# Patient Record
Sex: Male | Born: 1940
Health system: Southern US, Community
[De-identification: ages and names within clinical notes are randomized; demographics above are authoritative.]

## PROBLEM LIST (undated history)

## (undated) ENCOUNTER — Emergency Department (HOSPITAL_COMMUNITY): Payer: 59

## (undated) DIAGNOSIS — E1140 Type 2 diabetes mellitus with diabetic neuropathy, unspecified: Secondary | ICD-10-CM

## (undated) DIAGNOSIS — F5104 Psychophysiologic insomnia: Secondary | ICD-10-CM

## (undated) DIAGNOSIS — J309 Allergic rhinitis, unspecified: Secondary | ICD-10-CM

## (undated) DIAGNOSIS — E119 Type 2 diabetes mellitus without complications: Secondary | ICD-10-CM

## (undated) DIAGNOSIS — N1832 Chronic kidney disease, stage 3b: Secondary | ICD-10-CM

## (undated) DIAGNOSIS — I10 Essential (primary) hypertension: Secondary | ICD-10-CM

## (undated) DIAGNOSIS — I639 Cerebral infarction, unspecified: Secondary | ICD-10-CM

## (undated) DIAGNOSIS — E291 Testicular hypofunction: Secondary | ICD-10-CM

## (undated) DIAGNOSIS — E785 Hyperlipidemia, unspecified: Secondary | ICD-10-CM

## (undated) DIAGNOSIS — M545 Low back pain, unspecified: Secondary | ICD-10-CM

## (undated) DIAGNOSIS — Z87442 Personal history of urinary calculi: Secondary | ICD-10-CM

## (undated) DIAGNOSIS — Z01810 Encounter for preprocedural cardiovascular examination: Secondary | ICD-10-CM

## (undated) DIAGNOSIS — F419 Anxiety disorder, unspecified: Secondary | ICD-10-CM

## (undated) DIAGNOSIS — R600 Localized edema: Secondary | ICD-10-CM

## (undated) DIAGNOSIS — G20A1 Parkinson's disease without dyskinesia, without mention of fluctuations: Secondary | ICD-10-CM

## (undated) DIAGNOSIS — G8929 Other chronic pain: Secondary | ICD-10-CM

## (undated) DIAGNOSIS — N400 Enlarged prostate without lower urinary tract symptoms: Secondary | ICD-10-CM

## (undated) DIAGNOSIS — E7800 Pure hypercholesterolemia, unspecified: Secondary | ICD-10-CM

## (undated) DIAGNOSIS — I679 Cerebrovascular disease, unspecified: Secondary | ICD-10-CM

## (undated) DIAGNOSIS — J189 Pneumonia, unspecified organism: Secondary | ICD-10-CM

## (undated) DIAGNOSIS — K219 Gastro-esophageal reflux disease without esophagitis: Secondary | ICD-10-CM

## (undated) DIAGNOSIS — N390 Urinary tract infection, site not specified: Secondary | ICD-10-CM

## (undated) DIAGNOSIS — E1121 Type 2 diabetes mellitus with diabetic nephropathy: Secondary | ICD-10-CM

## (undated) DIAGNOSIS — N529 Male erectile dysfunction, unspecified: Secondary | ICD-10-CM

## (undated) DIAGNOSIS — M1990 Unspecified osteoarthritis, unspecified site: Secondary | ICD-10-CM

## (undated) DIAGNOSIS — G20 Parkinson's disease: Secondary | ICD-10-CM

## (undated) DIAGNOSIS — C4491 Basal cell carcinoma of skin, unspecified: Secondary | ICD-10-CM

## (undated) DIAGNOSIS — I2541 Coronary artery aneurysm: Secondary | ICD-10-CM

## (undated) DIAGNOSIS — M549 Dorsalgia, unspecified: Secondary | ICD-10-CM

## (undated) DIAGNOSIS — I2510 Atherosclerotic heart disease of native coronary artery without angina pectoris: Secondary | ICD-10-CM

## (undated) HISTORY — DX: Encounter for preprocedural cardiovascular examination: Z01.810

## (undated) HISTORY — DX: Dorsalgia, unspecified: M54.9

## (undated) HISTORY — DX: Unspecified osteoarthritis, unspecified site: M19.90

## (undated) HISTORY — DX: Benign prostatic hyperplasia without lower urinary tract symptoms: N40.0

## (undated) HISTORY — DX: Type 2 diabetes mellitus with diabetic nephropathy: E11.21

## (undated) HISTORY — DX: Essential (primary) hypertension: I10

## (undated) HISTORY — DX: Chronic kidney disease, stage 3b: N18.32

## (undated) HISTORY — PX: KNEE SURGERY: SHX244

## (undated) HISTORY — DX: Allergic rhinitis, unspecified: J30.9

## (undated) HISTORY — DX: Parkinson's disease: G20

## (undated) HISTORY — PX: TONSILLECTOMY: SUR1361

## (undated) HISTORY — PX: OTHER SURGICAL HISTORY: SHX169

## (undated) HISTORY — PX: HERNIA REPAIR: SHX51

## (undated) HISTORY — PX: APPENDECTOMY: SHX54

## (undated) HISTORY — DX: Parkinson's disease without dyskinesia, without mention of fluctuations: G20.A1

## (undated) HISTORY — DX: Type 2 diabetes mellitus with diabetic neuropathy, unspecified: E11.40

## (undated) HISTORY — DX: Low back pain, unspecified: M54.50

## (undated) HISTORY — DX: Testicular hypofunction: E29.1

## (undated) HISTORY — DX: Hyperlipidemia, unspecified: E78.5

## (undated) HISTORY — DX: Cerebrovascular disease, unspecified: I67.9

## (undated) HISTORY — DX: Other chronic pain: G89.29

## (undated) HISTORY — DX: Pure hypercholesterolemia, unspecified: E78.00

## (undated) HISTORY — DX: Atherosclerotic heart disease of native coronary artery without angina pectoris: I25.10

## (undated) HISTORY — DX: Male erectile dysfunction, unspecified: N52.9

## (undated) HISTORY — DX: Anxiety disorder, unspecified: F41.9

## (undated) HISTORY — PX: KIDNEY STONE SURGERY: SHX686

## (undated) HISTORY — DX: Basal cell carcinoma of skin, unspecified: C44.91

## (undated) HISTORY — DX: Type 2 diabetes mellitus without complications: E11.9

## (undated) HISTORY — DX: Gastro-esophageal reflux disease without esophagitis: K21.9

## (undated) HISTORY — DX: Psychophysiologic insomnia: F51.04

## (undated) HISTORY — DX: Localized edema: R60.0

## (undated) HISTORY — DX: Cerebral infarction, unspecified: I63.9

---

## 2000-08-04 DIAGNOSIS — I2541 Coronary artery aneurysm: Secondary | ICD-10-CM

## 2000-08-04 DIAGNOSIS — I639 Cerebral infarction, unspecified: Secondary | ICD-10-CM | POA: Insufficient documentation

## 2000-08-04 DIAGNOSIS — Z8673 Personal history of transient ischemic attack (TIA), and cerebral infarction without residual deficits: Secondary | ICD-10-CM

## 2000-08-04 HISTORY — DX: Cerebral infarction, unspecified: I63.9

## 2000-08-04 HISTORY — DX: Coronary artery aneurysm: I25.41

## 2000-08-04 HISTORY — PX: BRAIN SURGERY: SHX531

## 2000-08-04 HISTORY — DX: Personal history of transient ischemic attack (TIA), and cerebral infarction without residual deficits: Z86.73

## 2001-01-08 ENCOUNTER — Ambulatory Visit (HOSPITAL_COMMUNITY): Admission: RE | Admit: 2001-01-08 | Discharge: 2001-01-08 | Payer: Self-pay

## 2001-01-13 ENCOUNTER — Inpatient Hospital Stay (HOSPITAL_COMMUNITY)
Admission: RE | Admit: 2001-01-13 | Discharge: 2001-01-25 | Payer: Self-pay | Admitting: Physical Medicine & Rehabilitation

## 2001-01-15 ENCOUNTER — Encounter: Payer: Self-pay | Admitting: Physical Medicine & Rehabilitation

## 2001-03-28 ENCOUNTER — Ambulatory Visit (HOSPITAL_COMMUNITY): Admission: RE | Admit: 2001-03-28 | Discharge: 2001-03-28 | Payer: Self-pay

## 2006-08-04 HISTORY — PX: ROTATOR CUFF REPAIR: SHX139

## 2011-12-26 DIAGNOSIS — R509 Fever, unspecified: Secondary | ICD-10-CM | POA: Diagnosis not present

## 2011-12-26 DIAGNOSIS — N12 Tubulo-interstitial nephritis, not specified as acute or chronic: Secondary | ICD-10-CM | POA: Diagnosis not present

## 2012-01-05 DIAGNOSIS — Z8051 Family history of malignant neoplasm of kidney: Secondary | ICD-10-CM | POA: Diagnosis not present

## 2012-01-05 DIAGNOSIS — N401 Enlarged prostate with lower urinary tract symptoms: Secondary | ICD-10-CM | POA: Diagnosis not present

## 2012-01-05 DIAGNOSIS — N402 Nodular prostate without lower urinary tract symptoms: Secondary | ICD-10-CM | POA: Diagnosis not present

## 2012-01-05 DIAGNOSIS — N12 Tubulo-interstitial nephritis, not specified as acute or chronic: Secondary | ICD-10-CM | POA: Diagnosis not present

## 2012-01-08 DIAGNOSIS — N201 Calculus of ureter: Secondary | ICD-10-CM | POA: Diagnosis not present

## 2012-01-08 DIAGNOSIS — R109 Unspecified abdominal pain: Secondary | ICD-10-CM | POA: Diagnosis not present

## 2012-01-09 DIAGNOSIS — N201 Calculus of ureter: Secondary | ICD-10-CM | POA: Diagnosis not present

## 2012-01-09 DIAGNOSIS — N1330 Unspecified hydronephrosis: Secondary | ICD-10-CM | POA: Diagnosis not present

## 2012-01-09 DIAGNOSIS — R109 Unspecified abdominal pain: Secondary | ICD-10-CM | POA: Diagnosis not present

## 2012-01-14 DIAGNOSIS — N201 Calculus of ureter: Secondary | ICD-10-CM | POA: Diagnosis not present

## 2012-01-14 DIAGNOSIS — R109 Unspecified abdominal pain: Secondary | ICD-10-CM | POA: Diagnosis not present

## 2012-01-30 DIAGNOSIS — R109 Unspecified abdominal pain: Secondary | ICD-10-CM | POA: Diagnosis not present

## 2012-01-30 DIAGNOSIS — N201 Calculus of ureter: Secondary | ICD-10-CM | POA: Diagnosis not present

## 2012-05-26 DIAGNOSIS — I2510 Atherosclerotic heart disease of native coronary artery without angina pectoris: Secondary | ICD-10-CM | POA: Diagnosis not present

## 2012-05-26 DIAGNOSIS — E7800 Pure hypercholesterolemia, unspecified: Secondary | ICD-10-CM | POA: Diagnosis not present

## 2012-05-26 DIAGNOSIS — Z23 Encounter for immunization: Secondary | ICD-10-CM | POA: Diagnosis not present

## 2012-05-26 DIAGNOSIS — M25559 Pain in unspecified hip: Secondary | ICD-10-CM | POA: Diagnosis not present

## 2012-05-26 DIAGNOSIS — K219 Gastro-esophageal reflux disease without esophagitis: Secondary | ICD-10-CM | POA: Diagnosis not present

## 2012-05-26 DIAGNOSIS — Z6830 Body mass index (BMI) 30.0-30.9, adult: Secondary | ICD-10-CM | POA: Diagnosis not present

## 2012-06-28 DIAGNOSIS — E119 Type 2 diabetes mellitus without complications: Secondary | ICD-10-CM | POA: Diagnosis not present

## 2012-06-28 DIAGNOSIS — I2510 Atherosclerotic heart disease of native coronary artery without angina pectoris: Secondary | ICD-10-CM | POA: Diagnosis not present

## 2012-06-28 DIAGNOSIS — E7800 Pure hypercholesterolemia, unspecified: Secondary | ICD-10-CM | POA: Diagnosis not present

## 2012-06-28 DIAGNOSIS — Z6830 Body mass index (BMI) 30.0-30.9, adult: Secondary | ICD-10-CM | POA: Diagnosis not present

## 2012-07-06 DIAGNOSIS — J209 Acute bronchitis, unspecified: Secondary | ICD-10-CM | POA: Diagnosis not present

## 2012-07-06 DIAGNOSIS — I2510 Atherosclerotic heart disease of native coronary artery without angina pectoris: Secondary | ICD-10-CM | POA: Diagnosis not present

## 2012-07-06 DIAGNOSIS — Z6830 Body mass index (BMI) 30.0-30.9, adult: Secondary | ICD-10-CM | POA: Diagnosis not present

## 2012-07-06 DIAGNOSIS — E119 Type 2 diabetes mellitus without complications: Secondary | ICD-10-CM | POA: Diagnosis not present

## 2012-07-30 DIAGNOSIS — Z6830 Body mass index (BMI) 30.0-30.9, adult: Secondary | ICD-10-CM | POA: Diagnosis not present

## 2012-07-30 DIAGNOSIS — E119 Type 2 diabetes mellitus without complications: Secondary | ICD-10-CM | POA: Diagnosis not present

## 2012-07-30 DIAGNOSIS — M25559 Pain in unspecified hip: Secondary | ICD-10-CM | POA: Diagnosis not present

## 2012-07-30 DIAGNOSIS — E7800 Pure hypercholesterolemia, unspecified: Secondary | ICD-10-CM | POA: Diagnosis not present

## 2012-07-30 DIAGNOSIS — I2510 Atherosclerotic heart disease of native coronary artery without angina pectoris: Secondary | ICD-10-CM | POA: Diagnosis not present

## 2012-10-21 DIAGNOSIS — N401 Enlarged prostate with lower urinary tract symptoms: Secondary | ICD-10-CM | POA: Diagnosis not present

## 2012-10-21 DIAGNOSIS — I10 Essential (primary) hypertension: Secondary | ICD-10-CM | POA: Diagnosis not present

## 2012-11-03 DIAGNOSIS — L578 Other skin changes due to chronic exposure to nonionizing radiation: Secondary | ICD-10-CM | POA: Diagnosis not present

## 2012-11-03 DIAGNOSIS — C4441 Basal cell carcinoma of skin of scalp and neck: Secondary | ICD-10-CM | POA: Diagnosis not present

## 2012-11-10 DIAGNOSIS — C4441 Basal cell carcinoma of skin of scalp and neck: Secondary | ICD-10-CM | POA: Diagnosis not present

## 2012-11-11 DIAGNOSIS — M48061 Spinal stenosis, lumbar region without neurogenic claudication: Secondary | ICD-10-CM | POA: Diagnosis not present

## 2012-11-29 DIAGNOSIS — L570 Actinic keratosis: Secondary | ICD-10-CM | POA: Diagnosis not present

## 2013-01-18 DIAGNOSIS — E7800 Pure hypercholesterolemia, unspecified: Secondary | ICD-10-CM | POA: Diagnosis not present

## 2013-01-18 DIAGNOSIS — E119 Type 2 diabetes mellitus without complications: Secondary | ICD-10-CM | POA: Diagnosis not present

## 2013-01-18 DIAGNOSIS — K219 Gastro-esophageal reflux disease without esophagitis: Secondary | ICD-10-CM | POA: Diagnosis not present

## 2013-01-18 DIAGNOSIS — M25559 Pain in unspecified hip: Secondary | ICD-10-CM | POA: Diagnosis not present

## 2013-01-18 DIAGNOSIS — I2510 Atherosclerotic heart disease of native coronary artery without angina pectoris: Secondary | ICD-10-CM | POA: Diagnosis not present

## 2013-01-18 DIAGNOSIS — I10 Essential (primary) hypertension: Secondary | ICD-10-CM | POA: Diagnosis not present

## 2013-02-10 DIAGNOSIS — M779 Enthesopathy, unspecified: Secondary | ICD-10-CM | POA: Diagnosis not present

## 2013-02-10 DIAGNOSIS — M2040 Other hammer toe(s) (acquired), unspecified foot: Secondary | ICD-10-CM | POA: Diagnosis not present

## 2013-02-28 DIAGNOSIS — D235 Other benign neoplasm of skin of trunk: Secondary | ICD-10-CM | POA: Diagnosis not present

## 2013-02-28 DIAGNOSIS — L570 Actinic keratosis: Secondary | ICD-10-CM | POA: Diagnosis not present

## 2013-03-04 DIAGNOSIS — Z6831 Body mass index (BMI) 31.0-31.9, adult: Secondary | ICD-10-CM | POA: Diagnosis not present

## 2013-03-04 DIAGNOSIS — Z1331 Encounter for screening for depression: Secondary | ICD-10-CM | POA: Diagnosis not present

## 2013-03-04 DIAGNOSIS — E119 Type 2 diabetes mellitus without complications: Secondary | ICD-10-CM | POA: Diagnosis not present

## 2013-03-04 DIAGNOSIS — Z9181 History of falling: Secondary | ICD-10-CM | POA: Diagnosis not present

## 2013-03-04 DIAGNOSIS — E7800 Pure hypercholesterolemia, unspecified: Secondary | ICD-10-CM | POA: Diagnosis not present

## 2013-03-04 DIAGNOSIS — I2510 Atherosclerotic heart disease of native coronary artery without angina pectoris: Secondary | ICD-10-CM | POA: Diagnosis not present

## 2013-03-11 DIAGNOSIS — E7800 Pure hypercholesterolemia, unspecified: Secondary | ICD-10-CM | POA: Diagnosis not present

## 2013-03-11 DIAGNOSIS — I2510 Atherosclerotic heart disease of native coronary artery without angina pectoris: Secondary | ICD-10-CM | POA: Diagnosis not present

## 2013-03-11 DIAGNOSIS — Z6831 Body mass index (BMI) 31.0-31.9, adult: Secondary | ICD-10-CM | POA: Diagnosis not present

## 2013-03-11 DIAGNOSIS — I6789 Other cerebrovascular disease: Secondary | ICD-10-CM | POA: Diagnosis not present

## 2013-03-11 DIAGNOSIS — M25559 Pain in unspecified hip: Secondary | ICD-10-CM | POA: Diagnosis not present

## 2013-03-16 DIAGNOSIS — E1149 Type 2 diabetes mellitus with other diabetic neurological complication: Secondary | ICD-10-CM | POA: Diagnosis not present

## 2013-03-16 DIAGNOSIS — R269 Unspecified abnormalities of gait and mobility: Secondary | ICD-10-CM | POA: Diagnosis not present

## 2013-03-16 DIAGNOSIS — M2040 Other hammer toe(s) (acquired), unspecified foot: Secondary | ICD-10-CM | POA: Diagnosis not present

## 2013-03-16 DIAGNOSIS — M779 Enthesopathy, unspecified: Secondary | ICD-10-CM | POA: Diagnosis not present

## 2013-03-18 DIAGNOSIS — E7800 Pure hypercholesterolemia, unspecified: Secondary | ICD-10-CM | POA: Diagnosis not present

## 2013-03-18 DIAGNOSIS — I2510 Atherosclerotic heart disease of native coronary artery without angina pectoris: Secondary | ICD-10-CM | POA: Diagnosis not present

## 2013-03-18 DIAGNOSIS — K219 Gastro-esophageal reflux disease without esophagitis: Secondary | ICD-10-CM | POA: Diagnosis not present

## 2013-03-18 DIAGNOSIS — Z6830 Body mass index (BMI) 30.0-30.9, adult: Secondary | ICD-10-CM | POA: Diagnosis not present

## 2013-03-18 DIAGNOSIS — M25569 Pain in unspecified knee: Secondary | ICD-10-CM | POA: Diagnosis not present

## 2013-04-20 DIAGNOSIS — Z6830 Body mass index (BMI) 30.0-30.9, adult: Secondary | ICD-10-CM | POA: Diagnosis not present

## 2013-04-20 DIAGNOSIS — I2510 Atherosclerotic heart disease of native coronary artery without angina pectoris: Secondary | ICD-10-CM | POA: Diagnosis not present

## 2013-04-20 DIAGNOSIS — E7800 Pure hypercholesterolemia, unspecified: Secondary | ICD-10-CM | POA: Diagnosis not present

## 2013-04-20 DIAGNOSIS — K219 Gastro-esophageal reflux disease without esophagitis: Secondary | ICD-10-CM | POA: Diagnosis not present

## 2013-05-20 DIAGNOSIS — M25559 Pain in unspecified hip: Secondary | ICD-10-CM | POA: Diagnosis not present

## 2013-05-20 DIAGNOSIS — K219 Gastro-esophageal reflux disease without esophagitis: Secondary | ICD-10-CM | POA: Diagnosis not present

## 2013-05-20 DIAGNOSIS — E7800 Pure hypercholesterolemia, unspecified: Secondary | ICD-10-CM | POA: Diagnosis not present

## 2013-05-20 DIAGNOSIS — I2510 Atherosclerotic heart disease of native coronary artery without angina pectoris: Secondary | ICD-10-CM | POA: Diagnosis not present

## 2013-05-20 DIAGNOSIS — Z6830 Body mass index (BMI) 30.0-30.9, adult: Secondary | ICD-10-CM | POA: Diagnosis not present

## 2013-05-20 DIAGNOSIS — Z23 Encounter for immunization: Secondary | ICD-10-CM | POA: Diagnosis not present

## 2013-05-25 ENCOUNTER — Encounter (INDEPENDENT_AMBULATORY_CARE_PROVIDER_SITE_OTHER): Payer: Self-pay

## 2013-05-25 ENCOUNTER — Ambulatory Visit (INDEPENDENT_AMBULATORY_CARE_PROVIDER_SITE_OTHER): Payer: Managed Care, Other (non HMO)

## 2013-05-25 VITALS — BP 130/80 | HR 109 | Resp 16

## 2013-05-25 DIAGNOSIS — E1140 Type 2 diabetes mellitus with diabetic neuropathy, unspecified: Secondary | ICD-10-CM

## 2013-05-25 DIAGNOSIS — M2040 Other hammer toe(s) (acquired), unspecified foot: Secondary | ICD-10-CM

## 2013-05-25 DIAGNOSIS — E1149 Type 2 diabetes mellitus with other diabetic neurological complication: Secondary | ICD-10-CM

## 2013-05-25 DIAGNOSIS — Q828 Other specified congenital malformations of skin: Secondary | ICD-10-CM

## 2013-05-25 DIAGNOSIS — E1142 Type 2 diabetes mellitus with diabetic polyneuropathy: Secondary | ICD-10-CM

## 2013-05-25 DIAGNOSIS — M2170 Unequal limb length (acquired), unspecified site: Secondary | ICD-10-CM

## 2013-05-25 DIAGNOSIS — R269 Unspecified abnormalities of gait and mobility: Secondary | ICD-10-CM

## 2013-05-25 NOTE — Progress Notes (Signed)
  Subjective:    Patient ID: Micheal [name], male    DOB: 1940-09-18, 72 y.o.   MRN: [id number]  Diabetes He presents for his follow-up diabetic visit. He has type 2 diabetes mellitus. No MedicAlert identification noted.   patient presents for followup and pick up and dispensing of diabetic accident shoes and custom molded insoles with lifts for left foot. Patient does have limited discrepancy left side, with abnormality gait dropfoot deformity.    Review of Systems  Constitutional: Negative.   HENT: Negative.   Eyes: Negative.   Respiratory: Negative.   Cardiovascular: Negative.   Gastrointestinal: Negative.   Endocrine: Negative.   Genitourinary: Negative.   Musculoskeletal: Positive for back pain.  Skin: Negative.   Allergic/Immunologic: Negative.   Neurological: Negative.   Hematological: Bruises/bleeds easily.  Psychiatric/Behavioral: Negative.        Objective:   Physical Exam  Vitals reviewed. Constitutional: He is oriented to person, place, and time. He appears well-developed and well-nourished.  Cardiovascular:  Pulses:      Dorsalis pedis pulses are 2+ on the right side, and 2+ on the left side.       Posterior tibial pulses are 1+ on the right side, and 1+ on the left side.  Capillary refill 3 seconds all digits. Skin temperature warm. Turgor normal. No edema varicosities noted. No open wounds or ulcerations the current time to  Musculoskeletal:  Biomechanical exam bilateral reveals the cavovarus type foot type with digital contractures and clawing contractures of toes as abnormality and gait bilateral with limb length discrepancy left leg being shorter than the right by approximately half inch to an inch. Patient is history of back problems back pain.  Neurological: He is alert and oriented to person, place, and time. He displays atrophy. He exhibits abnormal muscle tone.  Neurologically epicritic sensations are diminished on Semmes Weinstein testing to the  toes and forefoot bilateral. There is normal plantar response bilateral. DTRs not listed. There is abnormal gait with decreased muscle tone left side and dropfoot deformity left side.  Skin: Skin is warm and dry. No cyanosis. Nails show no clubbing.  Skin color and pigment normal hair growth absent bilateral. Nails somewhat thickened and criptotic with discoloration. No open wounds or current ulcerations noted. History of keratoses secondary deformity and gait abnormalities.  Psychiatric: He has a normal mood and affect. His behavior is normal.          Assessment & Plan:  Diabetes with peripheral neuropathy and orthopedic abnormalities/biomechanical deformities. Only discrepancy left side being shorter than the right. At this time dispensed 1 pair extra-depth shoes 3 pairs of custom molded dual density Plastizote inlays. Also half-inch a heel lift is applied to the left shoes. The stenosis with patient's gait. Patient is advised to use and use of orthoses and shoes with a slow steady break in period. Followup in one to 2 months for reevaluation and possible adjustments of the insoles and shoes. Advised to monitor for any keratoses or shoe irritation.  Harriet Masson DPM

## 2013-05-25 NOTE — Patient Instructions (Signed)

## 2013-06-22 DIAGNOSIS — Z6831 Body mass index (BMI) 31.0-31.9, adult: Secondary | ICD-10-CM | POA: Diagnosis not present

## 2013-06-22 DIAGNOSIS — I2510 Atherosclerotic heart disease of native coronary artery without angina pectoris: Secondary | ICD-10-CM | POA: Diagnosis not present

## 2013-06-22 DIAGNOSIS — Z23 Encounter for immunization: Secondary | ICD-10-CM | POA: Diagnosis not present

## 2013-06-22 DIAGNOSIS — E7800 Pure hypercholesterolemia, unspecified: Secondary | ICD-10-CM | POA: Diagnosis not present

## 2013-06-22 DIAGNOSIS — I6789 Other cerebrovascular disease: Secondary | ICD-10-CM | POA: Diagnosis not present

## 2013-07-12 DIAGNOSIS — E119 Type 2 diabetes mellitus without complications: Secondary | ICD-10-CM | POA: Diagnosis not present

## 2013-07-20 ENCOUNTER — Ambulatory Visit (INDEPENDENT_AMBULATORY_CARE_PROVIDER_SITE_OTHER): Payer: Managed Care, Other (non HMO)

## 2013-07-20 VITALS — BP 137/81 | HR 90 | Resp 18

## 2013-07-20 DIAGNOSIS — R269 Unspecified abnormalities of gait and mobility: Secondary | ICD-10-CM | POA: Diagnosis not present

## 2013-07-20 DIAGNOSIS — E1142 Type 2 diabetes mellitus with diabetic polyneuropathy: Secondary | ICD-10-CM

## 2013-07-20 DIAGNOSIS — M2170 Unequal limb length (acquired), unspecified site: Secondary | ICD-10-CM

## 2013-07-20 DIAGNOSIS — E1149 Type 2 diabetes mellitus with other diabetic neurological complication: Secondary | ICD-10-CM

## 2013-07-20 DIAGNOSIS — E1140 Type 2 diabetes mellitus with diabetic neuropathy, unspecified: Secondary | ICD-10-CM

## 2013-07-20 DIAGNOSIS — M1990 Unspecified osteoarthritis, unspecified site: Secondary | ICD-10-CM

## 2013-07-20 MED ORDER — MELOXICAM 15 MG PO TABS: 15.0000 mg | ORAL_TABLET | Freq: Every day | ORAL | Status: DC

## 2013-07-20 NOTE — Progress Notes (Signed)
   Subjective:    Patient ID: Micheal [name], male    DOB: November 22, 1940, 72 y.o.   MRN: [id number]  HPI These shoes are hurting me now for about a month and when I wear them it hurts my knees and I cant sleep at night and and use a ice and hot pack and If I wear them for more than one day I am in the bed the next day    Review of Systems no changes at this time     Objective:   Physical Exam Neurovascular status is intact and unchanged DP postal for PT one over 4 bilateral Refill time 3 seconds all digits patient is having some pain last 2-3 weeks we started wearing his new diabetic shoes affecting his knee is a limb length discrepancy proxy [place] half-inch left being shorter than right. At this time is not having pain in his feet in fact the pain is affecting his knees and legs with his walking ambulation. However explained the patient with the coconut winter weather 7 may also be a factor in causing symptomology. Patient is also certain whether has been taking his Mobic for meloxicam for arthritis.      Assessment & Plan:  No irritation to the foot from the shoes however having some difficulty with adjustment of the stiffness of the shoe changes and gait with a new shoe and the left did provide extra length to prevent 2 bearings other shoes recycling from a lifting shoe to a flat shoe causes strain on his knees. Patient advised to give it one more time for break-in break in instruction sheet is given for orthotics and shoes at this time for him to follow. A new prescription for Mobic or meloxicam is also issued should he run out of his prescription. Followup in one month he continues to have pain in symptomology.  Harriet Masson DPM

## 2013-07-20 NOTE — Patient Instructions (Signed)
WEARING INSTRUCTIONS FOR ORTHOTICS or new diabetic shoes  Don't expect to be comfortable wearing your orthotic devices for the first time.  Like eyeglasses, you may be aware of them as time passes, they will not be uncomfortable and you will enjoy wearing them.  FOLLOW THESE INSTRUCTIONS EXACTLY!  1. Wear your orthotic devices for:       Not more than 1 hour the first day.       Not more than 2 hours the second day.       Not more than 3 hours the third day and so on.        Or wear them for as long as they feel comfortable.       If you experience discomfort in your feet or legs take them out.  When feet & legs feel       better, put them back in.  You do need to be consistent and wear them a little        everyday. 2.   If at any time the orthotic devices become acutely uncomfortable before the       time for that particular day, STOP WEARING THEM. 3.   On the next day, do not increase the wearing time. 4.   Subsequently, increase the wearing time by 15-30 minutes only if comfortable to do       so. 5.   You will be seen by your doctor about 2-4 weeks after you receive your orthotic       devices, at which time you will probably be wearing your devices comfortably        for about 8 hours or more a day. 6.   Some patients occasionally report mild aches or discomfort in other parts of the of       body such as the knees, hips or back after 3 or 4 consecutive hours of wear.  If this       is the case with you, do not extend your wearing time.  Instead, cut it back an hour or       two.  In all likelihood, these symptoms will disappear in a short period of time as your       body posture realigns itself and functions more efficiently. 7.   It is possible that your orthotic device may require some small changes or adjustment       to improve their function or make them more comfortable.   This is usually not done       before one to three months have elapsed.  These adjustments are made in         accordance with the changed position your feet are assuming as a result of       improved biomechanical function. 8.   In women's shoes, it's not unusual for your heel to slip out of the shoe, particularly if       they are step-in-shoes.  If this is the case, try other shoes or other styles.  Try to       purchase shoes which have deeper heal seats or higher heel counters. 9.   Squeaking of orthotics devices in the shoes is due to the movement of the devices       when they are functioning normally.  To eliminate squeaking, simply dust some       baby powder into your shoes before inserting the devices.  If this does not work,  apply soap or wax to the edges of the orthotic devices or put a tissue into the shoes. 10. It is important that you follow these directions explicitly.  Failure to do so will simply       prolong the adjustment period or create problems which are easily avoided.  It makes       no difference if you are wearing your orthotic devices for only a few hours after        several months, so long as you are wearing them comfortably for those hours. 11. If you have any questions or complaints, contact our office.  We have no way of       knowing about your problems unless you tell us.  If we do not hear from you, we will       assume that you are proceeding well.  

## 2013-08-03 DIAGNOSIS — M545 Low back pain: Secondary | ICD-10-CM | POA: Diagnosis not present

## 2013-08-03 DIAGNOSIS — M1990 Unspecified osteoarthritis, unspecified site: Secondary | ICD-10-CM | POA: Diagnosis not present

## 2013-08-03 DIAGNOSIS — E7800 Pure hypercholesterolemia, unspecified: Secondary | ICD-10-CM | POA: Diagnosis not present

## 2013-08-03 DIAGNOSIS — M25569 Pain in unspecified knee: Secondary | ICD-10-CM | POA: Diagnosis not present

## 2013-08-03 DIAGNOSIS — I10 Essential (primary) hypertension: Secondary | ICD-10-CM | POA: Diagnosis not present

## 2013-08-03 DIAGNOSIS — Z23 Encounter for immunization: Secondary | ICD-10-CM | POA: Diagnosis not present

## 2013-08-03 DIAGNOSIS — I2510 Atherosclerotic heart disease of native coronary artery without angina pectoris: Secondary | ICD-10-CM | POA: Diagnosis not present

## 2013-08-03 DIAGNOSIS — E119 Type 2 diabetes mellitus without complications: Secondary | ICD-10-CM | POA: Diagnosis not present

## 2013-10-28 DIAGNOSIS — E119 Type 2 diabetes mellitus without complications: Secondary | ICD-10-CM | POA: Diagnosis not present

## 2013-10-28 DIAGNOSIS — E7800 Pure hypercholesterolemia, unspecified: Secondary | ICD-10-CM | POA: Diagnosis not present

## 2013-10-28 DIAGNOSIS — I2510 Atherosclerotic heart disease of native coronary artery without angina pectoris: Secondary | ICD-10-CM | POA: Diagnosis not present

## 2013-10-28 DIAGNOSIS — K219 Gastro-esophageal reflux disease without esophagitis: Secondary | ICD-10-CM | POA: Diagnosis not present

## 2013-10-28 DIAGNOSIS — I10 Essential (primary) hypertension: Secondary | ICD-10-CM | POA: Diagnosis not present

## 2013-10-28 DIAGNOSIS — M25559 Pain in unspecified hip: Secondary | ICD-10-CM | POA: Diagnosis not present

## 2014-01-04 DIAGNOSIS — S46819A Strain of other muscles, fascia and tendons at shoulder and upper arm level, unspecified arm, initial encounter: Secondary | ICD-10-CM | POA: Diagnosis not present

## 2014-01-04 DIAGNOSIS — S43499A Other sprain of unspecified shoulder joint, initial encounter: Secondary | ICD-10-CM | POA: Diagnosis not present

## 2014-01-04 DIAGNOSIS — M25519 Pain in unspecified shoulder: Secondary | ICD-10-CM | POA: Diagnosis not present

## 2014-01-30 DIAGNOSIS — N400 Enlarged prostate without lower urinary tract symptoms: Secondary | ICD-10-CM | POA: Diagnosis not present

## 2014-01-30 DIAGNOSIS — I10 Essential (primary) hypertension: Secondary | ICD-10-CM | POA: Diagnosis not present

## 2014-01-30 DIAGNOSIS — B0229 Other postherpetic nervous system involvement: Secondary | ICD-10-CM | POA: Diagnosis not present

## 2014-01-30 DIAGNOSIS — E7800 Pure hypercholesterolemia, unspecified: Secondary | ICD-10-CM | POA: Diagnosis not present

## 2014-01-30 DIAGNOSIS — M159 Polyosteoarthritis, unspecified: Secondary | ICD-10-CM | POA: Diagnosis not present

## 2014-01-30 DIAGNOSIS — E119 Type 2 diabetes mellitus without complications: Secondary | ICD-10-CM | POA: Diagnosis not present

## 2014-02-06 DIAGNOSIS — E119 Type 2 diabetes mellitus without complications: Secondary | ICD-10-CM | POA: Diagnosis not present

## 2014-02-06 DIAGNOSIS — R259 Unspecified abnormal involuntary movements: Secondary | ICD-10-CM | POA: Diagnosis not present

## 2014-02-06 DIAGNOSIS — E7800 Pure hypercholesterolemia, unspecified: Secondary | ICD-10-CM | POA: Diagnosis not present

## 2014-02-06 DIAGNOSIS — I10 Essential (primary) hypertension: Secondary | ICD-10-CM | POA: Diagnosis not present

## 2014-02-06 DIAGNOSIS — M25559 Pain in unspecified hip: Secondary | ICD-10-CM | POA: Diagnosis not present

## 2014-02-07 DIAGNOSIS — M1990 Unspecified osteoarthritis, unspecified site: Secondary | ICD-10-CM | POA: Diagnosis not present

## 2014-02-20 DIAGNOSIS — S43499A Other sprain of unspecified shoulder joint, initial encounter: Secondary | ICD-10-CM | POA: Diagnosis not present

## 2014-02-20 DIAGNOSIS — M25519 Pain in unspecified shoulder: Secondary | ICD-10-CM | POA: Diagnosis not present

## 2014-02-27 DIAGNOSIS — I10 Essential (primary) hypertension: Secondary | ICD-10-CM | POA: Diagnosis not present

## 2014-02-27 DIAGNOSIS — E119 Type 2 diabetes mellitus without complications: Secondary | ICD-10-CM | POA: Diagnosis not present

## 2014-02-27 DIAGNOSIS — I2510 Atherosclerotic heart disease of native coronary artery without angina pectoris: Secondary | ICD-10-CM | POA: Diagnosis not present

## 2014-02-27 DIAGNOSIS — E7800 Pure hypercholesterolemia, unspecified: Secondary | ICD-10-CM | POA: Diagnosis not present

## 2014-03-15 DIAGNOSIS — I6350 Cerebral infarction due to unspecified occlusion or stenosis of unspecified cerebral artery: Secondary | ICD-10-CM | POA: Diagnosis not present

## 2014-03-15 DIAGNOSIS — R259 Unspecified abnormal involuntary movements: Secondary | ICD-10-CM | POA: Diagnosis not present

## 2014-03-30 DIAGNOSIS — I6350 Cerebral infarction due to unspecified occlusion or stenosis of unspecified cerebral artery: Secondary | ICD-10-CM | POA: Diagnosis not present

## 2014-03-30 DIAGNOSIS — R259 Unspecified abnormal involuntary movements: Secondary | ICD-10-CM | POA: Diagnosis not present

## 2014-04-01 DIAGNOSIS — E119 Type 2 diabetes mellitus without complications: Secondary | ICD-10-CM | POA: Diagnosis not present

## 2014-04-01 DIAGNOSIS — E7800 Pure hypercholesterolemia, unspecified: Secondary | ICD-10-CM | POA: Diagnosis not present

## 2014-04-01 DIAGNOSIS — I10 Essential (primary) hypertension: Secondary | ICD-10-CM | POA: Diagnosis not present

## 2014-04-01 DIAGNOSIS — J209 Acute bronchitis, unspecified: Secondary | ICD-10-CM | POA: Diagnosis not present

## 2014-04-20 DIAGNOSIS — E7800 Pure hypercholesterolemia, unspecified: Secondary | ICD-10-CM | POA: Diagnosis not present

## 2014-04-20 DIAGNOSIS — E119 Type 2 diabetes mellitus without complications: Secondary | ICD-10-CM | POA: Diagnosis not present

## 2014-04-20 DIAGNOSIS — J209 Acute bronchitis, unspecified: Secondary | ICD-10-CM | POA: Diagnosis not present

## 2014-04-20 DIAGNOSIS — I2510 Atherosclerotic heart disease of native coronary artery without angina pectoris: Secondary | ICD-10-CM | POA: Diagnosis not present

## 2014-05-11 DIAGNOSIS — Z1389 Encounter for screening for other disorder: Secondary | ICD-10-CM | POA: Diagnosis not present

## 2014-05-11 DIAGNOSIS — E119 Type 2 diabetes mellitus without complications: Secondary | ICD-10-CM | POA: Diagnosis not present

## 2014-05-11 DIAGNOSIS — I2510 Atherosclerotic heart disease of native coronary artery without angina pectoris: Secondary | ICD-10-CM | POA: Diagnosis not present

## 2014-05-11 DIAGNOSIS — I6789 Other cerebrovascular disease: Secondary | ICD-10-CM | POA: Diagnosis not present

## 2014-05-11 DIAGNOSIS — Z9181 History of falling: Secondary | ICD-10-CM | POA: Diagnosis not present

## 2014-05-11 DIAGNOSIS — E780 Pure hypercholesterolemia: Secondary | ICD-10-CM | POA: Diagnosis not present

## 2014-05-25 DIAGNOSIS — M5417 Radiculopathy, lumbosacral region: Secondary | ICD-10-CM | POA: Diagnosis not present

## 2014-05-25 DIAGNOSIS — M47896 Other spondylosis, lumbar region: Secondary | ICD-10-CM | POA: Diagnosis not present

## 2014-06-01 DIAGNOSIS — E119 Type 2 diabetes mellitus without complications: Secondary | ICD-10-CM | POA: Diagnosis not present

## 2014-06-01 DIAGNOSIS — I10 Essential (primary) hypertension: Secondary | ICD-10-CM | POA: Diagnosis not present

## 2014-06-01 DIAGNOSIS — Z23 Encounter for immunization: Secondary | ICD-10-CM | POA: Diagnosis not present

## 2014-06-01 DIAGNOSIS — I2510 Atherosclerotic heart disease of native coronary artery without angina pectoris: Secondary | ICD-10-CM | POA: Diagnosis not present

## 2014-06-01 DIAGNOSIS — M159 Polyosteoarthritis, unspecified: Secondary | ICD-10-CM | POA: Diagnosis not present

## 2014-06-01 DIAGNOSIS — K219 Gastro-esophageal reflux disease without esophagitis: Secondary | ICD-10-CM | POA: Diagnosis not present

## 2014-06-01 DIAGNOSIS — I6789 Other cerebrovascular disease: Secondary | ICD-10-CM | POA: Diagnosis not present

## 2014-06-01 DIAGNOSIS — E780 Pure hypercholesterolemia: Secondary | ICD-10-CM | POA: Diagnosis not present

## 2014-06-08 DIAGNOSIS — M545 Low back pain: Secondary | ICD-10-CM | POA: Diagnosis not present

## 2014-06-22 DIAGNOSIS — M5417 Radiculopathy, lumbosacral region: Secondary | ICD-10-CM | POA: Diagnosis not present

## 2014-06-22 DIAGNOSIS — G8929 Other chronic pain: Secondary | ICD-10-CM | POA: Diagnosis not present

## 2014-06-22 DIAGNOSIS — M549 Dorsalgia, unspecified: Secondary | ICD-10-CM | POA: Diagnosis not present

## 2014-06-22 DIAGNOSIS — M47816 Spondylosis without myelopathy or radiculopathy, lumbar region: Secondary | ICD-10-CM | POA: Diagnosis not present

## 2014-06-22 DIAGNOSIS — M4806 Spinal stenosis, lumbar region: Secondary | ICD-10-CM | POA: Diagnosis not present

## 2014-07-07 DIAGNOSIS — C44319 Basal cell carcinoma of skin of other parts of face: Secondary | ICD-10-CM | POA: Diagnosis not present

## 2014-07-07 DIAGNOSIS — L821 Other seborrheic keratosis: Secondary | ICD-10-CM | POA: Diagnosis not present

## 2014-07-07 DIAGNOSIS — L578 Other skin changes due to chronic exposure to nonionizing radiation: Secondary | ICD-10-CM | POA: Diagnosis not present

## 2014-07-13 DIAGNOSIS — C44319 Basal cell carcinoma of skin of other parts of face: Secondary | ICD-10-CM | POA: Diagnosis not present

## 2014-07-21 DIAGNOSIS — I2510 Atherosclerotic heart disease of native coronary artery without angina pectoris: Secondary | ICD-10-CM | POA: Diagnosis not present

## 2014-07-21 DIAGNOSIS — E780 Pure hypercholesterolemia: Secondary | ICD-10-CM | POA: Diagnosis not present

## 2014-07-21 DIAGNOSIS — B0229 Other postherpetic nervous system involvement: Secondary | ICD-10-CM | POA: Diagnosis not present

## 2014-07-21 DIAGNOSIS — J209 Acute bronchitis, unspecified: Secondary | ICD-10-CM | POA: Diagnosis not present

## 2014-07-21 DIAGNOSIS — E119 Type 2 diabetes mellitus without complications: Secondary | ICD-10-CM | POA: Diagnosis not present

## 2014-07-21 DIAGNOSIS — K219 Gastro-esophageal reflux disease without esophagitis: Secondary | ICD-10-CM | POA: Diagnosis not present

## 2014-07-21 DIAGNOSIS — M159 Polyosteoarthritis, unspecified: Secondary | ICD-10-CM | POA: Diagnosis not present

## 2014-07-21 DIAGNOSIS — I6789 Other cerebrovascular disease: Secondary | ICD-10-CM | POA: Diagnosis not present

## 2014-08-01 DIAGNOSIS — I2510 Atherosclerotic heart disease of native coronary artery without angina pectoris: Secondary | ICD-10-CM | POA: Diagnosis not present

## 2014-08-01 DIAGNOSIS — I6789 Other cerebrovascular disease: Secondary | ICD-10-CM | POA: Diagnosis not present

## 2014-08-01 DIAGNOSIS — M159 Polyosteoarthritis, unspecified: Secondary | ICD-10-CM | POA: Diagnosis not present

## 2014-08-01 DIAGNOSIS — N400 Enlarged prostate without lower urinary tract symptoms: Secondary | ICD-10-CM | POA: Diagnosis not present

## 2014-08-01 DIAGNOSIS — E780 Pure hypercholesterolemia: Secondary | ICD-10-CM | POA: Diagnosis not present

## 2014-08-01 DIAGNOSIS — E119 Type 2 diabetes mellitus without complications: Secondary | ICD-10-CM | POA: Diagnosis not present

## 2014-08-01 DIAGNOSIS — B0229 Other postherpetic nervous system involvement: Secondary | ICD-10-CM | POA: Diagnosis not present

## 2014-08-01 DIAGNOSIS — K219 Gastro-esophageal reflux disease without esophagitis: Secondary | ICD-10-CM | POA: Diagnosis not present

## 2014-10-03 DIAGNOSIS — R251 Tremor, unspecified: Secondary | ICD-10-CM | POA: Diagnosis not present

## 2014-10-03 DIAGNOSIS — G20 Parkinson's disease: Secondary | ICD-10-CM | POA: Diagnosis not present

## 2014-10-03 DIAGNOSIS — Z6829 Body mass index (BMI) 29.0-29.9, adult: Secondary | ICD-10-CM | POA: Diagnosis not present

## 2014-10-03 DIAGNOSIS — I6330 Cerebral infarction due to thrombosis of unspecified cerebral artery: Secondary | ICD-10-CM | POA: Diagnosis not present

## 2014-10-26 DIAGNOSIS — M5417 Radiculopathy, lumbosacral region: Secondary | ICD-10-CM | POA: Diagnosis not present

## 2014-10-26 DIAGNOSIS — M47896 Other spondylosis, lumbar region: Secondary | ICD-10-CM | POA: Diagnosis not present

## 2014-10-26 DIAGNOSIS — G629 Polyneuropathy, unspecified: Secondary | ICD-10-CM | POA: Diagnosis not present

## 2014-11-27 DIAGNOSIS — M2041 Other hammer toe(s) (acquired), right foot: Secondary | ICD-10-CM | POA: Diagnosis not present

## 2014-11-27 DIAGNOSIS — M2170 Unequal limb length (acquired), unspecified site: Secondary | ICD-10-CM | POA: Diagnosis not present

## 2014-11-27 DIAGNOSIS — E119 Type 2 diabetes mellitus without complications: Secondary | ICD-10-CM | POA: Diagnosis not present

## 2014-11-27 DIAGNOSIS — M2042 Other hammer toe(s) (acquired), left foot: Secondary | ICD-10-CM | POA: Diagnosis not present

## 2014-12-04 DIAGNOSIS — M159 Polyosteoarthritis, unspecified: Secondary | ICD-10-CM | POA: Diagnosis not present

## 2014-12-04 DIAGNOSIS — G20 Parkinson's disease: Secondary | ICD-10-CM | POA: Diagnosis not present

## 2014-12-04 DIAGNOSIS — H1012 Acute atopic conjunctivitis, left eye: Secondary | ICD-10-CM | POA: Diagnosis not present

## 2014-12-04 DIAGNOSIS — M545 Low back pain: Secondary | ICD-10-CM | POA: Diagnosis not present

## 2014-12-04 DIAGNOSIS — I6789 Other cerebrovascular disease: Secondary | ICD-10-CM | POA: Diagnosis not present

## 2014-12-04 DIAGNOSIS — N400 Enlarged prostate without lower urinary tract symptoms: Secondary | ICD-10-CM | POA: Diagnosis not present

## 2014-12-04 DIAGNOSIS — K219 Gastro-esophageal reflux disease without esophagitis: Secondary | ICD-10-CM | POA: Diagnosis not present

## 2014-12-04 DIAGNOSIS — E119 Type 2 diabetes mellitus without complications: Secondary | ICD-10-CM | POA: Diagnosis not present

## 2014-12-04 DIAGNOSIS — R251 Tremor, unspecified: Secondary | ICD-10-CM | POA: Diagnosis not present

## 2014-12-04 DIAGNOSIS — E669 Obesity, unspecified: Secondary | ICD-10-CM | POA: Diagnosis not present

## 2014-12-04 DIAGNOSIS — E1140 Type 2 diabetes mellitus with diabetic neuropathy, unspecified: Secondary | ICD-10-CM | POA: Diagnosis not present

## 2014-12-09 DIAGNOSIS — R251 Tremor, unspecified: Secondary | ICD-10-CM | POA: Diagnosis not present

## 2014-12-09 DIAGNOSIS — E1140 Type 2 diabetes mellitus with diabetic neuropathy, unspecified: Secondary | ICD-10-CM | POA: Diagnosis not present

## 2014-12-09 DIAGNOSIS — I6789 Other cerebrovascular disease: Secondary | ICD-10-CM | POA: Diagnosis not present

## 2014-12-09 DIAGNOSIS — E119 Type 2 diabetes mellitus without complications: Secondary | ICD-10-CM | POA: Diagnosis not present

## 2014-12-09 DIAGNOSIS — K219 Gastro-esophageal reflux disease without esophagitis: Secondary | ICD-10-CM | POA: Diagnosis not present

## 2014-12-09 DIAGNOSIS — Z1389 Encounter for screening for other disorder: Secondary | ICD-10-CM | POA: Diagnosis not present

## 2014-12-09 DIAGNOSIS — N400 Enlarged prostate without lower urinary tract symptoms: Secondary | ICD-10-CM | POA: Diagnosis not present

## 2014-12-09 DIAGNOSIS — I2510 Atherosclerotic heart disease of native coronary artery without angina pectoris: Secondary | ICD-10-CM | POA: Diagnosis not present

## 2014-12-09 DIAGNOSIS — G20 Parkinson's disease: Secondary | ICD-10-CM | POA: Diagnosis not present

## 2014-12-09 DIAGNOSIS — I10 Essential (primary) hypertension: Secondary | ICD-10-CM | POA: Diagnosis not present

## 2014-12-09 DIAGNOSIS — M159 Polyosteoarthritis, unspecified: Secondary | ICD-10-CM | POA: Diagnosis not present

## 2014-12-09 DIAGNOSIS — E780 Pure hypercholesterolemia: Secondary | ICD-10-CM | POA: Diagnosis not present

## 2015-02-10 DIAGNOSIS — N400 Enlarged prostate without lower urinary tract symptoms: Secondary | ICD-10-CM | POA: Diagnosis not present

## 2015-02-10 DIAGNOSIS — E780 Pure hypercholesterolemia: Secondary | ICD-10-CM | POA: Diagnosis not present

## 2015-02-10 DIAGNOSIS — I6789 Other cerebrovascular disease: Secondary | ICD-10-CM | POA: Diagnosis not present

## 2015-02-10 DIAGNOSIS — I10 Essential (primary) hypertension: Secondary | ICD-10-CM | POA: Diagnosis not present

## 2015-02-10 DIAGNOSIS — K219 Gastro-esophageal reflux disease without esophagitis: Secondary | ICD-10-CM | POA: Diagnosis not present

## 2015-02-10 DIAGNOSIS — E119 Type 2 diabetes mellitus without complications: Secondary | ICD-10-CM | POA: Diagnosis not present

## 2015-02-10 DIAGNOSIS — M159 Polyosteoarthritis, unspecified: Secondary | ICD-10-CM | POA: Diagnosis not present

## 2015-02-10 DIAGNOSIS — M545 Low back pain: Secondary | ICD-10-CM | POA: Diagnosis not present

## 2015-02-10 DIAGNOSIS — G20 Parkinson's disease: Secondary | ICD-10-CM | POA: Diagnosis not present

## 2015-02-10 DIAGNOSIS — E1140 Type 2 diabetes mellitus with diabetic neuropathy, unspecified: Secondary | ICD-10-CM | POA: Diagnosis not present

## 2015-02-10 DIAGNOSIS — I2510 Atherosclerotic heart disease of native coronary artery without angina pectoris: Secondary | ICD-10-CM | POA: Diagnosis not present

## 2015-02-10 DIAGNOSIS — R251 Tremor, unspecified: Secondary | ICD-10-CM | POA: Diagnosis not present

## 2015-04-05 DIAGNOSIS — I6330 Cerebral infarction due to thrombosis of unspecified cerebral artery: Secondary | ICD-10-CM | POA: Diagnosis not present

## 2015-04-05 DIAGNOSIS — G20 Parkinson's disease: Secondary | ICD-10-CM | POA: Diagnosis not present

## 2015-04-05 DIAGNOSIS — R251 Tremor, unspecified: Secondary | ICD-10-CM | POA: Diagnosis not present

## 2015-04-05 DIAGNOSIS — N390 Urinary tract infection, site not specified: Secondary | ICD-10-CM

## 2015-04-05 DIAGNOSIS — Z6829 Body mass index (BMI) 29.0-29.9, adult: Secondary | ICD-10-CM | POA: Diagnosis not present

## 2015-04-05 HISTORY — DX: Urinary tract infection, site not specified: N39.0

## 2015-04-13 DIAGNOSIS — Z79899 Other long term (current) drug therapy: Secondary | ICD-10-CM | POA: Diagnosis not present

## 2015-04-13 DIAGNOSIS — R42 Dizziness and giddiness: Secondary | ICD-10-CM | POA: Diagnosis not present

## 2015-04-13 DIAGNOSIS — Z8673 Personal history of transient ischemic attack (TIA), and cerebral infarction without residual deficits: Secondary | ICD-10-CM | POA: Diagnosis not present

## 2015-04-13 DIAGNOSIS — N3001 Acute cystitis with hematuria: Secondary | ICD-10-CM | POA: Diagnosis not present

## 2015-04-13 DIAGNOSIS — R509 Fever, unspecified: Secondary | ICD-10-CM | POA: Diagnosis not present

## 2015-04-13 DIAGNOSIS — E119 Type 2 diabetes mellitus without complications: Secondary | ICD-10-CM | POA: Diagnosis not present

## 2015-04-13 DIAGNOSIS — Z7982 Long term (current) use of aspirin: Secondary | ICD-10-CM | POA: Diagnosis not present

## 2015-04-13 DIAGNOSIS — I10 Essential (primary) hypertension: Secondary | ICD-10-CM | POA: Diagnosis not present

## 2015-04-13 DIAGNOSIS — R531 Weakness: Secondary | ICD-10-CM | POA: Diagnosis not present

## 2015-04-13 DIAGNOSIS — R51 Headache: Secondary | ICD-10-CM | POA: Diagnosis not present

## 2015-04-13 DIAGNOSIS — N390 Urinary tract infection, site not specified: Secondary | ICD-10-CM | POA: Diagnosis not present

## 2015-04-21 DIAGNOSIS — R251 Tremor, unspecified: Secondary | ICD-10-CM | POA: Diagnosis not present

## 2015-04-21 DIAGNOSIS — E119 Type 2 diabetes mellitus without complications: Secondary | ICD-10-CM | POA: Diagnosis not present

## 2015-04-21 DIAGNOSIS — G20 Parkinson's disease: Secondary | ICD-10-CM | POA: Diagnosis not present

## 2015-04-21 DIAGNOSIS — Z1389 Encounter for screening for other disorder: Secondary | ICD-10-CM | POA: Diagnosis not present

## 2015-04-21 DIAGNOSIS — I2510 Atherosclerotic heart disease of native coronary artery without angina pectoris: Secondary | ICD-10-CM | POA: Diagnosis not present

## 2015-04-21 DIAGNOSIS — K219 Gastro-esophageal reflux disease without esophagitis: Secondary | ICD-10-CM | POA: Diagnosis not present

## 2015-04-21 DIAGNOSIS — M159 Polyosteoarthritis, unspecified: Secondary | ICD-10-CM | POA: Diagnosis not present

## 2015-04-21 DIAGNOSIS — N400 Enlarged prostate without lower urinary tract symptoms: Secondary | ICD-10-CM | POA: Diagnosis not present

## 2015-04-21 DIAGNOSIS — I6789 Other cerebrovascular disease: Secondary | ICD-10-CM | POA: Diagnosis not present

## 2015-04-21 DIAGNOSIS — E1140 Type 2 diabetes mellitus with diabetic neuropathy, unspecified: Secondary | ICD-10-CM | POA: Diagnosis not present

## 2015-04-21 DIAGNOSIS — N390 Urinary tract infection, site not specified: Secondary | ICD-10-CM | POA: Diagnosis not present

## 2015-04-21 DIAGNOSIS — M545 Low back pain: Secondary | ICD-10-CM | POA: Diagnosis not present

## 2015-04-30 DIAGNOSIS — E119 Type 2 diabetes mellitus without complications: Secondary | ICD-10-CM | POA: Diagnosis not present

## 2015-04-30 DIAGNOSIS — I6789 Other cerebrovascular disease: Secondary | ICD-10-CM | POA: Diagnosis not present

## 2015-04-30 DIAGNOSIS — M545 Low back pain: Secondary | ICD-10-CM | POA: Diagnosis not present

## 2015-04-30 DIAGNOSIS — R251 Tremor, unspecified: Secondary | ICD-10-CM | POA: Diagnosis not present

## 2015-04-30 DIAGNOSIS — E1140 Type 2 diabetes mellitus with diabetic neuropathy, unspecified: Secondary | ICD-10-CM | POA: Diagnosis not present

## 2015-04-30 DIAGNOSIS — I2510 Atherosclerotic heart disease of native coronary artery without angina pectoris: Secondary | ICD-10-CM | POA: Diagnosis not present

## 2015-04-30 DIAGNOSIS — E780 Pure hypercholesterolemia: Secondary | ICD-10-CM | POA: Diagnosis not present

## 2015-04-30 DIAGNOSIS — G20 Parkinson's disease: Secondary | ICD-10-CM | POA: Diagnosis not present

## 2015-04-30 DIAGNOSIS — M159 Polyosteoarthritis, unspecified: Secondary | ICD-10-CM | POA: Diagnosis not present

## 2015-04-30 DIAGNOSIS — K219 Gastro-esophageal reflux disease without esophagitis: Secondary | ICD-10-CM | POA: Diagnosis not present

## 2015-04-30 DIAGNOSIS — N400 Enlarged prostate without lower urinary tract symptoms: Secondary | ICD-10-CM | POA: Diagnosis not present

## 2015-04-30 DIAGNOSIS — N390 Urinary tract infection, site not specified: Secondary | ICD-10-CM | POA: Diagnosis not present

## 2015-04-30 DIAGNOSIS — I10 Essential (primary) hypertension: Secondary | ICD-10-CM | POA: Diagnosis not present

## 2015-05-01 DIAGNOSIS — Z1211 Encounter for screening for malignant neoplasm of colon: Secondary | ICD-10-CM | POA: Diagnosis not present

## 2015-05-01 DIAGNOSIS — Z86010 Personal history of colonic polyps: Secondary | ICD-10-CM | POA: Diagnosis not present

## 2015-09-03 DIAGNOSIS — M159 Polyosteoarthritis, unspecified: Secondary | ICD-10-CM | POA: Diagnosis not present

## 2015-09-03 DIAGNOSIS — K219 Gastro-esophageal reflux disease without esophagitis: Secondary | ICD-10-CM | POA: Diagnosis not present

## 2015-09-03 DIAGNOSIS — I6789 Other cerebrovascular disease: Secondary | ICD-10-CM | POA: Diagnosis not present

## 2015-09-03 DIAGNOSIS — I10 Essential (primary) hypertension: Secondary | ICD-10-CM | POA: Diagnosis not present

## 2015-09-03 DIAGNOSIS — Z9181 History of falling: Secondary | ICD-10-CM | POA: Diagnosis not present

## 2015-09-03 DIAGNOSIS — I2510 Atherosclerotic heart disease of native coronary artery without angina pectoris: Secondary | ICD-10-CM | POA: Diagnosis not present

## 2015-09-03 DIAGNOSIS — J209 Acute bronchitis, unspecified: Secondary | ICD-10-CM | POA: Diagnosis not present

## 2015-09-03 DIAGNOSIS — M545 Low back pain: Secondary | ICD-10-CM | POA: Diagnosis not present

## 2015-09-03 DIAGNOSIS — G20 Parkinson's disease: Secondary | ICD-10-CM | POA: Diagnosis not present

## 2015-09-03 DIAGNOSIS — E7800 Pure hypercholesterolemia, unspecified: Secondary | ICD-10-CM | POA: Diagnosis not present

## 2015-09-03 DIAGNOSIS — N400 Enlarged prostate without lower urinary tract symptoms: Secondary | ICD-10-CM | POA: Diagnosis not present

## 2015-09-03 DIAGNOSIS — E119 Type 2 diabetes mellitus without complications: Secondary | ICD-10-CM | POA: Diagnosis not present

## 2015-10-04 DIAGNOSIS — L578 Other skin changes due to chronic exposure to nonionizing radiation: Secondary | ICD-10-CM | POA: Diagnosis not present

## 2015-10-04 DIAGNOSIS — C4441 Basal cell carcinoma of skin of scalp and neck: Secondary | ICD-10-CM | POA: Diagnosis not present

## 2015-10-04 DIAGNOSIS — L821 Other seborrheic keratosis: Secondary | ICD-10-CM | POA: Diagnosis not present

## 2015-10-11 DIAGNOSIS — M4310 Spondylolisthesis, site unspecified: Secondary | ICD-10-CM | POA: Diagnosis not present

## 2015-10-16 DIAGNOSIS — M4310 Spondylolisthesis, site unspecified: Secondary | ICD-10-CM | POA: Diagnosis not present

## 2015-10-16 DIAGNOSIS — M545 Low back pain: Secondary | ICD-10-CM | POA: Diagnosis not present

## 2015-11-30 DIAGNOSIS — E7800 Pure hypercholesterolemia, unspecified: Secondary | ICD-10-CM | POA: Diagnosis not present

## 2015-11-30 DIAGNOSIS — E119 Type 2 diabetes mellitus without complications: Secondary | ICD-10-CM | POA: Diagnosis not present

## 2015-11-30 DIAGNOSIS — K219 Gastro-esophageal reflux disease without esophagitis: Secondary | ICD-10-CM | POA: Diagnosis not present

## 2015-11-30 DIAGNOSIS — I10 Essential (primary) hypertension: Secondary | ICD-10-CM | POA: Diagnosis not present

## 2015-11-30 DIAGNOSIS — G20 Parkinson's disease: Secondary | ICD-10-CM | POA: Diagnosis not present

## 2015-11-30 DIAGNOSIS — E1140 Type 2 diabetes mellitus with diabetic neuropathy, unspecified: Secondary | ICD-10-CM | POA: Diagnosis not present

## 2015-11-30 DIAGNOSIS — Z1159 Encounter for screening for other viral diseases: Secondary | ICD-10-CM | POA: Diagnosis not present

## 2015-11-30 DIAGNOSIS — M159 Polyosteoarthritis, unspecified: Secondary | ICD-10-CM | POA: Diagnosis not present

## 2015-11-30 DIAGNOSIS — I2510 Atherosclerotic heart disease of native coronary artery without angina pectoris: Secondary | ICD-10-CM | POA: Diagnosis not present

## 2015-11-30 DIAGNOSIS — R251 Tremor, unspecified: Secondary | ICD-10-CM | POA: Diagnosis not present

## 2015-11-30 DIAGNOSIS — N400 Enlarged prostate without lower urinary tract symptoms: Secondary | ICD-10-CM | POA: Diagnosis not present

## 2015-11-30 DIAGNOSIS — I6789 Other cerebrovascular disease: Secondary | ICD-10-CM | POA: Diagnosis not present

## 2015-12-20 ENCOUNTER — Other Ambulatory Visit: Payer: Self-pay | Admitting: Neurosurgery

## 2015-12-27 DIAGNOSIS — I2510 Atherosclerotic heart disease of native coronary artery without angina pectoris: Secondary | ICD-10-CM | POA: Diagnosis not present

## 2015-12-27 DIAGNOSIS — E119 Type 2 diabetes mellitus without complications: Secondary | ICD-10-CM | POA: Diagnosis not present

## 2015-12-27 DIAGNOSIS — Z79891 Long term (current) use of opiate analgesic: Secondary | ICD-10-CM | POA: Diagnosis not present

## 2015-12-27 DIAGNOSIS — K219 Gastro-esophageal reflux disease without esophagitis: Secondary | ICD-10-CM | POA: Diagnosis not present

## 2015-12-27 DIAGNOSIS — I10 Essential (primary) hypertension: Secondary | ICD-10-CM | POA: Diagnosis not present

## 2015-12-27 DIAGNOSIS — E669 Obesity, unspecified: Secondary | ICD-10-CM | POA: Diagnosis not present

## 2015-12-27 DIAGNOSIS — E7800 Pure hypercholesterolemia, unspecified: Secondary | ICD-10-CM | POA: Diagnosis not present

## 2015-12-27 DIAGNOSIS — G20 Parkinson's disease: Secondary | ICD-10-CM | POA: Diagnosis not present

## 2015-12-27 DIAGNOSIS — E1140 Type 2 diabetes mellitus with diabetic neuropathy, unspecified: Secondary | ICD-10-CM | POA: Diagnosis not present

## 2015-12-27 DIAGNOSIS — I6789 Other cerebrovascular disease: Secondary | ICD-10-CM | POA: Diagnosis not present

## 2015-12-27 DIAGNOSIS — N400 Enlarged prostate without lower urinary tract symptoms: Secondary | ICD-10-CM | POA: Diagnosis not present

## 2015-12-27 DIAGNOSIS — R251 Tremor, unspecified: Secondary | ICD-10-CM | POA: Diagnosis not present

## 2016-01-02 ENCOUNTER — Other Ambulatory Visit (HOSPITAL_COMMUNITY): Payer: Self-pay | Admitting: *Deleted

## 2016-01-02 ENCOUNTER — Encounter (HOSPITAL_COMMUNITY): Payer: Self-pay

## 2016-01-02 ENCOUNTER — Encounter (HOSPITAL_COMMUNITY)
Admission: RE | Admit: 2016-01-02 | Discharge: 2016-01-02 | Disposition: A | Discharge status: Home / Self Care | Payer: BLUE CROSS/BLUE SHIELD | Source: Ambulatory Visit | Attending: Neurosurgery | Admitting: Neurosurgery

## 2016-01-02 DIAGNOSIS — M4310 Spondylolisthesis, site unspecified: Secondary | ICD-10-CM | POA: Insufficient documentation

## 2016-01-02 DIAGNOSIS — Z01812 Encounter for preprocedural laboratory examination: Secondary | ICD-10-CM | POA: Insufficient documentation

## 2016-01-02 DIAGNOSIS — Z01818 Encounter for other preprocedural examination: Secondary | ICD-10-CM | POA: Insufficient documentation

## 2016-01-02 DIAGNOSIS — Z0183 Encounter for blood typing: Secondary | ICD-10-CM | POA: Insufficient documentation

## 2016-01-02 HISTORY — DX: Urinary tract infection, site not specified: N39.0

## 2016-01-02 HISTORY — DX: Coronary artery aneurysm: I25.41

## 2016-01-02 HISTORY — DX: Pneumonia, unspecified organism: J18.9

## 2016-01-02 HISTORY — DX: Personal history of urinary calculi: Z87.442

## 2016-01-02 LAB — BASIC METABOLIC PANEL
Anion gap: 8 (ref 5–15)
BUN: 14 mg/dL (ref 6–20)
CALCIUM: 9 mg/dL (ref 8.9–10.3)
CO2: 25 mmol/L (ref 22–32)
CREATININE: 0.72 mg/dL (ref 0.61–1.24)
Chloride: 107 mmol/L (ref 101–111)
Glucose, Bld: 100 mg/dL — ABNORMAL HIGH (ref 65–99)
Potassium: 3.7 mmol/L (ref 3.5–5.1)
SODIUM: 140 mmol/L (ref 135–145)

## 2016-01-02 LAB — CBC WITH DIFFERENTIAL/PLATELET
BASOS PCT: 1 %
Basophils Absolute: 0 10*3/uL (ref 0.0–0.1)
EOS ABS: 0.2 10*3/uL (ref 0.0–0.7)
Eosinophils Relative: 2 %
HEMATOCRIT: 42.1 % (ref 39.0–52.0)
HEMOGLOBIN: 13.9 g/dL (ref 13.0–17.0)
Lymphocytes Relative: 29 %
Lymphs Abs: 2.6 10*3/uL (ref 0.7–4.0)
MCH: 29.1 pg (ref 26.0–34.0)
MCHC: 33 g/dL (ref 30.0–36.0)
MCV: 88.1 fL (ref 78.0–100.0)
MONOS PCT: 10 %
Monocytes Absolute: 0.9 10*3/uL (ref 0.1–1.0)
NEUTROS ABS: 5.1 10*3/uL (ref 1.7–7.7)
NEUTROS PCT: 58 %
Platelets: 226 10*3/uL (ref 150–400)
RBC: 4.78 MIL/uL (ref 4.22–5.81)
RDW: 12.4 % (ref 11.5–15.5)
WBC: 8.7 10*3/uL (ref 4.0–10.5)

## 2016-01-02 LAB — TYPE AND SCREEN
ABO/RH(D): O POS
ANTIBODY SCREEN: NEGATIVE

## 2016-01-02 LAB — ABO/RH: ABO/RH(D): O POS

## 2016-01-02 LAB — SURGICAL PCR SCREEN
MRSA, PCR: NEGATIVE
Staphylococcus aureus: NEGATIVE

## 2016-01-02 LAB — GLUCOSE, CAPILLARY: GLUCOSE-CAPILLARY: 102 mg/dL — AB (ref 65–99)

## 2016-01-02 NOTE — Pre-Procedure Instructions (Addendum)
Luay Conrow Maryruth Eve.  01/02/2016    Your procedure is scheduled on Tuesday, January 08, 2016   Report to [facility] Entrance "A" Admitting Office at 6:00 AM.  Call this number if you have problems the morning of surgery: [phone]  Any questions prior to day of surgery, please call [phone] between 8 & 4 PM.   Remember:  Do not eat food or drink liquids after midnight Monday, 01/07/16.  Take these medicines the morning of surgery with A SIP OF WATER: Amlodipine (Norvasc), Carbidopa-Levodopa (Sinemet IR), Omeprazole (Prilosec), Tamsulosin (Flomax), may use eye drops, Hydrocodone - if needed  Stop Aspirin, NSAIDS (Ibuprofen, Aleve, etc.), Multivitamins, Fish Oil and Herbal Medications as of today.glucosamine-chondroitin  No metformin day of surgery    How to Manage Your Diabetes Before Surgery   Why is it important to control my blood sugar before and after surgery?   Improving blood sugar levels before and after surgery helps healing and can limit problems.  A way of improving blood sugar control is eating a healthy diet by:  - Eating less sugar and carbohydrates  - Increasing activity/exercise  - Talk with your doctor about reaching your blood sugar goals  High blood sugars (greater than 180 mg/dL) can raise your risk of infections and slow down your recovery so you will need to focus on controlling your diabetes during the weeks before surgery.  Make sure that the doctor who takes care of your diabetes knows about your planned surgery including the date and location.  How do I manage my blood sugars before surgery?   Check your blood sugar at least 4 times a day, 2 days before surgery to make sure that they are not too high or low.  Check your blood sugar the morning of your surgery when you wake up and every 2 hours until you get to the Short-Stay unit.  Treat a low blood sugar (less than 70 mg/dL) with 1/2 cup of clear juice (cranberry or apple), 4 glucose  tablets, OR glucose gel.  Recheck blood sugar in 15 minutes after treatment (to make sure it is greater than 70 mg/dL).  If blood sugar is not greater than 70 mg/dL on re-check, call [phone] for further instructions.   Report your blood sugar to the Short-Stay nurse when you get to Short-Stay.  References:  [facility], 2007 "How to Manage your Diabetes Before and After Surgery".  What do I do about my diabetes medications?   Do not take oral diabetes medicines (pills) the morning of surgery(.No metformin)   Do not wear jewelry.  Do not wear lotions, powders, or cologne.  You may wear deodorant.  Men may shave face and neck.  Do not bring valuables to the hospital.  [facility] is not responsible for any belongings or valuables.  Contacts, dentures or bridgework may not be worn into surgery.  Leave your suitcase in the car.  After surgery it may be brought to your room.  For patients admitted to the hospital, discharge time will be determined by your treatment team.  Special instructions:  [facility] - Preparing for Surgery  Before surgery, you can play an important role.  Because skin is not sterile, your skin needs to be as free of germs as possible.  You can reduce the number of germs on you skin by washing with CHG (chlorahexidine gluconate) soap before surgery.  CHG is an antiseptic cleaner which kills germs and bonds with  the skin to continue killing germs even after washing.  Please DO NOT use if you have an allergy to CHG or antibacterial soaps.  If your skin becomes reddened/irritated stop using the CHG and inform your nurse when you arrive at Short Stay.  Do not shave (including legs and underarms) for at least 48 hours prior to the first CHG shower.  You may shave your face.  Please follow these instructions carefully:   1.  Shower with CHG Soap the night before surgery and the                                morning of Surgery.  2.  If  you choose to wash your hair, wash your hair first as usual with your       normal shampoo.  3.  After you shampoo, rinse your hair and body thoroughly to remove the                      Shampoo.  4.  Use CHG as you would any other liquid soap.  You can apply chg directly       to the skin and wash gently with scrungie or a clean washcloth.  5.  Apply the CHG Soap to your body ONLY FROM THE NECK DOWN.        Do not use on open wounds or open sores.  Avoid contact with your eyes, ears, mouth and genitals (private parts).  Wash genitals (private parts) with your normal soap.  6.  Wash thoroughly, paying special attention to the area where your surgery        will be performed.  7.  Thoroughly rinse your body with warm water from the neck down.  8.  DO NOT shower/wash with your normal soap after using and rinsing off       the CHG Soap.  9.  Pat yourself dry with a clean towel.            10.  Wear clean pajamas.            11.  Place clean sheets on your bed the night of your first shower and do not        sleep with pets.  Day of Surgery  Do not apply any lotions the morning of surgery.  Please wear clean clothes to the hospital.   Please read over the following fact sheets that you were given. Pain Booklet, Coughing and Deep Breathing, MRSA Information and Surgical Site Infection Prevention

## 2016-01-03 LAB — HEMOGLOBIN A1C
HEMOGLOBIN A1C: 6.6 % — AB (ref 4.8–5.6)
MEAN PLASMA GLUCOSE: 143 mg/dL

## 2016-01-07 MED ORDER — CEFAZOLIN SODIUM-DEXTROSE 2-4 GM/100ML-% IV SOLN
2.0000 g | INTRAVENOUS | Status: AC
  Administered 2016-01-08: 2 g via INTRAVENOUS
  Filled 2016-01-07: qty 100

## 2016-01-07 MED ORDER — DEXAMETHASONE SODIUM PHOSPHATE 10 MG/ML IJ SOLN
10.0000 mg | INTRAMUSCULAR | Status: AC
  Administered 2016-01-08: 10 mg via INTRAVENOUS
  Filled 2016-01-07: qty 1

## 2016-01-07 NOTE — Anesthesia Preprocedure Evaluation (Addendum)
Anesthesia Evaluation  Patient identified by MRN, date of birth, ID band Patient awake    Reviewed: Allergy & Precautions, NPO status , Patient's Chart, lab work & pertinent test results  History of Anesthesia Complications Negative for: history of anesthetic complications  Airway Mallampati: II  TM Distance: >3 FB Neck ROM: Full    Dental  (+) Edentulous Upper, Edentulous Lower   Pulmonary neg pulmonary ROS, COPD, former smoker (quit 1998),    breath sounds clear to auscultation       Cardiovascular hypertension, Pt. on medications (-) angina Rhythm:Regular Rate:Normal     Neuro/Psych Tremor CVA, No Residual Symptoms    GI/Hepatic Neg liver ROS, GERD  Medicated and Controlled,  Endo/Other  diabetes (glu 138), Oral Hypoglycemic AgentsMorbid obesity  Renal/GU negative Renal ROS     Musculoskeletal  (+) Arthritis , Osteoarthritis,    Abdominal (+) + obese,   Peds  Hematology   Anesthesia Other Findings   Reproductive/Obstetrics                          Anesthesia Physical Anesthesia Plan  ASA: III  Anesthesia Plan: General   Post-op Pain Management:    Induction: Intravenous  Airway Management Planned: Oral ETT  Additional Equipment:   Intra-op Plan:   Post-operative Plan: Extubation in OR  Informed Consent: I have reviewed the patients History and Physical, chart, labs and discussed the procedure including the risks, benefits and alternatives for the proposed anesthesia with the patient or authorized representative who has indicated his/her understanding and acceptance.     Plan Discussed with: CRNA and Surgeon  Anesthesia Plan Comments: (Plan routine monitors, GETA)        Anesthesia Quick Evaluation

## 2016-01-08 ENCOUNTER — Inpatient Hospital Stay (HOSPITAL_COMMUNITY): Payer: BLUE CROSS/BLUE SHIELD

## 2016-01-08 ENCOUNTER — Encounter (HOSPITAL_COMMUNITY)
Admission: RE | Disposition: A | Discharge status: Home / Self Care | Payer: Self-pay | Source: Ambulatory Visit | Attending: Neurosurgery

## 2016-01-08 ENCOUNTER — Inpatient Hospital Stay (HOSPITAL_COMMUNITY)
Admission: RE | Admit: 2016-01-08 | Discharge: 2016-01-09 | DRG: 460 | Disposition: A | Discharge status: Home / Self Care | Payer: BLUE CROSS/BLUE SHIELD | Source: Ambulatory Visit | Attending: Neurosurgery | Admitting: Neurosurgery

## 2016-01-08 ENCOUNTER — Encounter (HOSPITAL_COMMUNITY): Payer: Self-pay | Admitting: Anesthesiology

## 2016-01-08 ENCOUNTER — Inpatient Hospital Stay (HOSPITAL_COMMUNITY): Payer: BLUE CROSS/BLUE SHIELD | Admitting: Anesthesiology

## 2016-01-08 DIAGNOSIS — M4310 Spondylolisthesis, site unspecified: Secondary | ICD-10-CM

## 2016-01-08 DIAGNOSIS — Z8673 Personal history of transient ischemic attack (TIA), and cerebral infarction without residual deficits: Secondary | ICD-10-CM

## 2016-01-08 DIAGNOSIS — M549 Dorsalgia, unspecified: Secondary | ICD-10-CM | POA: Diagnosis not present

## 2016-01-08 DIAGNOSIS — K219 Gastro-esophageal reflux disease without esophagitis: Secondary | ICD-10-CM | POA: Diagnosis present

## 2016-01-08 DIAGNOSIS — J449 Chronic obstructive pulmonary disease, unspecified: Secondary | ICD-10-CM | POA: Diagnosis present

## 2016-01-08 DIAGNOSIS — E119 Type 2 diabetes mellitus without complications: Secondary | ICD-10-CM | POA: Diagnosis present

## 2016-01-08 DIAGNOSIS — M4316 Spondylolisthesis, lumbar region: Principal | ICD-10-CM | POA: Diagnosis present

## 2016-01-08 DIAGNOSIS — M4806 Spinal stenosis, lumbar region: Secondary | ICD-10-CM | POA: Diagnosis present

## 2016-01-08 DIAGNOSIS — Z87891 Personal history of nicotine dependence: Secondary | ICD-10-CM | POA: Diagnosis not present

## 2016-01-08 DIAGNOSIS — Z7984 Long term (current) use of oral hypoglycemic drugs: Secondary | ICD-10-CM

## 2016-01-08 DIAGNOSIS — M1990 Unspecified osteoarthritis, unspecified site: Secondary | ICD-10-CM | POA: Diagnosis present

## 2016-01-08 DIAGNOSIS — M545 Low back pain: Secondary | ICD-10-CM | POA: Diagnosis not present

## 2016-01-08 DIAGNOSIS — Z6831 Body mass index (BMI) 31.0-31.9, adult: Secondary | ICD-10-CM | POA: Diagnosis not present

## 2016-01-08 DIAGNOSIS — I10 Essential (primary) hypertension: Secondary | ICD-10-CM | POA: Diagnosis present

## 2016-01-08 DIAGNOSIS — Z79899 Other long term (current) drug therapy: Secondary | ICD-10-CM | POA: Diagnosis not present

## 2016-01-08 DIAGNOSIS — Z419 Encounter for procedure for purposes other than remedying health state, unspecified: Secondary | ICD-10-CM

## 2016-01-08 DIAGNOSIS — Z85828 Personal history of other malignant neoplasm of skin: Secondary | ICD-10-CM | POA: Diagnosis not present

## 2016-01-08 DIAGNOSIS — Z7982 Long term (current) use of aspirin: Secondary | ICD-10-CM

## 2016-01-08 HISTORY — DX: Spondylolisthesis, site unspecified: M43.10

## 2016-01-08 LAB — GLUCOSE, CAPILLARY
GLUCOSE-CAPILLARY: 138 mg/dL — AB (ref 65–99)
GLUCOSE-CAPILLARY: 154 mg/dL — AB (ref 65–99)
GLUCOSE-CAPILLARY: 188 mg/dL — AB (ref 65–99)
Glucose-Capillary: 182 mg/dL — ABNORMAL HIGH (ref 65–99)
Glucose-Capillary: 208 mg/dL — ABNORMAL HIGH (ref 65–99)

## 2016-01-08 SURGERY — POSTERIOR LUMBAR FUSION 1 LEVEL
Anesthesia: General | Site: Back

## 2016-01-08 MED ORDER — PROMETHAZINE HCL 25 MG/ML IJ SOLN: 6.2500 mg | INTRAMUSCULAR | Status: DC | PRN

## 2016-01-08 MED ORDER — MEPERIDINE HCL 25 MG/ML IJ SOLN: 6.2500 mg | INTRAMUSCULAR | Status: DC | PRN

## 2016-01-08 MED ORDER — HYDROMORPHONE HCL 1 MG/ML IJ SOLN
INTRAMUSCULAR | Status: AC
  Filled 2016-01-08: qty 1

## 2016-01-08 MED ORDER — SODIUM CHLORIDE 0.9 % IR SOLN
Status: DC | PRN
  Administered 2016-01-08: 09:00:00

## 2016-01-08 MED ORDER — FENTANYL CITRATE (PF) 250 MCG/5ML IJ SOLN
INTRAMUSCULAR | Status: AC
  Filled 2016-01-08: qty 10

## 2016-01-08 MED ORDER — CALCIUM POLYCARBOPHIL 625 MG PO TABS
625.0000 mg | ORAL_TABLET | ORAL | Status: DC
  Administered 2016-01-08: 625 mg via ORAL
  Filled 2016-01-08: qty 1

## 2016-01-08 MED ORDER — ROSUVASTATIN CALCIUM 20 MG PO TABS
10.0000 mg | ORAL_TABLET | Freq: Every day | ORAL | Status: DC
  Administered 2016-01-08: 10 mg via ORAL
  Filled 2016-01-08: qty 1

## 2016-01-08 MED ORDER — PROPOFOL 10 MG/ML IV BOLUS
INTRAVENOUS | Status: AC
  Filled 2016-01-08: qty 20

## 2016-01-08 MED ORDER — METFORMIN HCL 850 MG PO TABS
850.0000 mg | ORAL_TABLET | Freq: Three times a day (TID) | ORAL | Status: DC
  Administered 2016-01-08 – 2016-01-09 (×2): 850 mg via ORAL
  Filled 2016-01-08 (×3): qty 1

## 2016-01-08 MED ORDER — ACETAMINOPHEN 325 MG PO TABS: 650.0000 mg | ORAL_TABLET | ORAL | Status: DC | PRN

## 2016-01-08 MED ORDER — DEXTROSE 5 % IV SOLN
INTRAVENOUS | Status: DC | PRN
  Administered 2016-01-08: 08:00:00 via INTRAVENOUS

## 2016-01-08 MED ORDER — MIDAZOLAM HCL 2 MG/2ML IJ SOLN
INTRAMUSCULAR | Status: AC
  Filled 2016-01-08: qty 2

## 2016-01-08 MED ORDER — VANCOMYCIN HCL 1000 MG IV SOLR
INTRAVENOUS | Status: DC | PRN
  Administered 2016-01-08: 1000 mg via TOPICAL

## 2016-01-08 MED ORDER — HYDROCODONE-ACETAMINOPHEN 7.5-325 MG PO TABS: 1.0000 | ORAL_TABLET | Freq: Two times a day (BID) | ORAL | Status: DC | PRN

## 2016-01-08 MED ORDER — ROCURONIUM BROMIDE 100 MG/10ML IV SOLN
INTRAVENOUS | Status: DC | PRN
  Administered 2016-01-08: 50 mg via INTRAVENOUS

## 2016-01-08 MED ORDER — LACTATED RINGERS IV SOLN
INTRAVENOUS | Status: DC | PRN
  Administered 2016-01-08 (×2): via INTRAVENOUS

## 2016-01-08 MED ORDER — HYDROCODONE-ACETAMINOPHEN 5-325 MG PO TABS: 1.0000 | ORAL_TABLET | ORAL | Status: DC | PRN

## 2016-01-08 MED ORDER — VECURONIUM BROMIDE 10 MG IV SOLR
INTRAVENOUS | Status: DC | PRN
  Administered 2016-01-08: 3 mg via INTRAVENOUS

## 2016-01-08 MED ORDER — AMLODIPINE BESYLATE 10 MG PO TABS
10.0000 mg | ORAL_TABLET | Freq: Every day | ORAL | Status: DC
  Administered 2016-01-09: 10 mg via ORAL
  Filled 2016-01-08: qty 1

## 2016-01-08 MED ORDER — BUPIVACAINE HCL (PF) 0.25 % IJ SOLN
INTRAMUSCULAR | Status: DC | PRN
  Administered 2016-01-08: 20 mL

## 2016-01-08 MED ORDER — SODIUM CHLORIDE 0.9 % IV SOLN: 250.0000 mL | INTRAVENOUS | Status: DC

## 2016-01-08 MED ORDER — ROCURONIUM BROMIDE 50 MG/5ML IV SOLN
INTRAVENOUS | Status: AC
  Filled 2016-01-08: qty 1

## 2016-01-08 MED ORDER — OXYCODONE-ACETAMINOPHEN 5-325 MG PO TABS
1.0000 | ORAL_TABLET | ORAL | Status: DC | PRN
  Administered 2016-01-08 – 2016-01-09 (×5): 2 via ORAL
  Filled 2016-01-08 (×7): qty 2

## 2016-01-08 MED ORDER — ARTIFICIAL TEARS OP OINT
TOPICAL_OINTMENT | OPHTHALMIC | Status: AC
  Filled 2016-01-08: qty 3.5

## 2016-01-08 MED ORDER — INSULIN ASPART 100 UNIT/ML ~~LOC~~ SOLN
0.0000 [IU] | Freq: Three times a day (TID) | SUBCUTANEOUS | Status: DC
  Administered 2016-01-08: 3 [IU] via SUBCUTANEOUS
  Administered 2016-01-09: 2 [IU] via SUBCUTANEOUS

## 2016-01-08 MED ORDER — ARTIFICIAL TEARS OP OINT
TOPICAL_OINTMENT | OPHTHALMIC | Status: DC | PRN
  Administered 2016-01-08: 1 via OPHTHALMIC

## 2016-01-08 MED ORDER — ADULT MULTIVITAMIN W/MINERALS CH
1.0000 | ORAL_TABLET | Freq: Every day | ORAL | Status: DC
  Administered 2016-01-08 – 2016-01-09 (×2): 1 via ORAL
  Filled 2016-01-08 (×3): qty 1

## 2016-01-08 MED ORDER — ACETAMINOPHEN 650 MG RE SUPP: 650.0000 mg | RECTAL | Status: DC | PRN

## 2016-01-08 MED ORDER — EPHEDRINE SULFATE 50 MG/ML IJ SOLN
INTRAMUSCULAR | Status: DC | PRN
  Administered 2016-01-08 (×2): 10 mg via INTRAVENOUS

## 2016-01-08 MED ORDER — VANCOMYCIN HCL 1000 MG IV SOLR
INTRAVENOUS | Status: AC
  Filled 2016-01-08: qty 1000

## 2016-01-08 MED ORDER — OLOPATADINE HCL 0.1 % OP SOLN
1.0000 [drp] | Freq: Every evening | OPHTHALMIC | Status: DC | PRN
  Filled 2016-01-08: qty 5

## 2016-01-08 MED ORDER — SUGAMMADEX SODIUM 200 MG/2ML IV SOLN
INTRAVENOUS | Status: DC | PRN
  Administered 2016-01-08: 200 mg via INTRAVENOUS

## 2016-01-08 MED ORDER — 0.9 % SODIUM CHLORIDE (POUR BTL) OPTIME
TOPICAL | Status: DC | PRN
  Administered 2016-01-08: 1000 mL

## 2016-01-08 MED ORDER — DIAZEPAM 5 MG PO TABS
5.0000 mg | ORAL_TABLET | Freq: Four times a day (QID) | ORAL | Status: DC | PRN
  Administered 2016-01-08 (×3): 5 mg via ORAL
  Filled 2016-01-08: qty 1
  Filled 2016-01-08 (×2): qty 2
  Filled 2016-01-08 (×2): qty 1

## 2016-01-08 MED ORDER — DIAZEPAM 5 MG PO TABS
ORAL_TABLET | ORAL | Status: AC
  Filled 2016-01-08: qty 1

## 2016-01-08 MED ORDER — ONDANSETRON HCL 4 MG/2ML IJ SOLN
INTRAMUSCULAR | Status: DC | PRN
  Administered 2016-01-08: 4 mg via INTRAVENOUS

## 2016-01-08 MED ORDER — SODIUM CHLORIDE 0.9 % IV SOLN
10.0000 mg | INTRAVENOUS | Status: DC | PRN
  Administered 2016-01-08: 10 ug/min via INTRAVENOUS

## 2016-01-08 MED ORDER — ASPIRIN EC 81 MG PO TBEC
81.0000 mg | DELAYED_RELEASE_TABLET | Freq: Every day | ORAL | Status: DC
  Administered 2016-01-08 – 2016-01-09 (×2): 81 mg via ORAL
  Filled 2016-01-08 (×2): qty 1

## 2016-01-08 MED ORDER — SODIUM CHLORIDE 0.9% FLUSH: 3.0000 mL | INTRAVENOUS | Status: DC | PRN

## 2016-01-08 MED ORDER — OXYCODONE-ACETAMINOPHEN 5-325 MG PO TABS
ORAL_TABLET | ORAL | Status: AC
  Filled 2016-01-08: qty 2

## 2016-01-08 MED ORDER — MIDAZOLAM HCL 2 MG/2ML IJ SOLN: 0.5000 mg | Freq: Once | INTRAMUSCULAR | Status: DC | PRN

## 2016-01-08 MED ORDER — NAPHAZOLINE-GLYCERIN 0.012-0.2 % OP SOLN
1.0000 [drp] | Freq: Every day | OPHTHALMIC | Status: DC | PRN
  Filled 2016-01-08: qty 15

## 2016-01-08 MED ORDER — PANTOPRAZOLE SODIUM 40 MG PO TBEC
40.0000 mg | DELAYED_RELEASE_TABLET | Freq: Every day | ORAL | Status: DC
  Administered 2016-01-08 – 2016-01-09 (×2): 40 mg via ORAL
  Filled 2016-01-08 (×2): qty 1

## 2016-01-08 MED ORDER — IRBESARTAN 300 MG PO TABS
300.0000 mg | ORAL_TABLET | Freq: Every day | ORAL | Status: DC
  Administered 2016-01-08 – 2016-01-09 (×2): 300 mg via ORAL
  Filled 2016-01-08 (×2): qty 1

## 2016-01-08 MED ORDER — ONDANSETRON HCL 4 MG/2ML IJ SOLN
INTRAMUSCULAR | Status: AC
  Filled 2016-01-08: qty 2

## 2016-01-08 MED ORDER — MENTHOL 3 MG MT LOZG: 1.0000 | LOZENGE | OROMUCOSAL | Status: DC | PRN

## 2016-01-08 MED ORDER — HYDROMORPHONE HCL 1 MG/ML IJ SOLN
0.2500 mg | INTRAMUSCULAR | Status: DC | PRN
  Administered 2016-01-08 (×4): 0.5 mg via INTRAVENOUS

## 2016-01-08 MED ORDER — SODIUM CHLORIDE 0.9% FLUSH
3.0000 mL | Freq: Two times a day (BID) | INTRAVENOUS | Status: DC
  Administered 2016-01-08: 3 mL via INTRAVENOUS

## 2016-01-08 MED ORDER — HYDROMORPHONE HCL 1 MG/ML IJ SOLN
0.5000 mg | INTRAMUSCULAR | Status: DC | PRN
  Filled 2016-01-08: qty 1

## 2016-01-08 MED ORDER — PROPOFOL 10 MG/ML IV BOLUS
INTRAVENOUS | Status: DC | PRN
  Administered 2016-01-08: 100 mg via INTRAVENOUS

## 2016-01-08 MED ORDER — CEFAZOLIN SODIUM 1-5 GM-% IV SOLN
1.0000 g | Freq: Three times a day (TID) | INTRAVENOUS | Status: AC
  Administered 2016-01-08 (×2): 1 g via INTRAVENOUS
  Filled 2016-01-08 (×2): qty 50

## 2016-01-08 MED ORDER — LIDOCAINE HCL (CARDIAC) 20 MG/ML IV SOLN
INTRAVENOUS | Status: DC | PRN
  Administered 2016-01-08: 20 mg via INTRAVENOUS

## 2016-01-08 MED ORDER — CARBIDOPA-LEVODOPA 25-100 MG PO TABS
1.0000 | ORAL_TABLET | Freq: Three times a day (TID) | ORAL | Status: DC
Start: 2016-01-08 — End: 2016-01-09
  Administered 2016-01-08 – 2016-01-09 (×3): 1 via ORAL
  Filled 2016-01-08 (×4): qty 1

## 2016-01-08 MED ORDER — GABAPENTIN 100 MG PO CAPS: 100.0000 mg | ORAL_CAPSULE | Freq: Every evening | ORAL | Status: DC | PRN

## 2016-01-08 MED ORDER — SUGAMMADEX SODIUM 200 MG/2ML IV SOLN
INTRAVENOUS | Status: AC
  Filled 2016-01-08: qty 2

## 2016-01-08 MED ORDER — MIDAZOLAM HCL 5 MG/5ML IJ SOLN
INTRAMUSCULAR | Status: DC | PRN
  Administered 2016-01-08: 2 mg via INTRAVENOUS

## 2016-01-08 MED ORDER — GLUCOSAMINE-CHONDROITIN 500-400 MG PO TABS: 1.0000 | ORAL_TABLET | Freq: Every day | ORAL | Status: DC

## 2016-01-08 MED ORDER — ONDANSETRON HCL 4 MG/2ML IJ SOLN: 4.0000 mg | INTRAMUSCULAR | Status: DC | PRN

## 2016-01-08 MED ORDER — TAMSULOSIN HCL 0.4 MG PO CAPS
0.4000 mg | ORAL_CAPSULE | Freq: Every day | ORAL | Status: DC
  Administered 2016-01-09: 0.4 mg via ORAL
  Filled 2016-01-08: qty 1

## 2016-01-08 MED ORDER — PHENOL 1.4 % MT LIQD: 1.0000 | OROMUCOSAL | Status: DC | PRN

## 2016-01-08 MED ORDER — FENTANYL CITRATE (PF) 100 MCG/2ML IJ SOLN
INTRAMUSCULAR | Status: DC | PRN
  Administered 2016-01-08: 50 ug via INTRAVENOUS
  Administered 2016-01-08 (×2): 100 ug via INTRAVENOUS
  Administered 2016-01-08: 250 ug via INTRAVENOUS

## 2016-01-08 MED ORDER — THROMBIN 20000 UNITS EX SOLR
CUTANEOUS | Status: DC | PRN
  Administered 2016-01-08: 09:00:00 via TOPICAL

## 2016-01-08 MED FILL — Heparin Sodium (Porcine) Inj 1000 Unit/ML: INTRAMUSCULAR | Qty: 30 | Status: AC

## 2016-01-08 MED FILL — Sodium Chloride IV Soln 0.9%: INTRAVENOUS | Qty: 2000 | Status: AC

## 2016-01-08 SURGICAL SUPPLY — 61 items
BAG DECANTER FOR FLEXI CONT (MISCELLANEOUS) ×3 IMPLANT
BENZOIN TINCTURE PRP APPL 2/3 (GAUZE/BANDAGES/DRESSINGS) ×3 IMPLANT
BLADE CLIPPER SURG (BLADE) IMPLANT
BRUSH SCRUB EZ PLAIN DRY (MISCELLANEOUS) ×3 IMPLANT
BUR CUTTER 7.0 ROUND (BURR) ×3 IMPLANT
BUR MATCHSTICK NEURO 3.0 LAGG (BURR) ×3 IMPLANT
CANISTER SUCT 3000ML PPV (MISCELLANEOUS) ×3 IMPLANT
CAP LCK SPNE (Orthopedic Implant) ×4 IMPLANT
CAP LOCK SPINE RADIUS (Orthopedic Implant) ×4 IMPLANT
CAP LOCKING (Orthopedic Implant) ×8 IMPLANT
CLOSURE WOUND 1/2 X4 (GAUZE/BANDAGES/DRESSINGS) ×2
CONT SPEC 4OZ CLIKSEAL STRL BL (MISCELLANEOUS) ×3 IMPLANT
COVER BACK TABLE 60X90IN (DRAPES) ×3 IMPLANT
DECANTER SPIKE VIAL GLASS SM (MISCELLANEOUS) ×3 IMPLANT
DEVICE INTERBODY ELEVATE 23X10 (Cage) ×6 IMPLANT
DRAPE C-ARM 42X72 X-RAY (DRAPES) ×6 IMPLANT
DRAPE LAPAROTOMY 100X72X124 (DRAPES) ×3 IMPLANT
DRAPE POUCH INSTRU U-SHP 10X18 (DRAPES) ×3 IMPLANT
DRAPE PROXIMA HALF (DRAPES) ×3 IMPLANT
DRAPE SURG 17X23 STRL (DRAPES) ×12 IMPLANT
DRSG OPSITE POSTOP 4X6 (GAUZE/BANDAGES/DRESSINGS) ×3 IMPLANT
DURAPREP 26ML APPLICATOR (WOUND CARE) ×3 IMPLANT
ELECT REM PT RETURN 9FT ADLT (ELECTROSURGICAL) ×3
ELECTRODE REM PT RTRN 9FT ADLT (ELECTROSURGICAL) ×1 IMPLANT
EVACUATOR 1/8 PVC DRAIN (DRAIN) IMPLANT
GAUZE SPONGE 4X4 12PLY STRL (GAUZE/BANDAGES/DRESSINGS) IMPLANT
GAUZE SPONGE 4X4 16PLY XRAY LF (GAUZE/BANDAGES/DRESSINGS) IMPLANT
GLOVE BIOGEL PI IND STRL 7.0 (GLOVE) ×4 IMPLANT
GLOVE BIOGEL PI IND STRL 7.5 (GLOVE) ×3 IMPLANT
GLOVE BIOGEL PI INDICATOR 7.0 (GLOVE) ×8
GLOVE BIOGEL PI INDICATOR 7.5 (GLOVE) ×6
GLOVE ECLIPSE 7.0 STRL STRAW (GLOVE) ×3 IMPLANT
GLOVE ECLIPSE 9.0 STRL (GLOVE) ×6 IMPLANT
GLOVE EXAM NITRILE LRG STRL (GLOVE) IMPLANT
GLOVE EXAM NITRILE MD LF STRL (GLOVE) IMPLANT
GLOVE EXAM NITRILE XL STR (GLOVE) IMPLANT
GLOVE EXAM NITRILE XS STR PU (GLOVE) IMPLANT
GOWN STRL REUS W/ TWL LRG LVL3 (GOWN DISPOSABLE) ×2 IMPLANT
GOWN STRL REUS W/ TWL XL LVL3 (GOWN DISPOSABLE) ×2 IMPLANT
GOWN STRL REUS W/TWL 2XL LVL3 (GOWN DISPOSABLE) IMPLANT
GOWN STRL REUS W/TWL LRG LVL3 (GOWN DISPOSABLE) ×4
GOWN STRL REUS W/TWL XL LVL3 (GOWN DISPOSABLE) ×4
KIT BASIN OR (CUSTOM PROCEDURE TRAY) ×3 IMPLANT
KIT ROOM TURNOVER OR (KITS) ×3 IMPLANT
LIQUID BAND (GAUZE/BANDAGES/DRESSINGS) ×3 IMPLANT
NEEDLE HYPO 22GX1.5 SAFETY (NEEDLE) ×3 IMPLANT
NS IRRIG 1000ML POUR BTL (IV SOLUTION) ×3 IMPLANT
PACK LAMINECTOMY NEURO (CUSTOM PROCEDURE TRAY) ×3 IMPLANT
ROD RADIUS 35MM (Rod) ×6 IMPLANT
SCREW 6.75X40MM (Screw) ×6 IMPLANT
SCREW 6.75X45MM (Screw) ×6 IMPLANT
SPONGE SURGIFOAM ABS GEL 100 (HEMOSTASIS) ×3 IMPLANT
STRIP CLOSURE SKIN 1/2X4 (GAUZE/BANDAGES/DRESSINGS) ×4 IMPLANT
SUT VIC AB 0 CT1 18XCR BRD8 (SUTURE) ×1 IMPLANT
SUT VIC AB 0 CT1 8-18 (SUTURE) ×2
SUT VIC AB 2-0 CT1 18 (SUTURE) ×3 IMPLANT
SUT VIC AB 3-0 SH 8-18 (SUTURE) ×3 IMPLANT
TOWEL OR 17X24 6PK STRL BLUE (TOWEL DISPOSABLE) ×3 IMPLANT
TOWEL OR 17X26 10 PK STRL BLUE (TOWEL DISPOSABLE) ×3 IMPLANT
TRAY FOLEY W/METER SILVER 16FR (SET/KITS/TRAYS/PACK) ×3 IMPLANT
WATER STERILE IRR 1000ML POUR (IV SOLUTION) ×3 IMPLANT
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
## 2016-01-08 NOTE — Transfer of Care (Signed)
Immediate Anesthesia Transfer of Care Note  Patient: Micheal [name].  Procedure(s) Performed: Procedure(s): Posterior Lumbar Interbody Fusion Lumbar four-five (N/A)  Patient Location: PACU  Anesthesia Type:General  Level of Consciousness: awake, oriented, sedated, patient cooperative and responds to stimulation  Airway & Oxygen Therapy: Patient Spontanous Breathing and Patient connected to nasal cannula oxygen  Post-op Assessment: Report given to RN, Post -op Vital signs reviewed and stable, Patient moving all extremities and Patient moving all extremities X 4  Post vital signs: Reviewed and stable  Last Vitals:  Filed Vitals:   01/08/16 0650  BP: 136/66  Pulse: 68  Temp: 36.4 C  Resp: 20    Last Pain: There were no vitals filed for this visit.       Complications: No apparent anesthesia complications

## 2016-01-08 NOTE — Progress Notes (Signed)
cbg 138 @0700    Will contack point of care

## 2016-01-08 NOTE — Progress Notes (Signed)
Occupational Therapy Evaluation Patient Details Name: Micheal [name]. MRN: [id number] DOB: 1941/07/06 Today's Date: 01/08/2016    History of Present Illness Patient is a 75 yo male s/p Posterior Lumbar Interbody Fusion Lumbar four-five    Clinical Impression   PTA, pt was independent with ADLs and used [facility] for mobility. Pt currently requires min guard assist for transfers and ADLs and verbal cues for safety and to adhere to back precautions at all times. Pt plans to d/c home with 24/7 assistance from his wife. Pt will benefit from continued acute OT to increase independence and safety with ADLs and mobility to allow for safe discharge home. Recommend 3in1, no OT follow up.    Follow Up Recommendations  No OT follow up;Supervision/Assistance - 24 hour    Equipment Recommendations  3 in 1 bedside comode    Recommendations for Other Services       Precautions / Restrictions Precautions Precautions: Back Precaution Booklet Issued: Yes (comment) Precaution Comments: verbally reviewed and recalled with patient and family Required Braces or Orthoses: Spinal Brace Spinal Brace: Lumbar corset;Applied in sitting position Restrictions Weight Bearing Restrictions: No      Mobility Bed Mobility Overal bed mobility: Needs Assistance Bed Mobility: Rolling;Sidelying to Sit Rolling: Min assist Sidelying to sit: Min assist       General bed mobility comments: Min assist for technique, VCs for positioning, assist to elevate trunk to upright  Transfers Overall transfer level: Needs assistance Equipment used: Rolling walker (2 wheeled) Transfers: Sit to/from Stand Sit to Stand: Min guard         General transfer comment: VCs for hand placement and safety, cues for controlled descent to chair    Balance Overall balance assessment: Needs assistance Sitting-balance support: No upper extremity supported;Feet supported Sitting balance-Leahy Scale: Good     Standing balance  support: Bilateral upper extremity supported;During functional activity Standing balance-Leahy Scale: Poor                              ADL Overall ADL's : Needs assistance/impaired     Grooming: Wash/dry hands;Min guard;Standing   Upper Body Bathing: Set up;Sitting   Lower Body Bathing: Set up;Sit to/from stand;Cueing for compensatory techniques Lower Body Bathing Details (indicate cue type and reason): able to cross ankle-over-knee Upper Body Dressing : Set up;Sitting   Lower Body Dressing: Set up;Sit to/from stand;Cueing for compensatory techniques Lower Body Dressing Details (indicate cue type and reason): able to cross ankle-over-knee Toilet Transfer: Min guard;Ambulation;BSC;RW;Cueing for safety Toilet Transfer Details (indicate cue type and reason): cues to feel BSC on back of legs before reaching back to sit Toileting- Clothing Manipulation and Hygiene: Min guard;Sit to/from stand       Functional mobility during ADLs: Min guard;Rolling walker General ADL Comments: Began education on precautions, brace wear protocol, and compensatory strategies for ADLs. Pt's wife present for OT eval.     Vision Vision Assessment?: No apparent visual deficits   Perception     Praxis      Pertinent Vitals/Pain Pain Assessment: 0-10 Pain Score: 9  Pain Location: back Pain Descriptors / Indicators: Aching;Operative site guarding Pain Intervention(s): Premedicated before session;Monitored during session;Limited activity within patient's tolerance;Repositioned     Hand Dominance Right   Extremity/Trunk Assessment Upper Extremity Assessment Upper Extremity Assessment: Overall WFL for tasks assessed (noted resting tremor in UEs at baseline)   Lower Extremity Assessment Lower Extremity Assessment: Overall WFL for tasks assessed (RLE  with noted external rotation position)   Cervical / Trunk Assessment Cervical / Trunk Assessment: Other exceptions Cervical / Trunk  Exceptions: s/p lumbar surgery   Communication Communication Communication: No difficulties   Cognition Arousal/Alertness: Awake/alert Behavior During Therapy: WFL for tasks assessed/performed Overall Cognitive Status: Within Functional Limits for tasks assessed                     General Comments       Exercises       Shoulder Instructions      Home Living Family/patient expects to be discharged to:: Private residence Living Arrangements: Spouse/significant other Available Help at Discharge: Family;Available 24 hours/day (for first 2 weeks) Type of Home: House Home Access: Stairs to enter [employer] of Steps: 2 Entrance Stairs-Rails: None Home Layout: One level     Bathroom Shower/Tub: Tub/shower unit Shower/tub characteristics: Curtain Biochemist, clinical: Standard     Home Equipment: Cane - single point;Shower seat;Grab bars - tub/shower          Prior Functioning/Environment Level of Independence: Independent with assistive device(s)        Comments: used cane prior to admission    OT Diagnosis: Acute pain   OT Problem List: Decreased strength;Decreased range of motion;Decreased activity tolerance;Impaired balance (sitting and/or standing);Decreased safety awareness;Decreased knowledge of use of DME or AE;Decreased knowledge of precautions;Pain   OT Treatment/Interventions: Self-care/ADL training;Therapeutic exercise;Energy conservation;DME and/or AE instruction;Therapeutic activities;Patient/family education;Balance training    OT Goals(Current goals can be found in the care plan section) Acute Rehab OT Goals Patient Stated Goal: to get back on my motorcycle OT Goal Formulation: With patient Time For Goal Achievement: 01/22/16 ADL Goals Pt Will Perform Lower Body Bathing: with modified independence;sit to/from stand Pt Will Perform Lower Body Dressing: with modified independence;sit to/from stand Pt Will Transfer to Toilet: with  modified independence;ambulating;bedside commode (over toilet) Pt Will Perform Toileting - Clothing Manipulation and hygiene: with modified independence;sitting/lateral leans;sit to/from stand Pt Will Perform Tub/Shower Transfer: Tub transfer;with supervision;ambulating;shower seat;rolling walker Additional ADL Goal #1: Pt will independently don/doff back brace to increase independence with ADLs. Additional ADL Goal #2: Pt will verbalize and demonstrate adherence 3/3 back precautions with no verbal cues.  OT Frequency: Min 2X/week   Barriers to D/C:            Co-evaluation PT/OT/SLP Co-Evaluation/Treatment: Yes Reason for Co-Treatment: For patient/therapist safety PT goals addressed during session: Mobility/safety with mobility OT goals addressed during session: ADL's and self-care;Proper use of Adaptive equipment and DME      End of Session Equipment Utilized During Treatment: Gait belt;Rolling walker;Back brace Nurse Communication: Mobility status  Activity Tolerance: Patient tolerated treatment well Patient left: in chair;with call bell/phone within reach;with family/visitor present   Time: [id number] OT Time Calculation (min): 29 min Charges:  OT General Charges $OT Visit: 1 Procedure OT Evaluation $OT Eval Moderate Complexity: 1 Procedure G-Codes:    Redmond Baseman, OTR/L PagerUD:[phone] 01/08/2016, 5:11 PM

## 2016-01-08 NOTE — Anesthesia Postprocedure Evaluation (Signed)
Anesthesia Post Note  Patient: Micheal [name].  Procedure(s) Performed: Procedure(s) (LRB): Posterior Lumbar Interbody Fusion Lumbar four-five (N/A)  Patient location during evaluation: PACU Anesthesia Type: General Level of consciousness: awake and alert, oriented and patient cooperative Pain management: pain level controlled Vital Signs Assessment: post-procedure vital signs reviewed and stable Respiratory status: spontaneous breathing, nonlabored ventilation, respiratory function stable and patient connected to nasal cannula oxygen Cardiovascular status: blood pressure returned to baseline and stable Postop Assessment: no signs of nausea or vomiting Anesthetic complications: no    Last Vitals:  Filed Vitals:   01/08/16 1123 01/08/16 1126  BP: 135/64 147/66  Pulse: 95 94  Temp:  36.4 C  Resp: 18 42    Last Pain:  Filed Vitals:   01/08/16 1127  PainSc: Asleep                 Lycia Sachdeva,E. Kristin Barcus

## 2016-01-08 NOTE — Brief Op Note (Signed)
01/08/2016  10:06 AM  PATIENT:  Micheal [name].  75 y.o. male  PRE-OPERATIVE DIAGNOSIS:  Spondylolisthesis  POST-OPERATIVE DIAGNOSIS:  Spondylolisthesis  PROCEDURE:  Procedure(s): Posterior Lumbar Interbody Fusion Lumbar four-five (N/A)  SURGEON:  Surgeon(s) and Role:    * Earnie Larsson, MD - Primary  PHYSICIAN ASSISTANT:   ASSISTANTS: Nundkumar   ANESTHESIA:   general  EBL:  Total I/O In: 1100 [I.V.:1100] Out: 350 [Urine:250; Blood:100]  BLOOD ADMINISTERED:none  DRAINS: none   LOCAL MEDICATIONS USED:  MARCAINE     SPECIMEN:  No Specimen  DISPOSITION OF SPECIMEN:  N/A  COUNTS:  YES  TOURNIQUET:  * No tourniquets in log *  DICTATION: .Dragon Dictation  PLAN OF CARE: Admit to inpatient   PATIENT DISPOSITION:  PACU - hemodynamically stable.   Delay start of Pharmacological VTE agent (>24hrs) due to surgical blood loss or risk of bleeding: yes

## 2016-01-08 NOTE — Evaluation (Signed)
Physical Therapy Evaluation Patient Details Name: Micheal [name]. MRN: [id number] DOB: 1940-11-28 Today's Date: 01/08/2016   History of Present Illness  Patient is a 75 yo male s/p Posterior Lumbar Interbody Fusion Lumbar four-five   Clinical Impression  Patient demonstrates deficits in functional mobility as indicated below. Will need continued skilled PT to address deficits and maximize function. Will see as indicated and progress as tolerated. Patient educated on back precautions, mobility expectations, and safety with mobility.     Follow Up Recommendations No PT follow up;Supervision/Assistance - 24 hour    Equipment Recommendations  Rolling walker with 5" wheels    Recommendations for Other Services       Precautions / Restrictions Precautions Precautions: Back Precaution Booklet Issued: Yes (comment) Precaution Comments: verbally reviewed and recalled with patient and family Required Braces or Orthoses: Spinal Brace Spinal Brace: Lumbar corset Restrictions Weight Bearing Restrictions: No      Mobility  Bed Mobility Overal bed mobility: Needs Assistance Bed Mobility: Rolling;Sidelying to Sit Rolling: Min assist Sidelying to sit: Min assist       General bed mobility comments: Min assist for technique, VCs for positioning, assist to elevate trunk to upright  Transfers Overall transfer level: Needs assistance Equipment used: Rolling walker (2 wheeled) Transfers: Sit to/from Stand Sit to Stand: Min guard         General transfer comment: VCs for hand placement and safety, cues for controlled descent to chair  Ambulation/Gait Ambulation/Gait assistance: Min guard Ambulation Distance (Feet): 90 Feet Assistive device: Rolling walker (2 wheeled) Gait Pattern/deviations: Step-through pattern;Decreased stride length;Decreased dorsiflexion - right;Drifts right/left;Trunk flexed;Narrow base of support Gait velocity: decreased Gait velocity interpretation:  <1.8 ft/sec, indicative of risk for recurrent falls General Gait Details: noted toe out gait RLE, cues for positioning and postural alignment. Min guard for safety with VCs for increased cadence during mobility. patient cautious and guarded  Stairs            Wheelchair Mobility    Modified Rankin (Stroke Patients Only)       Balance                                             Pertinent Vitals/Pain Pain Assessment: 0-10 Pain Score: 9  (9 and 1/2) Pain Location: back Pain Descriptors / Indicators: Operative site guarding Pain Intervention(s): Limited activity within patient's tolerance;Monitored during session;Premedicated before session;Repositioned;Relaxation    Home Living Family/patient expects to be discharged to:: Private residence Living Arrangements: Spouse/significant other Available Help at Discharge: Family;Available 24 hours/day (for first 2 wks) Type of Home: House Home Access: Stairs to enter Entrance Stairs-Rails: None Entrance Stairs-Number of Steps: 2 Home Layout: One level Home Equipment: Cane - single point;Shower seat;Grab bars - tub/shower      Prior Function Level of Independence: Independent with assistive device(s)         Comments: used cane prior to admission     Hand Dominance   Dominant Hand: Right    Extremity/Trunk Assessment   Upper Extremity Assessment: Overall WFL for tasks assessed (noted resting tremor in UEs at baseline)           Lower Extremity Assessment: Overall WFL for tasks assessed (RLE with noted external rotation positioning)         Communication   Communication: No difficulties  Cognition Arousal/Alertness: Awake/alert Behavior During Therapy: WFL for tasks  assessed/performed Overall Cognitive Status: Within Functional Limits for tasks assessed                      General Comments General comments (skin integrity, edema, etc.): educated patient and family regarding back  precautions, activity expectations, safety with mobility and use of brace    Exercises        Assessment/Plan    PT Assessment Patient needs continued PT services  PT Diagnosis Difficulty walking;Abnormality of gait;Acute pain   PT Problem List Decreased strength;Decreased activity tolerance;Decreased balance;Decreased mobility;Decreased coordination;Decreased safety awareness;Decreased knowledge of precautions;Pain  PT Treatment Interventions DME instruction;Gait training;Stair training;Functional mobility training;Therapeutic activities;Therapeutic exercise;Balance training;Patient/family education   PT Goals (Current goals can be found in the Care Plan section) Acute Rehab PT Goals Patient Stated Goal: to go home PT Goal Formulation: With patient/family Time For Goal Achievement: 01/22/16 Potential to Achieve Goals: Good    Frequency Min 5X/week   Barriers to discharge        Co-evaluation PT/OT/SLP Co-Evaluation/Treatment: Yes Reason for Co-Treatment: For patient/therapist safety PT goals addressed during session: Mobility/safety with mobility         End of Session Equipment Utilized During Treatment: Gait belt Activity Tolerance: Patient tolerated treatment well Patient left: in chair;with call bell/phone within reach;with family/visitor present Nurse Communication: Mobility status    Functional Assessment Tool Used: clinical judgement Functional Limitation: Mobility: Walking and moving around Mobility: Walking and Moving Around Current Status [phone]): At least 20 percent but less than 40 percent impaired, limited or restricted Mobility: Walking and Moving Around Goal Status [phone]): At least 1 percent but less than 20 percent impaired, limited or restricted    Time: 1555-1621 PT Time Calculation (min) (ACUTE ONLY): 26 min   Charges:   PT Evaluation $PT Eval Moderate Complexity: 1 Procedure     PT G Codes:   PT G-Codes **NOT FOR INPATIENT  CLASS** Functional Assessment Tool Used: clinical judgement Functional Limitation: Mobility: Walking and moving around Mobility: Walking and Moving Around Current Status [id number]): At least 20 percent but less than 40 percent impaired, limited or restricted Mobility: Walking and Moving Around Goal Status [phone]): At least 1 percent but less than 20 percent impaired, limited or restricted    Duncan Dull 01/08/2016, 4:39 PM Alben Deeds, [place] DPT  [phone]

## 2016-01-08 NOTE — Anesthesia Procedure Notes (Signed)
Procedure Name: Intubation Date/Time: 01/08/2016 8:17 AM Performed by: Jacquiline Doe A Pre-anesthesia Checklist: Patient identified, Timeout performed, Emergency Drugs available, Suction available and Patient being monitored Patient Re-evaluated:Patient Re-evaluated prior to inductionOxygen Delivery Method: Circle system utilized Preoxygenation: Pre-oxygenation with 100% oxygen Intubation Type: IV induction and Cricoid Pressure applied Ventilation: Oral airway inserted - appropriate to patient size and Mask ventilation with difficulty Laryngoscope Size: Mac and 4 Grade View: Grade I Tube type: Oral Tube size: 7.5 mm Number of attempts: 1 Airway Equipment and Method: Stylet Placement Confirmation: ETT inserted through vocal cords under direct vision,  breath sounds checked- equal and bilateral and positive ETCO2 Secured at: 23 cm Tube secured with: Tape Dental Injury: Teeth and Oropharynx as per pre-operative assessment

## 2016-01-08 NOTE — Op Note (Signed)
Date of procedure: 01/08/2016   Date of dictation: Same  Service: Neurosurgery  Preoperative diagnosis: L4-L5 degenerative spondylolisthesis with stenosis and neurogenic claudication  Postoperative diagnosis: Same  Procedure Name: Bilateral L4-L5 decompressive laminotomies with bilateral L4 and L5 decompressive foraminotomies, more than would be required for simple interbody fusion alone.  L4-L5 posterior lumbar interbody fusion utilizing interbody expandable cages and local autograft  L4-5 posterior lateral arthrodesis utilizing nonsegmental pedicle screw instrumentation and local autograft.  Surgeon:Sirus Labrie A.Oseias Horsey, M.D.  Asst. Surgeon: Kathyrn Sheriff   Anesthesia: General  Indication: 75 year old male with severe back and bilateral lower extremity pain paresthesias and weakness consistent with neurogenic claudication failing conservative management. Workup demonstrates evidence of a grade 1 degenerative spondylolisthesis with marked stenosis. Patient presents now for decompression and fusion in hopes of improving his symptoms.  Operative note: After induction of anesthesia, patient position prone onto Wilson frame and a properly padded. Lumbar region prepped and draped sterilely. Incision made overlying L4-5. Dissection performed bilaterally. Retractor placed. Fluoroscopy used. Levels confirmed. Decompressive laminotomies and facetectomies were performed bilaterally using Leksell rongeurs Kerrison rongeurs and a high-speed drill to remove the majority the inferior lamina of L4 the entire inferior facet of L4 the majority the superior facet of L5 and the superior aspect the lamina of L5. Ligament flavum was elevated and resected. Decompressive foraminotomies were completed along the course the L4 and L5 nerve roots inaccessible would what would be required for simple interbody fusion alone. Epidermides plexus was coagulated and cut. Bilateral discectomies were performed. Disc space was distracted  and a 10 mm expandable cage was found to be most appropriate. Preparations to the left-sided disc space were made with her distractor on patient's right side. After the disc space was adequately cleaned a 10 mm lordotic Medtronic expandable cage packed with local autograft was impacted into place and expanded to its full extent. Was removed patient's right side. Thecal sac and nerve roots protected on the right side area disc space once again prepared for interbody fusion. Morselize autograft packed into the interspace. A second 10 mm x 12 cage was then packed into place and expanded into its full extent. Pedicles of L4 and L5 were then verified using surface landmarks and intraoperative fluoroscopy. Superficial bone overlying the pedicle was removed using high-speed drill. Each pedicle was then probed using a pedicle awl each pedicle awl track was then tapped with a screw tap. Each screw tap hole was probed and found to be solidly within the bone. 6.5 mm radius brand screws from [employer] were placed bilaterally at L4 and L5. Transverse processes and lateral gutters were prepared for fusion. Final images revealed good position of the bone graft and hardware at the proper upper level with normal alignment of the spine. Wound is irrigated one final time. Rods were placed over the screws. Locking caps and placed over the screw heads and given a final tightening. Construct appeared solid without complication. Vancomycin powder was placed the deep wound space. Wounds and close in layers of Vicryl sutures. No apparent complications. Patient tolerated the procedure well and he returns to the recovery room postop.

## 2016-01-09 LAB — GLUCOSE, CAPILLARY: GLUCOSE-CAPILLARY: 136 mg/dL — AB (ref 65–99)

## 2016-01-09 MED ORDER — DIAZEPAM 5 MG PO TABS: 5.0000 mg | ORAL_TABLET | Freq: Four times a day (QID) | ORAL | Status: DC | PRN

## 2016-01-09 MED ORDER — OXYCODONE-ACETAMINOPHEN 5-325 MG PO TABS: 1.0000 | ORAL_TABLET | ORAL | Status: DC | PRN

## 2016-01-09 NOTE — Progress Notes (Addendum)
Occupational Therapy Treatment Patient Details Name: Micheal [name]. MRN: [id number] DOB: August 02, 1941 Today's Date: 01/09/2016    History of present illness Patient is a 75 y.o. male s/p Posterior Lumbar Interbody Fusion Lumbar four-five    OT comments  Education provided in session. OT signing off.  Follow Up Recommendations  No OT follow up;Supervision/Assistance - 24 hour    Equipment Recommendations  3 in 1 bedside comode;Other (comment) (sock aid if wanted)    Recommendations for Other Services      Precautions / Restrictions Precautions Precautions: Back Precaution Booklet Issued: No Precaution Comments: reviewed back precautions Required Braces or Orthoses: Spinal Brace Spinal Brace: Lumbar corset (already on in session) Restrictions Weight Bearing Restrictions: No       Mobility Bed Mobility   General bed mobility comments: not performed  Transfers Overall transfer level: Needs assistance Transfers: Sit to/from Stand Sit to Stand: Min guard         General transfer comment: RW in front of pt    Balance    Able to perform simulated tub transfer without LOB.                 ADL Overall ADL's : Needs assistance/impaired                     Lower Body Dressing: Sitting/lateral leans;With adaptive equipment;Minimal assistance (with AE) Lower Body Dressing Details (indicate cue type and reason): donned/doffed sock; OT did help unstick reacher for pt Toilet Transfer: Ambulation;RW;Min guard (sit to stand from chair)       Tub/ Shower Transfer: Min guard;Ambulation;Rolling walker (used RW to ambulate close to simulated tub)   Functional mobility during ADLs: Min guard;Rolling walker (ambulated with and without RW; supervision-min guard for ambulation) General ADL Comments: Discussed incorporating precautions into functional activities. Discussed information about back brace. Educated on safety such as rugs on floor and use of bag on  walker as well as safe footwear.  Educated on AE and explained what pt could use for toilet aid if needed.      Vision                     Perception     Praxis      Cognition  Awake/Alert Behavior During Therapy: WFL for tasks assessed/performed Overall Cognitive Status: Within Functional Limits for tasks assessed                       Extremity/Trunk Assessment               Exercises     Shoulder Instructions       General Comments      Pertinent Vitals/ Pain       Pain Assessment: 0-10 Pain Score:  (7.5) Faces Pain Scale: Hurts little more Pain Location: back Pain Descriptors / Indicators: Operative site guarding;Sore Pain Intervention(s): Monitored during session  Home Living                                          Prior Functioning/Environment              Frequency Min 2X/week     Progress Toward Goals  OT Goals(current goals can now be found in the care plan section)  Progress towards OT goals: Progressing toward goals-adequate for d/c  Acute Rehab  OT Goals Patient Stated Goal: go home OT Goal Formulation: With patient Time For Goal Achievement: 01/22/16 ADL Goals Pt Will Perform Lower Body Bathing: with modified independence;sit to/from stand Pt Will Perform Lower Body Dressing: with modified independence;sit to/from stand Pt Will Transfer to Toilet: with modified independence;ambulating;bedside commode (over toilet) Pt Will Perform Toileting - Clothing Manipulation and hygiene: with modified independence;sitting/lateral leans;sit to/from stand Pt Will Perform Tub/Shower Transfer: Tub transfer;with supervision;ambulating;shower seat;rolling walker Additional ADL Goal #1: Pt will independently don/doff back brace to increase independence with ADLs. Additional ADL Goal #2: Pt will verbalize and demonstrate adherence 3/3 back precautions with no verbal cues.  Plan Discharge plan remains  appropriate;Equipment recommendations need to be updated    Co-evaluation                 End of Session Equipment Utilized During Treatment: Rolling walker;Back brace;Other (comment) (AE)   Activity Tolerance Patient tolerated treatment well   Patient Left in chair   Nurse Communication          Time: [phone] OT Time Calculation (min): 20 min  Charges: OT General Charges $OT Visit: 1 Procedure OT Treatments $Self Care/Home Management : 8-22 mins  Benito Mccreedy OTR/L [id number] 01/09/2016, 9:26 AM

## 2016-01-09 NOTE — Discharge Instructions (Signed)
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
## 2016-01-09 NOTE — Progress Notes (Signed)
Pt and wife given D/C instructions with Rx's, verbal understanding was provided. Pt's incision is clean and dry with no sign of infection. Pt's IV was removed prior to D/C. Pt received RW and 3-n-1 from [place] prior to D/C. Pt D/C'd home via wheelchair per MD order. Pt is stable @ D/C and has no other needs at this time. Holli Humbles, RN

## 2016-01-09 NOTE — Discharge Summary (Signed)
Physician Discharge Summary  Patient ID: Micheal [name]. MRN: [id number] DOB/AGE: Apr 28, 1941 75 y.o.  Admit date: 01/08/2016 Discharge date: 01/09/2016  Admission Diagnoses:  Discharge Diagnoses:  Active Problems:   Degenerative spondylolisthesis   Discharged Condition: good  Hospital Course: Patient admitted to the hospital where he underwent uncomplicated [id number] decompression and fusion. Postoperatively he is doing very well. Preoperative back and leg pain much improved. Standing and walking without difficulty. Patient feels ready for discharge home.  Consults:   Significant Diagnostic Studies:   Treatments:   Discharge Exam: Blood pressure 126/48, pulse 78, temperature 97.5 F (36.4 C), temperature source Oral, resp. rate 18, weight 87.091 kg (192 lb), SpO2 99 %. Awake and alert. Oriented and appropriate. Cranial nerve function intact. Motor sensory function extremities normal. Wound clean and dry. Chest and abdomen benign.  Disposition:      Medication List    TAKE these medications        amLODipine 10 MG tablet  Commonly known as:  NORVASC  Take 10 mg by mouth daily.     aspirin 81 MG tablet  Take 81 mg by mouth daily.     carbidopa-levodopa 25-100 MG tablet  Commonly known as:  SINEMET IR  Take 1 tablet by mouth 3 (three) times daily.     diazepam 5 MG tablet  Commonly known as:  VALIUM  Take 1-2 tablets (5-10 mg total) by mouth every 6 (six) hours as needed for muscle spasms.     gabapentin 100 MG capsule  Commonly known as:  NEURONTIN  Take 100 mg by mouth at bedtime as needed (for pain).     glucosamine-chondroitin 500-400 MG tablet  Take 1 tablet by mouth daily.     HYDROcodone-acetaminophen 7.5-325 MG tablet  Commonly known as:  NORCO  Take 1 tablet by mouth 2 (two) times daily as needed for moderate pain.     ibuprofen 200 MG tablet  Commonly known as:  ADVIL,MOTRIN  Take 400 mg by mouth 2 (two) times daily as needed for moderate pain.     metFORMIN 850 MG tablet  Commonly known as:  GLUCOPHAGE  Take 850 mg by mouth 3 (three) times daily.     multivitamin tablet  Take 1 tablet by mouth daily.     naphazoline-glycerin 0.012-0.2 % Soln  Commonly known as:  CLEAR EYES  Place 1-2 drops into both eyes daily as needed for irritation.     olopatadine 0.1 % ophthalmic solution  Commonly known as:  PATANOL  Place 1 drop into both eyes at bedtime as needed for allergies.     OMEGA-3 KRILL OIL PO  Take 350 mg by mouth daily.     omeprazole 40 MG capsule  Commonly known as:  PRILOSEC  Take 40 mg by mouth daily.     oxyCODONE-acetaminophen 5-325 MG tablet  Commonly known as:  PERCOCET/ROXICET  Take 1-2 tablets by mouth every 4 (four) hours as needed for moderate pain.     polycarbophil 625 MG tablet  Commonly known as:  FIBERCON  Take 625 mg by mouth every other day.     rosuvastatin 10 MG tablet  Commonly known as:  CRESTOR  Take 10 mg by mouth daily.     tamsulosin 0.4 MG Caps capsule  Commonly known as:  FLOMAX  Take 0.4 mg by mouth daily after breakfast.     tetrahydrozoline-zinc 0.05-0.25 % ophthalmic solution  Commonly known as:  VISINE-AC  Place 1 drop into both eyes daily  as needed (for dry eyes).     valsartan 320 MG tablet  Commonly known as:  DIOVAN  Take 320 mg by mouth daily.           Follow-up Information    Schedule an appointment as soon as possible for a visit with Charlie Pitter, MD.   Specialty:  Neurosurgery   Contact information:   [address] [place] [address] [phone]       Signed: Charlie Pitter 01/09/2016, 9:36 AM

## 2016-03-14 DIAGNOSIS — H2513 Age-related nuclear cataract, bilateral: Secondary | ICD-10-CM | POA: Diagnosis not present

## 2016-03-14 DIAGNOSIS — H524 Presbyopia: Secondary | ICD-10-CM | POA: Diagnosis not present

## 2016-03-14 DIAGNOSIS — E119 Type 2 diabetes mellitus without complications: Secondary | ICD-10-CM | POA: Diagnosis not present

## 2016-08-14 DIAGNOSIS — M545 Low back pain: Secondary | ICD-10-CM | POA: Diagnosis not present

## 2016-08-14 DIAGNOSIS — N400 Enlarged prostate without lower urinary tract symptoms: Secondary | ICD-10-CM | POA: Diagnosis not present

## 2016-08-14 DIAGNOSIS — K219 Gastro-esophageal reflux disease without esophagitis: Secondary | ICD-10-CM | POA: Diagnosis not present

## 2016-08-14 DIAGNOSIS — E7800 Pure hypercholesterolemia, unspecified: Secondary | ICD-10-CM | POA: Diagnosis not present

## 2016-08-14 DIAGNOSIS — G20 Parkinson's disease: Secondary | ICD-10-CM | POA: Diagnosis not present

## 2016-08-14 DIAGNOSIS — I10 Essential (primary) hypertension: Secondary | ICD-10-CM | POA: Diagnosis not present

## 2016-08-14 DIAGNOSIS — E119 Type 2 diabetes mellitus without complications: Secondary | ICD-10-CM | POA: Diagnosis not present

## 2016-08-14 DIAGNOSIS — I2510 Atherosclerotic heart disease of native coronary artery without angina pectoris: Secondary | ICD-10-CM | POA: Diagnosis not present

## 2016-08-14 DIAGNOSIS — J209 Acute bronchitis, unspecified: Secondary | ICD-10-CM | POA: Diagnosis not present

## 2016-08-14 DIAGNOSIS — I6789 Other cerebrovascular disease: Secondary | ICD-10-CM | POA: Diagnosis not present

## 2016-08-14 DIAGNOSIS — E1140 Type 2 diabetes mellitus with diabetic neuropathy, unspecified: Secondary | ICD-10-CM | POA: Diagnosis not present

## 2016-09-03 DIAGNOSIS — J208 Acute bronchitis due to other specified organisms: Secondary | ICD-10-CM | POA: Diagnosis not present

## 2016-09-03 DIAGNOSIS — I2510 Atherosclerotic heart disease of native coronary artery without angina pectoris: Secondary | ICD-10-CM | POA: Diagnosis not present

## 2016-09-03 DIAGNOSIS — M545 Low back pain: Secondary | ICD-10-CM | POA: Diagnosis not present

## 2016-09-03 DIAGNOSIS — G20 Parkinson's disease: Secondary | ICD-10-CM | POA: Diagnosis not present

## 2016-09-03 DIAGNOSIS — I6789 Other cerebrovascular disease: Secondary | ICD-10-CM | POA: Diagnosis not present

## 2016-09-03 DIAGNOSIS — E1140 Type 2 diabetes mellitus with diabetic neuropathy, unspecified: Secondary | ICD-10-CM | POA: Diagnosis not present

## 2016-09-03 DIAGNOSIS — E119 Type 2 diabetes mellitus without complications: Secondary | ICD-10-CM | POA: Diagnosis not present

## 2016-09-03 DIAGNOSIS — M159 Polyosteoarthritis, unspecified: Secondary | ICD-10-CM | POA: Diagnosis not present

## 2016-09-03 DIAGNOSIS — I10 Essential (primary) hypertension: Secondary | ICD-10-CM | POA: Diagnosis not present

## 2016-09-03 DIAGNOSIS — N400 Enlarged prostate without lower urinary tract symptoms: Secondary | ICD-10-CM | POA: Diagnosis not present

## 2016-09-03 DIAGNOSIS — K219 Gastro-esophageal reflux disease without esophagitis: Secondary | ICD-10-CM | POA: Diagnosis not present

## 2016-09-03 DIAGNOSIS — E7800 Pure hypercholesterolemia, unspecified: Secondary | ICD-10-CM | POA: Diagnosis not present

## 2016-09-11 DIAGNOSIS — E7800 Pure hypercholesterolemia, unspecified: Secondary | ICD-10-CM | POA: Diagnosis not present

## 2016-09-11 DIAGNOSIS — J208 Acute bronchitis due to other specified organisms: Secondary | ICD-10-CM | POA: Diagnosis not present

## 2016-09-11 DIAGNOSIS — E1140 Type 2 diabetes mellitus with diabetic neuropathy, unspecified: Secondary | ICD-10-CM | POA: Diagnosis not present

## 2016-09-11 DIAGNOSIS — E119 Type 2 diabetes mellitus without complications: Secondary | ICD-10-CM | POA: Diagnosis not present

## 2016-09-11 DIAGNOSIS — K219 Gastro-esophageal reflux disease without esophagitis: Secondary | ICD-10-CM | POA: Diagnosis not present

## 2016-09-11 DIAGNOSIS — M159 Polyosteoarthritis, unspecified: Secondary | ICD-10-CM | POA: Diagnosis not present

## 2016-09-11 DIAGNOSIS — M545 Low back pain: Secondary | ICD-10-CM | POA: Diagnosis not present

## 2016-09-11 DIAGNOSIS — N400 Enlarged prostate without lower urinary tract symptoms: Secondary | ICD-10-CM | POA: Diagnosis not present

## 2016-09-11 DIAGNOSIS — I2510 Atherosclerotic heart disease of native coronary artery without angina pectoris: Secondary | ICD-10-CM | POA: Diagnosis not present

## 2016-09-11 DIAGNOSIS — I6789 Other cerebrovascular disease: Secondary | ICD-10-CM | POA: Diagnosis not present

## 2016-09-11 DIAGNOSIS — G20 Parkinson's disease: Secondary | ICD-10-CM | POA: Diagnosis not present

## 2016-09-11 DIAGNOSIS — I10 Essential (primary) hypertension: Secondary | ICD-10-CM | POA: Diagnosis not present

## 2016-09-12 DIAGNOSIS — J208 Acute bronchitis due to other specified organisms: Secondary | ICD-10-CM | POA: Diagnosis not present

## 2016-09-12 DIAGNOSIS — R05 Cough: Secondary | ICD-10-CM | POA: Diagnosis not present

## 2016-09-12 DIAGNOSIS — R079 Chest pain, unspecified: Secondary | ICD-10-CM | POA: Diagnosis not present

## 2016-09-17 DIAGNOSIS — N400 Enlarged prostate without lower urinary tract symptoms: Secondary | ICD-10-CM | POA: Diagnosis not present

## 2016-09-17 DIAGNOSIS — J208 Acute bronchitis due to other specified organisms: Secondary | ICD-10-CM | POA: Diagnosis not present

## 2016-09-17 DIAGNOSIS — Z6829 Body mass index (BMI) 29.0-29.9, adult: Secondary | ICD-10-CM | POA: Diagnosis not present

## 2016-09-17 DIAGNOSIS — E1140 Type 2 diabetes mellitus with diabetic neuropathy, unspecified: Secondary | ICD-10-CM | POA: Diagnosis not present

## 2016-09-17 DIAGNOSIS — I2510 Atherosclerotic heart disease of native coronary artery without angina pectoris: Secondary | ICD-10-CM | POA: Diagnosis not present

## 2016-09-17 DIAGNOSIS — K219 Gastro-esophageal reflux disease without esophagitis: Secondary | ICD-10-CM | POA: Diagnosis not present

## 2016-09-17 DIAGNOSIS — G20 Parkinson's disease: Secondary | ICD-10-CM | POA: Diagnosis not present

## 2016-09-17 DIAGNOSIS — I6789 Other cerebrovascular disease: Secondary | ICD-10-CM | POA: Diagnosis not present

## 2016-09-17 DIAGNOSIS — E663 Overweight: Secondary | ICD-10-CM | POA: Diagnosis not present

## 2016-09-26 DIAGNOSIS — M159 Polyosteoarthritis, unspecified: Secondary | ICD-10-CM | POA: Diagnosis not present

## 2016-09-26 DIAGNOSIS — I2510 Atherosclerotic heart disease of native coronary artery without angina pectoris: Secondary | ICD-10-CM | POA: Diagnosis not present

## 2016-09-26 DIAGNOSIS — G20 Parkinson's disease: Secondary | ICD-10-CM | POA: Diagnosis not present

## 2016-09-26 DIAGNOSIS — I10 Essential (primary) hypertension: Secondary | ICD-10-CM | POA: Diagnosis not present

## 2016-09-26 DIAGNOSIS — E119 Type 2 diabetes mellitus without complications: Secondary | ICD-10-CM | POA: Diagnosis not present

## 2016-09-26 DIAGNOSIS — R251 Tremor, unspecified: Secondary | ICD-10-CM | POA: Diagnosis not present

## 2016-09-26 DIAGNOSIS — E7800 Pure hypercholesterolemia, unspecified: Secondary | ICD-10-CM | POA: Diagnosis not present

## 2016-09-26 DIAGNOSIS — I6789 Other cerebrovascular disease: Secondary | ICD-10-CM | POA: Diagnosis not present

## 2016-09-26 DIAGNOSIS — N400 Enlarged prostate without lower urinary tract symptoms: Secondary | ICD-10-CM | POA: Diagnosis not present

## 2016-09-26 DIAGNOSIS — K219 Gastro-esophageal reflux disease without esophagitis: Secondary | ICD-10-CM | POA: Diagnosis not present

## 2016-09-26 DIAGNOSIS — M545 Low back pain: Secondary | ICD-10-CM | POA: Diagnosis not present

## 2016-09-26 DIAGNOSIS — E1140 Type 2 diabetes mellitus with diabetic neuropathy, unspecified: Secondary | ICD-10-CM | POA: Diagnosis not present

## 2016-10-06 DIAGNOSIS — L578 Other skin changes due to chronic exposure to nonionizing radiation: Secondary | ICD-10-CM | POA: Diagnosis not present

## 2016-10-06 DIAGNOSIS — L820 Inflamed seborrheic keratosis: Secondary | ICD-10-CM | POA: Diagnosis not present

## 2016-10-06 DIAGNOSIS — L821 Other seborrheic keratosis: Secondary | ICD-10-CM | POA: Diagnosis not present

## 2016-10-08 DIAGNOSIS — M2041 Other hammer toe(s) (acquired), right foot: Secondary | ICD-10-CM

## 2016-10-08 DIAGNOSIS — M2042 Other hammer toe(s) (acquired), left foot: Secondary | ICD-10-CM | POA: Diagnosis not present

## 2016-10-08 DIAGNOSIS — M2170 Unequal limb length (acquired), unspecified site: Secondary | ICD-10-CM | POA: Insufficient documentation

## 2016-10-08 DIAGNOSIS — E119 Type 2 diabetes mellitus without complications: Secondary | ICD-10-CM | POA: Diagnosis not present

## 2016-10-08 HISTORY — DX: Other hammer toe(s) (acquired), right foot: M20.41

## 2016-10-08 HISTORY — DX: Unequal limb length (acquired), unspecified site: M21.70

## 2016-10-24 DIAGNOSIS — K219 Gastro-esophageal reflux disease without esophagitis: Secondary | ICD-10-CM | POA: Diagnosis not present

## 2016-10-24 DIAGNOSIS — N400 Enlarged prostate without lower urinary tract symptoms: Secondary | ICD-10-CM | POA: Diagnosis not present

## 2016-10-24 DIAGNOSIS — R251 Tremor, unspecified: Secondary | ICD-10-CM | POA: Diagnosis not present

## 2016-10-24 DIAGNOSIS — M545 Low back pain: Secondary | ICD-10-CM | POA: Diagnosis not present

## 2016-10-24 DIAGNOSIS — G20 Parkinson's disease: Secondary | ICD-10-CM | POA: Diagnosis not present

## 2016-10-24 DIAGNOSIS — E7800 Pure hypercholesterolemia, unspecified: Secondary | ICD-10-CM | POA: Diagnosis not present

## 2016-10-24 DIAGNOSIS — E119 Type 2 diabetes mellitus without complications: Secondary | ICD-10-CM | POA: Diagnosis not present

## 2016-10-24 DIAGNOSIS — E1140 Type 2 diabetes mellitus with diabetic neuropathy, unspecified: Secondary | ICD-10-CM | POA: Diagnosis not present

## 2016-10-24 DIAGNOSIS — I10 Essential (primary) hypertension: Secondary | ICD-10-CM | POA: Diagnosis not present

## 2016-10-24 DIAGNOSIS — M159 Polyosteoarthritis, unspecified: Secondary | ICD-10-CM | POA: Diagnosis not present

## 2016-10-24 DIAGNOSIS — I2510 Atherosclerotic heart disease of native coronary artery without angina pectoris: Secondary | ICD-10-CM | POA: Diagnosis not present

## 2016-10-24 DIAGNOSIS — I6789 Other cerebrovascular disease: Secondary | ICD-10-CM | POA: Diagnosis not present

## 2016-11-07 DIAGNOSIS — E7800 Pure hypercholesterolemia, unspecified: Secondary | ICD-10-CM | POA: Diagnosis not present

## 2016-11-07 DIAGNOSIS — J208 Acute bronchitis due to other specified organisms: Secondary | ICD-10-CM | POA: Diagnosis not present

## 2016-11-07 DIAGNOSIS — I10 Essential (primary) hypertension: Secondary | ICD-10-CM | POA: Diagnosis not present

## 2016-11-07 DIAGNOSIS — R251 Tremor, unspecified: Secondary | ICD-10-CM | POA: Diagnosis not present

## 2016-11-07 DIAGNOSIS — K219 Gastro-esophageal reflux disease without esophagitis: Secondary | ICD-10-CM | POA: Diagnosis not present

## 2016-11-07 DIAGNOSIS — E1140 Type 2 diabetes mellitus with diabetic neuropathy, unspecified: Secondary | ICD-10-CM | POA: Diagnosis not present

## 2016-11-07 DIAGNOSIS — M545 Low back pain: Secondary | ICD-10-CM | POA: Diagnosis not present

## 2016-11-07 DIAGNOSIS — N400 Enlarged prostate without lower urinary tract symptoms: Secondary | ICD-10-CM | POA: Diagnosis not present

## 2016-11-07 DIAGNOSIS — E119 Type 2 diabetes mellitus without complications: Secondary | ICD-10-CM | POA: Diagnosis not present

## 2016-11-07 DIAGNOSIS — G20 Parkinson's disease: Secondary | ICD-10-CM | POA: Diagnosis not present

## 2016-11-07 DIAGNOSIS — M159 Polyosteoarthritis, unspecified: Secondary | ICD-10-CM | POA: Diagnosis not present

## 2016-11-07 DIAGNOSIS — I2510 Atherosclerotic heart disease of native coronary artery without angina pectoris: Secondary | ICD-10-CM | POA: Diagnosis not present

## 2016-11-07 DIAGNOSIS — I6789 Other cerebrovascular disease: Secondary | ICD-10-CM | POA: Diagnosis not present

## 2016-12-05 DIAGNOSIS — G20 Parkinson's disease: Secondary | ICD-10-CM | POA: Diagnosis not present

## 2016-12-05 DIAGNOSIS — K219 Gastro-esophageal reflux disease without esophagitis: Secondary | ICD-10-CM | POA: Diagnosis not present

## 2016-12-05 DIAGNOSIS — M545 Low back pain: Secondary | ICD-10-CM | POA: Diagnosis not present

## 2016-12-05 DIAGNOSIS — N400 Enlarged prostate without lower urinary tract symptoms: Secondary | ICD-10-CM | POA: Diagnosis not present

## 2016-12-05 DIAGNOSIS — E119 Type 2 diabetes mellitus without complications: Secondary | ICD-10-CM | POA: Diagnosis not present

## 2016-12-05 DIAGNOSIS — I10 Essential (primary) hypertension: Secondary | ICD-10-CM | POA: Diagnosis not present

## 2016-12-05 DIAGNOSIS — I6789 Other cerebrovascular disease: Secondary | ICD-10-CM | POA: Diagnosis not present

## 2016-12-05 DIAGNOSIS — R251 Tremor, unspecified: Secondary | ICD-10-CM | POA: Diagnosis not present

## 2016-12-05 DIAGNOSIS — I2510 Atherosclerotic heart disease of native coronary artery without angina pectoris: Secondary | ICD-10-CM | POA: Diagnosis not present

## 2016-12-05 DIAGNOSIS — E7800 Pure hypercholesterolemia, unspecified: Secondary | ICD-10-CM | POA: Diagnosis not present

## 2016-12-05 DIAGNOSIS — M159 Polyosteoarthritis, unspecified: Secondary | ICD-10-CM | POA: Diagnosis not present

## 2016-12-05 DIAGNOSIS — E1140 Type 2 diabetes mellitus with diabetic neuropathy, unspecified: Secondary | ICD-10-CM | POA: Diagnosis not present

## 2016-12-11 DIAGNOSIS — I6789 Other cerebrovascular disease: Secondary | ICD-10-CM | POA: Diagnosis not present

## 2016-12-11 DIAGNOSIS — I10 Essential (primary) hypertension: Secondary | ICD-10-CM | POA: Diagnosis not present

## 2016-12-11 DIAGNOSIS — M545 Low back pain: Secondary | ICD-10-CM | POA: Diagnosis not present

## 2016-12-11 DIAGNOSIS — I2510 Atherosclerotic heart disease of native coronary artery without angina pectoris: Secondary | ICD-10-CM | POA: Diagnosis not present

## 2016-12-11 DIAGNOSIS — G20 Parkinson's disease: Secondary | ICD-10-CM | POA: Diagnosis not present

## 2016-12-11 DIAGNOSIS — M159 Polyosteoarthritis, unspecified: Secondary | ICD-10-CM | POA: Diagnosis not present

## 2016-12-11 DIAGNOSIS — E669 Obesity, unspecified: Secondary | ICD-10-CM | POA: Diagnosis not present

## 2016-12-11 DIAGNOSIS — K219 Gastro-esophageal reflux disease without esophagitis: Secondary | ICD-10-CM | POA: Diagnosis not present

## 2016-12-11 DIAGNOSIS — E7800 Pure hypercholesterolemia, unspecified: Secondary | ICD-10-CM | POA: Diagnosis not present

## 2016-12-11 DIAGNOSIS — M79605 Pain in left leg: Secondary | ICD-10-CM | POA: Diagnosis not present

## 2016-12-11 DIAGNOSIS — N400 Enlarged prostate without lower urinary tract symptoms: Secondary | ICD-10-CM | POA: Diagnosis not present

## 2016-12-11 DIAGNOSIS — R251 Tremor, unspecified: Secondary | ICD-10-CM | POA: Diagnosis not present

## 2016-12-11 DIAGNOSIS — M7989 Other specified soft tissue disorders: Secondary | ICD-10-CM | POA: Diagnosis not present

## 2016-12-12 DIAGNOSIS — G20 Parkinson's disease: Secondary | ICD-10-CM | POA: Diagnosis not present

## 2016-12-15 DIAGNOSIS — S8012XA Contusion of left lower leg, initial encounter: Secondary | ICD-10-CM | POA: Diagnosis not present

## 2016-12-22 DIAGNOSIS — S8012XA Contusion of left lower leg, initial encounter: Secondary | ICD-10-CM | POA: Diagnosis not present

## 2016-12-23 DIAGNOSIS — M25572 Pain in left ankle and joints of left foot: Secondary | ICD-10-CM | POA: Diagnosis not present

## 2016-12-23 DIAGNOSIS — R2689 Other abnormalities of gait and mobility: Secondary | ICD-10-CM | POA: Diagnosis not present

## 2016-12-23 DIAGNOSIS — M6281 Muscle weakness (generalized): Secondary | ICD-10-CM | POA: Diagnosis not present

## 2016-12-23 DIAGNOSIS — M79605 Pain in left leg: Secondary | ICD-10-CM | POA: Diagnosis not present

## 2016-12-26 DIAGNOSIS — M25572 Pain in left ankle and joints of left foot: Secondary | ICD-10-CM | POA: Diagnosis not present

## 2016-12-26 DIAGNOSIS — M6281 Muscle weakness (generalized): Secondary | ICD-10-CM | POA: Diagnosis not present

## 2016-12-26 DIAGNOSIS — M79605 Pain in left leg: Secondary | ICD-10-CM | POA: Diagnosis not present

## 2016-12-26 DIAGNOSIS — R2689 Other abnormalities of gait and mobility: Secondary | ICD-10-CM | POA: Diagnosis not present

## 2016-12-30 DIAGNOSIS — M6281 Muscle weakness (generalized): Secondary | ICD-10-CM | POA: Diagnosis not present

## 2016-12-30 DIAGNOSIS — M25572 Pain in left ankle and joints of left foot: Secondary | ICD-10-CM | POA: Diagnosis not present

## 2016-12-30 DIAGNOSIS — M79605 Pain in left leg: Secondary | ICD-10-CM | POA: Diagnosis not present

## 2016-12-30 DIAGNOSIS — R2689 Other abnormalities of gait and mobility: Secondary | ICD-10-CM | POA: Diagnosis not present

## 2017-01-01 DIAGNOSIS — M25572 Pain in left ankle and joints of left foot: Secondary | ICD-10-CM | POA: Diagnosis not present

## 2017-01-01 DIAGNOSIS — M6281 Muscle weakness (generalized): Secondary | ICD-10-CM | POA: Diagnosis not present

## 2017-01-01 DIAGNOSIS — M79605 Pain in left leg: Secondary | ICD-10-CM | POA: Diagnosis not present

## 2017-01-01 DIAGNOSIS — R2689 Other abnormalities of gait and mobility: Secondary | ICD-10-CM | POA: Diagnosis not present

## 2017-01-06 DIAGNOSIS — R2689 Other abnormalities of gait and mobility: Secondary | ICD-10-CM | POA: Diagnosis not present

## 2017-01-06 DIAGNOSIS — M79605 Pain in left leg: Secondary | ICD-10-CM | POA: Diagnosis not present

## 2017-01-06 DIAGNOSIS — M25572 Pain in left ankle and joints of left foot: Secondary | ICD-10-CM | POA: Diagnosis not present

## 2017-01-06 DIAGNOSIS — M6281 Muscle weakness (generalized): Secondary | ICD-10-CM | POA: Diagnosis not present

## 2017-01-12 DIAGNOSIS — G20 Parkinson's disease: Secondary | ICD-10-CM | POA: Diagnosis not present

## 2017-01-12 DIAGNOSIS — M545 Low back pain: Secondary | ICD-10-CM | POA: Diagnosis not present

## 2017-01-12 DIAGNOSIS — N400 Enlarged prostate without lower urinary tract symptoms: Secondary | ICD-10-CM | POA: Diagnosis not present

## 2017-01-12 DIAGNOSIS — E1140 Type 2 diabetes mellitus with diabetic neuropathy, unspecified: Secondary | ICD-10-CM | POA: Diagnosis not present

## 2017-01-12 DIAGNOSIS — E119 Type 2 diabetes mellitus without complications: Secondary | ICD-10-CM | POA: Diagnosis not present

## 2017-01-12 DIAGNOSIS — J208 Acute bronchitis due to other specified organisms: Secondary | ICD-10-CM | POA: Diagnosis not present

## 2017-01-12 DIAGNOSIS — I10 Essential (primary) hypertension: Secondary | ICD-10-CM | POA: Diagnosis not present

## 2017-01-12 DIAGNOSIS — E7800 Pure hypercholesterolemia, unspecified: Secondary | ICD-10-CM | POA: Diagnosis not present

## 2017-01-12 DIAGNOSIS — I2510 Atherosclerotic heart disease of native coronary artery without angina pectoris: Secondary | ICD-10-CM | POA: Diagnosis not present

## 2017-01-12 DIAGNOSIS — K219 Gastro-esophageal reflux disease without esophagitis: Secondary | ICD-10-CM | POA: Diagnosis not present

## 2017-01-12 DIAGNOSIS — M159 Polyosteoarthritis, unspecified: Secondary | ICD-10-CM | POA: Diagnosis not present

## 2017-01-12 DIAGNOSIS — I6789 Other cerebrovascular disease: Secondary | ICD-10-CM | POA: Diagnosis not present

## 2017-01-17 IMAGING — RF DG LUMBAR SPINE 2-3V
1 series · 2 of 2 positions shown · non-contrast
Comparison: None.

CLINICAL DATA: L4-5 lumbar posterior fusion

EXAM:
LUMBAR SPINE - 2-3 VIEW

[Series 1: run · 2 of 2 slices shown]
[im 1/2]
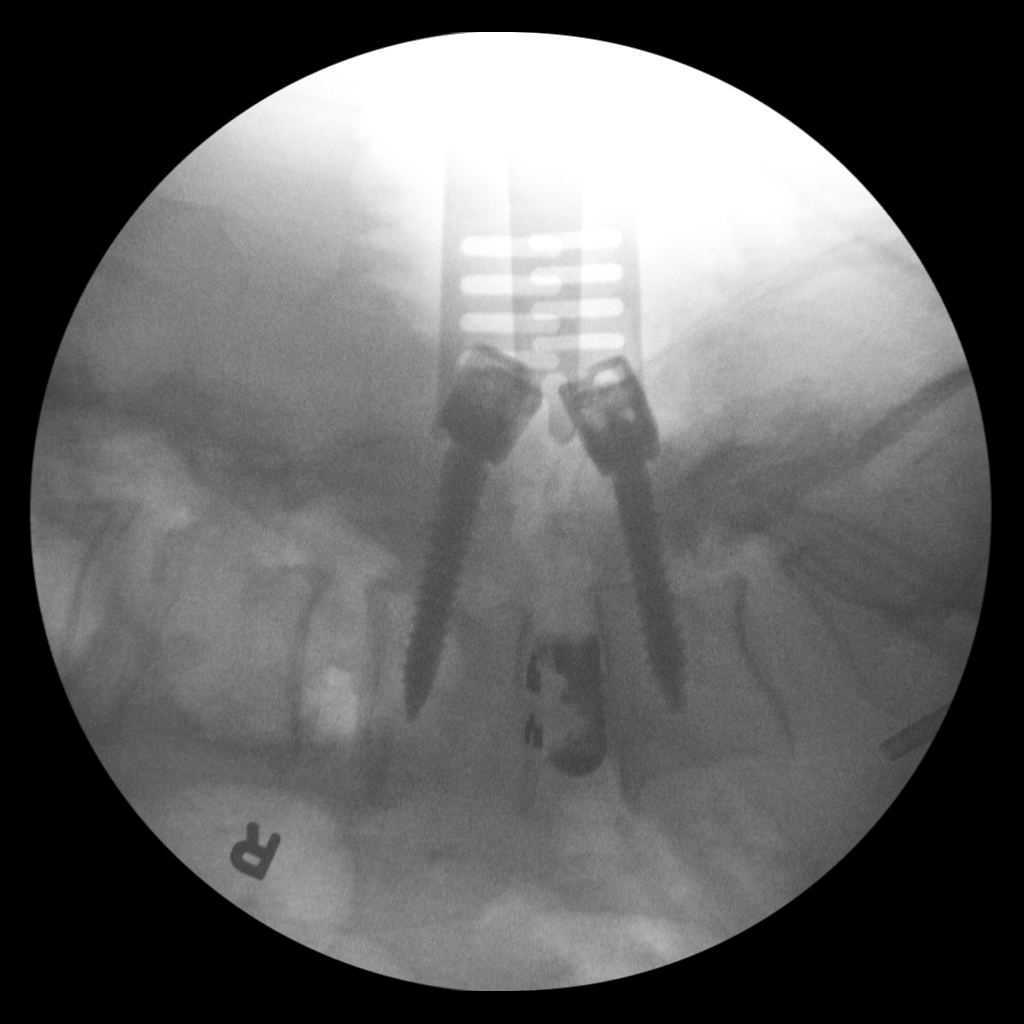
[im 2/2]
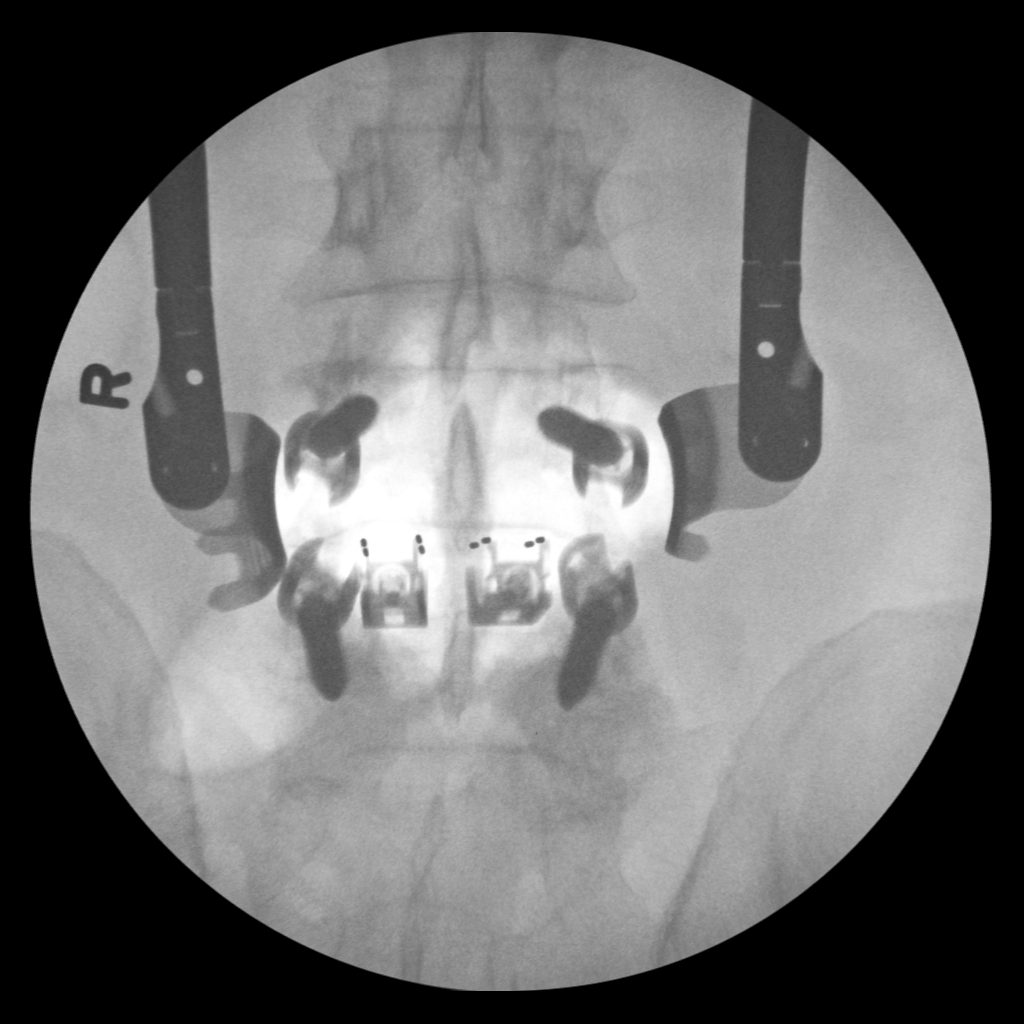

[2 of 2 positions shown; findings below may reference images not displayed]

FINDINGS: Two C-arm spot films show placement of transpedicular screws at L4
and L5 levels, with interbody fusion plug at L4-5. No complicating
features are seen.
IMPRESSION: Hardware placed for posterior fusion at L4-5.

## 2017-01-19 DIAGNOSIS — S8012XA Contusion of left lower leg, initial encounter: Secondary | ICD-10-CM | POA: Diagnosis not present

## 2017-01-26 DIAGNOSIS — J208 Acute bronchitis due to other specified organisms: Secondary | ICD-10-CM | POA: Diagnosis not present

## 2017-01-26 DIAGNOSIS — I2510 Atherosclerotic heart disease of native coronary artery without angina pectoris: Secondary | ICD-10-CM | POA: Diagnosis not present

## 2017-01-26 DIAGNOSIS — I10 Essential (primary) hypertension: Secondary | ICD-10-CM | POA: Diagnosis not present

## 2017-01-26 DIAGNOSIS — E7800 Pure hypercholesterolemia, unspecified: Secondary | ICD-10-CM | POA: Diagnosis not present

## 2017-01-26 DIAGNOSIS — I6789 Other cerebrovascular disease: Secondary | ICD-10-CM | POA: Diagnosis not present

## 2017-01-26 DIAGNOSIS — M159 Polyosteoarthritis, unspecified: Secondary | ICD-10-CM | POA: Diagnosis not present

## 2017-01-26 DIAGNOSIS — G20 Parkinson's disease: Secondary | ICD-10-CM | POA: Diagnosis not present

## 2017-01-26 DIAGNOSIS — K219 Gastro-esophageal reflux disease without esophagitis: Secondary | ICD-10-CM | POA: Diagnosis not present

## 2017-01-26 DIAGNOSIS — N400 Enlarged prostate without lower urinary tract symptoms: Secondary | ICD-10-CM | POA: Diagnosis not present

## 2017-01-26 DIAGNOSIS — M545 Low back pain: Secondary | ICD-10-CM | POA: Diagnosis not present

## 2017-01-26 DIAGNOSIS — E119 Type 2 diabetes mellitus without complications: Secondary | ICD-10-CM | POA: Diagnosis not present

## 2017-01-26 DIAGNOSIS — E1140 Type 2 diabetes mellitus with diabetic neuropathy, unspecified: Secondary | ICD-10-CM | POA: Diagnosis not present

## 2017-02-21 DIAGNOSIS — K219 Gastro-esophageal reflux disease without esophagitis: Secondary | ICD-10-CM | POA: Diagnosis not present

## 2017-02-21 DIAGNOSIS — R300 Dysuria: Secondary | ICD-10-CM | POA: Diagnosis not present

## 2017-02-21 DIAGNOSIS — M545 Low back pain: Secondary | ICD-10-CM | POA: Diagnosis not present

## 2017-02-21 DIAGNOSIS — I6789 Other cerebrovascular disease: Secondary | ICD-10-CM | POA: Diagnosis not present

## 2017-02-21 DIAGNOSIS — E1140 Type 2 diabetes mellitus with diabetic neuropathy, unspecified: Secondary | ICD-10-CM | POA: Diagnosis not present

## 2017-02-21 DIAGNOSIS — N400 Enlarged prostate without lower urinary tract symptoms: Secondary | ICD-10-CM | POA: Diagnosis not present

## 2017-02-21 DIAGNOSIS — G20 Parkinson's disease: Secondary | ICD-10-CM | POA: Diagnosis not present

## 2017-02-21 DIAGNOSIS — I10 Essential (primary) hypertension: Secondary | ICD-10-CM | POA: Diagnosis not present

## 2017-02-21 DIAGNOSIS — I2510 Atherosclerotic heart disease of native coronary artery without angina pectoris: Secondary | ICD-10-CM | POA: Diagnosis not present

## 2017-02-21 DIAGNOSIS — E119 Type 2 diabetes mellitus without complications: Secondary | ICD-10-CM | POA: Diagnosis not present

## 2017-02-21 DIAGNOSIS — E7800 Pure hypercholesterolemia, unspecified: Secondary | ICD-10-CM | POA: Diagnosis not present

## 2017-02-21 DIAGNOSIS — M159 Polyosteoarthritis, unspecified: Secondary | ICD-10-CM | POA: Diagnosis not present

## 2017-02-25 DIAGNOSIS — K219 Gastro-esophageal reflux disease without esophagitis: Secondary | ICD-10-CM | POA: Diagnosis not present

## 2017-02-25 DIAGNOSIS — M159 Polyosteoarthritis, unspecified: Secondary | ICD-10-CM | POA: Diagnosis not present

## 2017-02-25 DIAGNOSIS — Z9181 History of falling: Secondary | ICD-10-CM | POA: Diagnosis not present

## 2017-02-25 DIAGNOSIS — I10 Essential (primary) hypertension: Secondary | ICD-10-CM | POA: Diagnosis not present

## 2017-02-25 DIAGNOSIS — E1140 Type 2 diabetes mellitus with diabetic neuropathy, unspecified: Secondary | ICD-10-CM | POA: Diagnosis not present

## 2017-02-25 DIAGNOSIS — G20 Parkinson's disease: Secondary | ICD-10-CM | POA: Diagnosis not present

## 2017-02-25 DIAGNOSIS — E119 Type 2 diabetes mellitus without complications: Secondary | ICD-10-CM | POA: Diagnosis not present

## 2017-02-25 DIAGNOSIS — N400 Enlarged prostate without lower urinary tract symptoms: Secondary | ICD-10-CM | POA: Diagnosis not present

## 2017-02-25 DIAGNOSIS — I2510 Atherosclerotic heart disease of native coronary artery without angina pectoris: Secondary | ICD-10-CM | POA: Diagnosis not present

## 2017-02-25 DIAGNOSIS — I6789 Other cerebrovascular disease: Secondary | ICD-10-CM | POA: Diagnosis not present

## 2017-02-25 DIAGNOSIS — M545 Low back pain: Secondary | ICD-10-CM | POA: Diagnosis not present

## 2017-02-25 DIAGNOSIS — E7800 Pure hypercholesterolemia, unspecified: Secondary | ICD-10-CM | POA: Diagnosis not present

## 2017-03-04 DIAGNOSIS — G20 Parkinson's disease: Secondary | ICD-10-CM | POA: Diagnosis not present

## 2017-03-04 DIAGNOSIS — I6789 Other cerebrovascular disease: Secondary | ICD-10-CM | POA: Diagnosis not present

## 2017-03-04 DIAGNOSIS — E1140 Type 2 diabetes mellitus with diabetic neuropathy, unspecified: Secondary | ICD-10-CM | POA: Diagnosis not present

## 2017-03-04 DIAGNOSIS — M545 Low back pain: Secondary | ICD-10-CM | POA: Diagnosis not present

## 2017-03-04 DIAGNOSIS — N400 Enlarged prostate without lower urinary tract symptoms: Secondary | ICD-10-CM | POA: Diagnosis not present

## 2017-03-04 DIAGNOSIS — E7800 Pure hypercholesterolemia, unspecified: Secondary | ICD-10-CM | POA: Diagnosis not present

## 2017-03-04 DIAGNOSIS — E119 Type 2 diabetes mellitus without complications: Secondary | ICD-10-CM | POA: Diagnosis not present

## 2017-03-04 DIAGNOSIS — Z139 Encounter for screening, unspecified: Secondary | ICD-10-CM | POA: Diagnosis not present

## 2017-03-04 DIAGNOSIS — M159 Polyosteoarthritis, unspecified: Secondary | ICD-10-CM | POA: Diagnosis not present

## 2017-03-04 DIAGNOSIS — I2510 Atherosclerotic heart disease of native coronary artery without angina pectoris: Secondary | ICD-10-CM | POA: Diagnosis not present

## 2017-03-04 DIAGNOSIS — K219 Gastro-esophageal reflux disease without esophagitis: Secondary | ICD-10-CM | POA: Diagnosis not present

## 2017-03-04 DIAGNOSIS — I10 Essential (primary) hypertension: Secondary | ICD-10-CM | POA: Diagnosis not present

## 2017-03-26 DIAGNOSIS — E7800 Pure hypercholesterolemia, unspecified: Secondary | ICD-10-CM | POA: Diagnosis not present

## 2017-03-26 DIAGNOSIS — I10 Essential (primary) hypertension: Secondary | ICD-10-CM | POA: Diagnosis not present

## 2017-03-26 DIAGNOSIS — I6789 Other cerebrovascular disease: Secondary | ICD-10-CM | POA: Diagnosis not present

## 2017-03-26 DIAGNOSIS — E1140 Type 2 diabetes mellitus with diabetic neuropathy, unspecified: Secondary | ICD-10-CM | POA: Diagnosis not present

## 2017-03-26 DIAGNOSIS — M545 Low back pain: Secondary | ICD-10-CM | POA: Diagnosis not present

## 2017-03-26 DIAGNOSIS — N400 Enlarged prostate without lower urinary tract symptoms: Secondary | ICD-10-CM | POA: Diagnosis not present

## 2017-03-26 DIAGNOSIS — K219 Gastro-esophageal reflux disease without esophagitis: Secondary | ICD-10-CM | POA: Diagnosis not present

## 2017-03-26 DIAGNOSIS — E119 Type 2 diabetes mellitus without complications: Secondary | ICD-10-CM | POA: Diagnosis not present

## 2017-03-26 DIAGNOSIS — R197 Diarrhea, unspecified: Secondary | ICD-10-CM | POA: Diagnosis not present

## 2017-03-26 DIAGNOSIS — G20 Parkinson's disease: Secondary | ICD-10-CM | POA: Diagnosis not present

## 2017-03-26 DIAGNOSIS — I2510 Atherosclerotic heart disease of native coronary artery without angina pectoris: Secondary | ICD-10-CM | POA: Diagnosis not present

## 2017-03-26 DIAGNOSIS — M159 Polyosteoarthritis, unspecified: Secondary | ICD-10-CM | POA: Diagnosis not present

## 2017-03-27 ENCOUNTER — Encounter (INDEPENDENT_AMBULATORY_CARE_PROVIDER_SITE_OTHER): Payer: Self-pay

## 2017-03-27 ENCOUNTER — Ambulatory Visit (INDEPENDENT_AMBULATORY_CARE_PROVIDER_SITE_OTHER): Payer: Managed Care, Other (non HMO) | Admitting: Sports Medicine

## 2017-03-27 ENCOUNTER — Encounter: Payer: Self-pay | Admitting: Sports Medicine

## 2017-03-27 VITALS — BP 128/66 | HR 91

## 2017-03-27 DIAGNOSIS — M2042 Other hammer toe(s) (acquired), left foot: Secondary | ICD-10-CM

## 2017-03-27 DIAGNOSIS — M2041 Other hammer toe(s) (acquired), right foot: Secondary | ICD-10-CM

## 2017-03-27 DIAGNOSIS — M21961 Unspecified acquired deformity of right lower leg: Secondary | ICD-10-CM | POA: Diagnosis not present

## 2017-03-27 DIAGNOSIS — M21962 Unspecified acquired deformity of left lower leg: Secondary | ICD-10-CM | POA: Diagnosis not present

## 2017-03-27 DIAGNOSIS — M2011 Hallux valgus (acquired), right foot: Secondary | ICD-10-CM | POA: Diagnosis not present

## 2017-03-27 DIAGNOSIS — E119 Type 2 diabetes mellitus without complications: Secondary | ICD-10-CM

## 2017-03-27 DIAGNOSIS — M216X9 Other acquired deformities of unspecified foot: Secondary | ICD-10-CM

## 2017-03-27 DIAGNOSIS — M2012 Hallux valgus (acquired), left foot: Secondary | ICD-10-CM | POA: Diagnosis not present
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
## 2017-03-27 NOTE — Patient Instructions (Signed)

## 2017-05-07 DIAGNOSIS — L219 Seborrheic dermatitis, unspecified: Secondary | ICD-10-CM | POA: Diagnosis not present

## 2017-05-07 DIAGNOSIS — M545 Low back pain: Secondary | ICD-10-CM | POA: Diagnosis not present

## 2017-05-07 DIAGNOSIS — Z6829 Body mass index (BMI) 29.0-29.9, adult: Secondary | ICD-10-CM | POA: Diagnosis not present

## 2017-05-07 DIAGNOSIS — I2510 Atherosclerotic heart disease of native coronary artery without angina pectoris: Secondary | ICD-10-CM | POA: Diagnosis not present

## 2017-05-07 DIAGNOSIS — I6789 Other cerebrovascular disease: Secondary | ICD-10-CM | POA: Diagnosis not present

## 2017-05-07 DIAGNOSIS — E119 Type 2 diabetes mellitus without complications: Secondary | ICD-10-CM | POA: Diagnosis not present

## 2017-05-07 DIAGNOSIS — L821 Other seborrheic keratosis: Secondary | ICD-10-CM | POA: Diagnosis not present

## 2017-05-07 DIAGNOSIS — C4441 Basal cell carcinoma of skin of scalp and neck: Secondary | ICD-10-CM | POA: Diagnosis not present

## 2017-05-07 DIAGNOSIS — M159 Polyosteoarthritis, unspecified: Secondary | ICD-10-CM | POA: Diagnosis not present

## 2017-05-07 DIAGNOSIS — E7800 Pure hypercholesterolemia, unspecified: Secondary | ICD-10-CM | POA: Diagnosis not present

## 2017-05-07 DIAGNOSIS — K219 Gastro-esophageal reflux disease without esophagitis: Secondary | ICD-10-CM | POA: Diagnosis not present

## 2017-05-07 DIAGNOSIS — R600 Localized edema: Secondary | ICD-10-CM | POA: Diagnosis not present

## 2017-05-07 DIAGNOSIS — E1140 Type 2 diabetes mellitus with diabetic neuropathy, unspecified: Secondary | ICD-10-CM | POA: Diagnosis not present

## 2017-05-12 DIAGNOSIS — E119 Type 2 diabetes mellitus without complications: Secondary | ICD-10-CM | POA: Diagnosis not present

## 2017-05-12 DIAGNOSIS — H2513 Age-related nuclear cataract, bilateral: Secondary | ICD-10-CM | POA: Diagnosis not present

## 2017-05-12 DIAGNOSIS — H53001 Unspecified amblyopia, right eye: Secondary | ICD-10-CM | POA: Diagnosis not present

## 2017-05-13 DIAGNOSIS — M545 Low back pain: Secondary | ICD-10-CM | POA: Diagnosis not present

## 2017-05-13 DIAGNOSIS — I10 Essential (primary) hypertension: Secondary | ICD-10-CM | POA: Diagnosis not present

## 2017-05-13 DIAGNOSIS — N400 Enlarged prostate without lower urinary tract symptoms: Secondary | ICD-10-CM | POA: Diagnosis not present

## 2017-05-13 DIAGNOSIS — E1140 Type 2 diabetes mellitus with diabetic neuropathy, unspecified: Secondary | ICD-10-CM | POA: Diagnosis not present

## 2017-05-13 DIAGNOSIS — E119 Type 2 diabetes mellitus without complications: Secondary | ICD-10-CM | POA: Diagnosis not present

## 2017-05-13 DIAGNOSIS — I2510 Atherosclerotic heart disease of native coronary artery without angina pectoris: Secondary | ICD-10-CM | POA: Diagnosis not present

## 2017-05-13 DIAGNOSIS — E7800 Pure hypercholesterolemia, unspecified: Secondary | ICD-10-CM | POA: Diagnosis not present

## 2017-05-13 DIAGNOSIS — Z23 Encounter for immunization: Secondary | ICD-10-CM | POA: Diagnosis not present

## 2017-05-13 DIAGNOSIS — R600 Localized edema: Secondary | ICD-10-CM | POA: Diagnosis not present

## 2017-05-13 DIAGNOSIS — M159 Polyosteoarthritis, unspecified: Secondary | ICD-10-CM | POA: Diagnosis not present

## 2017-05-13 DIAGNOSIS — I6789 Other cerebrovascular disease: Secondary | ICD-10-CM | POA: Diagnosis not present

## 2017-06-08 DIAGNOSIS — G20 Parkinson's disease: Secondary | ICD-10-CM | POA: Diagnosis not present

## 2017-06-10 DIAGNOSIS — R600 Localized edema: Secondary | ICD-10-CM | POA: Diagnosis not present

## 2017-06-10 DIAGNOSIS — E7800 Pure hypercholesterolemia, unspecified: Secondary | ICD-10-CM | POA: Diagnosis not present

## 2017-06-10 DIAGNOSIS — N400 Enlarged prostate without lower urinary tract symptoms: Secondary | ICD-10-CM | POA: Diagnosis not present

## 2017-06-10 DIAGNOSIS — I2510 Atherosclerotic heart disease of native coronary artery without angina pectoris: Secondary | ICD-10-CM | POA: Diagnosis not present

## 2017-06-10 DIAGNOSIS — G20 Parkinson's disease: Secondary | ICD-10-CM | POA: Diagnosis not present

## 2017-06-10 DIAGNOSIS — M545 Low back pain: Secondary | ICD-10-CM | POA: Diagnosis not present

## 2017-06-10 DIAGNOSIS — E119 Type 2 diabetes mellitus without complications: Secondary | ICD-10-CM | POA: Diagnosis not present

## 2017-06-10 DIAGNOSIS — M159 Polyosteoarthritis, unspecified: Secondary | ICD-10-CM | POA: Diagnosis not present

## 2017-06-10 DIAGNOSIS — K148 Other diseases of tongue: Secondary | ICD-10-CM | POA: Diagnosis not present

## 2017-06-10 DIAGNOSIS — I10 Essential (primary) hypertension: Secondary | ICD-10-CM | POA: Diagnosis not present

## 2017-06-10 DIAGNOSIS — E1140 Type 2 diabetes mellitus with diabetic neuropathy, unspecified: Secondary | ICD-10-CM | POA: Diagnosis not present

## 2017-06-10 DIAGNOSIS — Z1331 Encounter for screening for depression: Secondary | ICD-10-CM | POA: Diagnosis not present

## 2017-08-10 DIAGNOSIS — I2510 Atherosclerotic heart disease of native coronary artery without angina pectoris: Secondary | ICD-10-CM | POA: Diagnosis not present

## 2017-08-10 DIAGNOSIS — M545 Low back pain: Secondary | ICD-10-CM | POA: Diagnosis not present

## 2017-08-10 DIAGNOSIS — I6789 Other cerebrovascular disease: Secondary | ICD-10-CM | POA: Diagnosis not present

## 2017-08-10 DIAGNOSIS — K219 Gastro-esophageal reflux disease without esophagitis: Secondary | ICD-10-CM | POA: Diagnosis not present

## 2017-08-10 DIAGNOSIS — E1140 Type 2 diabetes mellitus with diabetic neuropathy, unspecified: Secondary | ICD-10-CM | POA: Diagnosis not present

## 2017-08-10 DIAGNOSIS — I10 Essential (primary) hypertension: Secondary | ICD-10-CM | POA: Diagnosis not present

## 2017-08-10 DIAGNOSIS — M159 Polyosteoarthritis, unspecified: Secondary | ICD-10-CM | POA: Diagnosis not present

## 2017-08-10 DIAGNOSIS — G20 Parkinson's disease: Secondary | ICD-10-CM | POA: Diagnosis not present

## 2017-08-10 DIAGNOSIS — N400 Enlarged prostate without lower urinary tract symptoms: Secondary | ICD-10-CM | POA: Diagnosis not present

## 2017-08-10 DIAGNOSIS — R600 Localized edema: Secondary | ICD-10-CM | POA: Diagnosis not present

## 2017-08-10 DIAGNOSIS — E7800 Pure hypercholesterolemia, unspecified: Secondary | ICD-10-CM | POA: Diagnosis not present

## 2017-08-10 DIAGNOSIS — E119 Type 2 diabetes mellitus without complications: Secondary | ICD-10-CM | POA: Diagnosis not present

## 2017-08-19 DIAGNOSIS — N400 Enlarged prostate without lower urinary tract symptoms: Secondary | ICD-10-CM | POA: Diagnosis not present

## 2017-08-19 DIAGNOSIS — M545 Low back pain: Secondary | ICD-10-CM | POA: Diagnosis not present

## 2017-08-19 DIAGNOSIS — N289 Disorder of kidney and ureter, unspecified: Secondary | ICD-10-CM | POA: Diagnosis not present

## 2017-08-19 DIAGNOSIS — E1140 Type 2 diabetes mellitus with diabetic neuropathy, unspecified: Secondary | ICD-10-CM | POA: Diagnosis not present

## 2017-08-19 DIAGNOSIS — I6789 Other cerebrovascular disease: Secondary | ICD-10-CM | POA: Diagnosis not present

## 2017-08-19 DIAGNOSIS — R600 Localized edema: Secondary | ICD-10-CM | POA: Diagnosis not present

## 2017-08-19 DIAGNOSIS — I2510 Atherosclerotic heart disease of native coronary artery without angina pectoris: Secondary | ICD-10-CM | POA: Diagnosis not present

## 2017-08-19 DIAGNOSIS — M159 Polyosteoarthritis, unspecified: Secondary | ICD-10-CM | POA: Diagnosis not present

## 2017-08-19 DIAGNOSIS — G20 Parkinson's disease: Secondary | ICD-10-CM | POA: Diagnosis not present

## 2017-08-19 DIAGNOSIS — E7800 Pure hypercholesterolemia, unspecified: Secondary | ICD-10-CM | POA: Diagnosis not present

## 2017-08-19 DIAGNOSIS — E119 Type 2 diabetes mellitus without complications: Secondary | ICD-10-CM | POA: Diagnosis not present

## 2017-08-19 DIAGNOSIS — K219 Gastro-esophageal reflux disease without esophagitis: Secondary | ICD-10-CM | POA: Diagnosis not present

## 2017-09-21 DIAGNOSIS — E7800 Pure hypercholesterolemia, unspecified: Secondary | ICD-10-CM | POA: Diagnosis not present

## 2017-09-21 DIAGNOSIS — N289 Disorder of kidney and ureter, unspecified: Secondary | ICD-10-CM | POA: Diagnosis not present

## 2017-09-21 DIAGNOSIS — I10 Essential (primary) hypertension: Secondary | ICD-10-CM | POA: Diagnosis not present

## 2017-09-21 DIAGNOSIS — Z6828 Body mass index (BMI) 28.0-28.9, adult: Secondary | ICD-10-CM | POA: Diagnosis not present

## 2017-09-21 DIAGNOSIS — E119 Type 2 diabetes mellitus without complications: Secondary | ICD-10-CM | POA: Diagnosis not present

## 2017-09-21 DIAGNOSIS — N400 Enlarged prostate without lower urinary tract symptoms: Secondary | ICD-10-CM | POA: Diagnosis not present

## 2017-09-21 DIAGNOSIS — E1140 Type 2 diabetes mellitus with diabetic neuropathy, unspecified: Secondary | ICD-10-CM | POA: Diagnosis not present

## 2017-09-21 DIAGNOSIS — G20 Parkinson's disease: Secondary | ICD-10-CM | POA: Diagnosis not present

## 2017-10-19 DIAGNOSIS — G20 Parkinson's disease: Secondary | ICD-10-CM | POA: Diagnosis not present

## 2017-10-19 DIAGNOSIS — N289 Disorder of kidney and ureter, unspecified: Secondary | ICD-10-CM | POA: Diagnosis not present

## 2017-10-19 DIAGNOSIS — I10 Essential (primary) hypertension: Secondary | ICD-10-CM | POA: Diagnosis not present

## 2017-10-19 DIAGNOSIS — E7800 Pure hypercholesterolemia, unspecified: Secondary | ICD-10-CM | POA: Diagnosis not present

## 2017-10-19 DIAGNOSIS — N400 Enlarged prostate without lower urinary tract symptoms: Secondary | ICD-10-CM | POA: Diagnosis not present

## 2017-10-19 DIAGNOSIS — E119 Type 2 diabetes mellitus without complications: Secondary | ICD-10-CM | POA: Diagnosis not present

## 2017-10-19 DIAGNOSIS — E1140 Type 2 diabetes mellitus with diabetic neuropathy, unspecified: Secondary | ICD-10-CM | POA: Diagnosis not present

## 2017-10-19 DIAGNOSIS — Z6829 Body mass index (BMI) 29.0-29.9, adult: Secondary | ICD-10-CM | POA: Diagnosis not present

## 2017-11-17 DIAGNOSIS — H2512 Age-related nuclear cataract, left eye: Secondary | ICD-10-CM | POA: Diagnosis not present

## 2017-11-17 DIAGNOSIS — H25811 Combined forms of age-related cataract, right eye: Secondary | ICD-10-CM | POA: Diagnosis not present

## 2017-12-08 DIAGNOSIS — I10 Essential (primary) hypertension: Secondary | ICD-10-CM | POA: Diagnosis not present

## 2017-12-08 DIAGNOSIS — E119 Type 2 diabetes mellitus without complications: Secondary | ICD-10-CM | POA: Diagnosis not present

## 2017-12-08 DIAGNOSIS — G20 Parkinson's disease: Secondary | ICD-10-CM | POA: Diagnosis not present

## 2017-12-08 DIAGNOSIS — E1140 Type 2 diabetes mellitus with diabetic neuropathy, unspecified: Secondary | ICD-10-CM | POA: Diagnosis not present

## 2017-12-08 DIAGNOSIS — E7800 Pure hypercholesterolemia, unspecified: Secondary | ICD-10-CM | POA: Diagnosis not present

## 2017-12-15 DIAGNOSIS — E1140 Type 2 diabetes mellitus with diabetic neuropathy, unspecified: Secondary | ICD-10-CM | POA: Diagnosis not present

## 2017-12-15 DIAGNOSIS — M25562 Pain in left knee: Secondary | ICD-10-CM | POA: Diagnosis not present

## 2017-12-15 DIAGNOSIS — G20 Parkinson's disease: Secondary | ICD-10-CM | POA: Diagnosis not present

## 2017-12-15 DIAGNOSIS — N400 Enlarged prostate without lower urinary tract symptoms: Secondary | ICD-10-CM | POA: Diagnosis not present

## 2017-12-18 DIAGNOSIS — Z1331 Encounter for screening for depression: Secondary | ICD-10-CM | POA: Diagnosis not present

## 2017-12-18 DIAGNOSIS — Z125 Encounter for screening for malignant neoplasm of prostate: Secondary | ICD-10-CM | POA: Diagnosis not present

## 2017-12-18 DIAGNOSIS — Z9181 History of falling: Secondary | ICD-10-CM | POA: Diagnosis not present

## 2017-12-18 DIAGNOSIS — Z0000 Encounter for general adult medical examination without abnormal findings: Secondary | ICD-10-CM | POA: Diagnosis not present

## 2017-12-18 DIAGNOSIS — Z1389 Encounter for screening for other disorder: Secondary | ICD-10-CM | POA: Diagnosis not present

## 2017-12-18 DIAGNOSIS — E785 Hyperlipidemia, unspecified: Secondary | ICD-10-CM | POA: Diagnosis not present

## 2018-01-08 DIAGNOSIS — H2511 Age-related nuclear cataract, right eye: Secondary | ICD-10-CM | POA: Diagnosis not present

## 2018-01-29 DIAGNOSIS — Z01818 Encounter for other preprocedural examination: Secondary | ICD-10-CM | POA: Diagnosis not present

## 2018-01-29 DIAGNOSIS — H25811 Combined forms of age-related cataract, right eye: Secondary | ICD-10-CM | POA: Diagnosis not present

## 2018-03-02 DIAGNOSIS — Z8673 Personal history of transient ischemic attack (TIA), and cerebral infarction without residual deficits: Secondary | ICD-10-CM | POA: Diagnosis not present

## 2018-03-02 DIAGNOSIS — I2510 Atherosclerotic heart disease of native coronary artery without angina pectoris: Secondary | ICD-10-CM | POA: Diagnosis not present

## 2018-03-02 DIAGNOSIS — K219 Gastro-esophageal reflux disease without esophagitis: Secondary | ICD-10-CM | POA: Diagnosis not present

## 2018-03-02 DIAGNOSIS — H52223 Regular astigmatism, bilateral: Secondary | ICD-10-CM | POA: Diagnosis not present

## 2018-03-02 DIAGNOSIS — E7800 Pure hypercholesterolemia, unspecified: Secondary | ICD-10-CM | POA: Diagnosis not present

## 2018-03-02 DIAGNOSIS — H2511 Age-related nuclear cataract, right eye: Secondary | ICD-10-CM | POA: Diagnosis not present

## 2018-03-02 DIAGNOSIS — E1140 Type 2 diabetes mellitus with diabetic neuropathy, unspecified: Secondary | ICD-10-CM | POA: Diagnosis not present

## 2018-03-02 DIAGNOSIS — E1136 Type 2 diabetes mellitus with diabetic cataract: Secondary | ICD-10-CM | POA: Diagnosis not present

## 2018-03-02 DIAGNOSIS — F17220 Nicotine dependence, chewing tobacco, uncomplicated: Secondary | ICD-10-CM | POA: Diagnosis not present

## 2018-03-02 DIAGNOSIS — H5703 Miosis: Secondary | ICD-10-CM | POA: Diagnosis not present

## 2018-03-02 DIAGNOSIS — I10 Essential (primary) hypertension: Secondary | ICD-10-CM | POA: Diagnosis not present

## 2018-03-02 DIAGNOSIS — H5021 Vertical strabismus, right eye: Secondary | ICD-10-CM | POA: Diagnosis not present

## 2018-03-02 DIAGNOSIS — N400 Enlarged prostate without lower urinary tract symptoms: Secondary | ICD-10-CM | POA: Diagnosis not present

## 2018-03-02 DIAGNOSIS — I679 Cerebrovascular disease, unspecified: Secondary | ICD-10-CM | POA: Diagnosis not present

## 2018-03-02 DIAGNOSIS — M1990 Unspecified osteoarthritis, unspecified site: Secondary | ICD-10-CM | POA: Diagnosis not present

## 2018-03-02 DIAGNOSIS — H53001 Unspecified amblyopia, right eye: Secondary | ICD-10-CM | POA: Diagnosis not present

## 2018-03-02 DIAGNOSIS — Z7984 Long term (current) use of oral hypoglycemic drugs: Secondary | ICD-10-CM | POA: Diagnosis not present

## 2018-03-02 DIAGNOSIS — H259 Unspecified age-related cataract: Secondary | ICD-10-CM | POA: Diagnosis not present

## 2018-03-02 DIAGNOSIS — Z7982 Long term (current) use of aspirin: Secondary | ICD-10-CM | POA: Diagnosis not present

## 2018-03-02 DIAGNOSIS — Z79899 Other long term (current) drug therapy: Secondary | ICD-10-CM | POA: Diagnosis not present

## 2018-03-02 DIAGNOSIS — G20 Parkinson's disease: Secondary | ICD-10-CM | POA: Diagnosis not present

## 2018-03-11 DIAGNOSIS — G20 Parkinson's disease: Secondary | ICD-10-CM | POA: Diagnosis not present

## 2018-03-25 DIAGNOSIS — M7542 Impingement syndrome of left shoulder: Secondary | ICD-10-CM | POA: Diagnosis not present

## 2018-03-25 DIAGNOSIS — M47812 Spondylosis without myelopathy or radiculopathy, cervical region: Secondary | ICD-10-CM | POA: Diagnosis not present

## 2018-03-31 DIAGNOSIS — N132 Hydronephrosis with renal and ureteral calculous obstruction: Secondary | ICD-10-CM | POA: Diagnosis not present

## 2018-03-31 DIAGNOSIS — Z87891 Personal history of nicotine dependence: Secondary | ICD-10-CM | POA: Diagnosis not present

## 2018-03-31 DIAGNOSIS — Z87442 Personal history of urinary calculi: Secondary | ICD-10-CM | POA: Diagnosis not present

## 2018-03-31 DIAGNOSIS — N201 Calculus of ureter: Secondary | ICD-10-CM | POA: Diagnosis not present

## 2018-03-31 DIAGNOSIS — N200 Calculus of kidney: Secondary | ICD-10-CM | POA: Diagnosis not present

## 2018-04-08 DIAGNOSIS — N390 Urinary tract infection, site not specified: Secondary | ICD-10-CM | POA: Diagnosis not present

## 2018-04-08 DIAGNOSIS — N401 Enlarged prostate with lower urinary tract symptoms: Secondary | ICD-10-CM | POA: Diagnosis not present

## 2018-04-08 DIAGNOSIS — N200 Calculus of kidney: Secondary | ICD-10-CM | POA: Diagnosis not present

## 2018-04-08 DIAGNOSIS — Z79899 Other long term (current) drug therapy: Secondary | ICD-10-CM | POA: Diagnosis not present

## 2018-04-09 DIAGNOSIS — N201 Calculus of ureter: Secondary | ICD-10-CM | POA: Diagnosis not present

## 2018-04-09 DIAGNOSIS — N200 Calculus of kidney: Secondary | ICD-10-CM | POA: Diagnosis not present

## 2018-04-12 DIAGNOSIS — N400 Enlarged prostate without lower urinary tract symptoms: Secondary | ICD-10-CM | POA: Diagnosis not present

## 2018-04-12 DIAGNOSIS — Z1339 Encounter for screening examination for other mental health and behavioral disorders: Secondary | ICD-10-CM | POA: Diagnosis not present

## 2018-04-12 DIAGNOSIS — G20 Parkinson's disease: Secondary | ICD-10-CM | POA: Diagnosis not present

## 2018-04-12 DIAGNOSIS — E1140 Type 2 diabetes mellitus with diabetic neuropathy, unspecified: Secondary | ICD-10-CM | POA: Diagnosis not present

## 2018-04-12 DIAGNOSIS — M159 Polyosteoarthritis, unspecified: Secondary | ICD-10-CM | POA: Diagnosis not present

## 2018-04-12 DIAGNOSIS — E119 Type 2 diabetes mellitus without complications: Secondary | ICD-10-CM | POA: Diagnosis not present

## 2018-04-13 DIAGNOSIS — K219 Gastro-esophageal reflux disease without esophagitis: Secondary | ICD-10-CM | POA: Diagnosis not present

## 2018-04-13 DIAGNOSIS — E1136 Type 2 diabetes mellitus with diabetic cataract: Secondary | ICD-10-CM | POA: Diagnosis not present

## 2018-04-13 DIAGNOSIS — Z7984 Long term (current) use of oral hypoglycemic drugs: Secondary | ICD-10-CM | POA: Diagnosis not present

## 2018-04-13 DIAGNOSIS — E785 Hyperlipidemia, unspecified: Secondary | ICD-10-CM | POA: Diagnosis not present

## 2018-04-13 DIAGNOSIS — Z8673 Personal history of transient ischemic attack (TIA), and cerebral infarction without residual deficits: Secondary | ICD-10-CM | POA: Diagnosis not present

## 2018-04-13 DIAGNOSIS — N400 Enlarged prostate without lower urinary tract symptoms: Secondary | ICD-10-CM | POA: Diagnosis not present

## 2018-04-13 DIAGNOSIS — Z87891 Personal history of nicotine dependence: Secondary | ICD-10-CM | POA: Diagnosis not present

## 2018-04-13 DIAGNOSIS — Z79899 Other long term (current) drug therapy: Secondary | ICD-10-CM | POA: Diagnosis not present

## 2018-04-13 DIAGNOSIS — M1990 Unspecified osteoarthritis, unspecified site: Secondary | ICD-10-CM | POA: Diagnosis not present

## 2018-04-13 DIAGNOSIS — I10 Essential (primary) hypertension: Secondary | ICD-10-CM | POA: Diagnosis not present

## 2018-04-13 DIAGNOSIS — Z7982 Long term (current) use of aspirin: Secondary | ICD-10-CM | POA: Diagnosis not present

## 2018-04-13 DIAGNOSIS — I2510 Atherosclerotic heart disease of native coronary artery without angina pectoris: Secondary | ICD-10-CM | POA: Diagnosis not present

## 2018-04-13 DIAGNOSIS — G20 Parkinson's disease: Secondary | ICD-10-CM | POA: Diagnosis not present

## 2018-04-13 DIAGNOSIS — E1140 Type 2 diabetes mellitus with diabetic neuropathy, unspecified: Secondary | ICD-10-CM | POA: Diagnosis not present

## 2018-04-13 DIAGNOSIS — H2512 Age-related nuclear cataract, left eye: Secondary | ICD-10-CM | POA: Diagnosis not present

## 2018-04-21 DIAGNOSIS — N209 Urinary calculus, unspecified: Secondary | ICD-10-CM | POA: Diagnosis not present

## 2018-05-11 ENCOUNTER — Encounter: Payer: Self-pay | Admitting: Gastroenterology

## 2018-05-11 DIAGNOSIS — H524 Presbyopia: Secondary | ICD-10-CM | POA: Diagnosis not present

## 2018-08-12 DIAGNOSIS — M159 Polyosteoarthritis, unspecified: Secondary | ICD-10-CM | POA: Diagnosis not present

## 2018-08-12 DIAGNOSIS — I10 Essential (primary) hypertension: Secondary | ICD-10-CM | POA: Diagnosis not present

## 2018-08-12 DIAGNOSIS — E119 Type 2 diabetes mellitus without complications: Secondary | ICD-10-CM | POA: Diagnosis not present

## 2018-08-12 DIAGNOSIS — E7800 Pure hypercholesterolemia, unspecified: Secondary | ICD-10-CM | POA: Diagnosis not present

## 2018-08-12 DIAGNOSIS — G20 Parkinson's disease: Secondary | ICD-10-CM | POA: Diagnosis not present

## 2018-08-12 DIAGNOSIS — E1140 Type 2 diabetes mellitus with diabetic neuropathy, unspecified: Secondary | ICD-10-CM | POA: Diagnosis not present

## 2018-09-16 DIAGNOSIS — I10 Essential (primary) hypertension: Secondary | ICD-10-CM | POA: Diagnosis not present

## 2018-09-16 DIAGNOSIS — G20 Parkinson's disease: Secondary | ICD-10-CM | POA: Diagnosis not present

## 2018-09-16 DIAGNOSIS — E1140 Type 2 diabetes mellitus with diabetic neuropathy, unspecified: Secondary | ICD-10-CM | POA: Diagnosis not present

## 2018-09-16 DIAGNOSIS — M159 Polyosteoarthritis, unspecified: Secondary | ICD-10-CM | POA: Diagnosis not present

## 2018-09-21 DIAGNOSIS — G20 Parkinson's disease: Secondary | ICD-10-CM | POA: Diagnosis not present

## 2018-09-30 DIAGNOSIS — I10 Essential (primary) hypertension: Secondary | ICD-10-CM | POA: Diagnosis not present

## 2018-09-30 DIAGNOSIS — M159 Polyosteoarthritis, unspecified: Secondary | ICD-10-CM | POA: Diagnosis not present

## 2018-09-30 DIAGNOSIS — G20 Parkinson's disease: Secondary | ICD-10-CM | POA: Diagnosis not present

## 2018-09-30 DIAGNOSIS — N400 Enlarged prostate without lower urinary tract symptoms: Secondary | ICD-10-CM | POA: Diagnosis not present

## 2018-10-15 DIAGNOSIS — M7062 Trochanteric bursitis, left hip: Secondary | ICD-10-CM | POA: Diagnosis not present

## 2018-10-15 DIAGNOSIS — M545 Low back pain: Secondary | ICD-10-CM | POA: Diagnosis not present

## 2018-10-29 DIAGNOSIS — M7062 Trochanteric bursitis, left hip: Secondary | ICD-10-CM | POA: Diagnosis not present

## 2018-10-29 DIAGNOSIS — M545 Low back pain: Secondary | ICD-10-CM | POA: Diagnosis not present

## 2018-11-25 DIAGNOSIS — G20 Parkinson's disease: Secondary | ICD-10-CM | POA: Diagnosis not present

## 2018-11-25 DIAGNOSIS — M159 Polyosteoarthritis, unspecified: Secondary | ICD-10-CM | POA: Diagnosis not present

## 2018-11-25 DIAGNOSIS — H1013 Acute atopic conjunctivitis, bilateral: Secondary | ICD-10-CM | POA: Diagnosis not present

## 2018-11-25 DIAGNOSIS — I10 Essential (primary) hypertension: Secondary | ICD-10-CM | POA: Diagnosis not present

## 2018-12-03 DIAGNOSIS — G20 Parkinson's disease: Secondary | ICD-10-CM | POA: Diagnosis not present

## 2018-12-03 DIAGNOSIS — H1013 Acute atopic conjunctivitis, bilateral: Secondary | ICD-10-CM | POA: Diagnosis not present

## 2018-12-03 DIAGNOSIS — I10 Essential (primary) hypertension: Secondary | ICD-10-CM | POA: Diagnosis not present

## 2018-12-03 DIAGNOSIS — M159 Polyosteoarthritis, unspecified: Secondary | ICD-10-CM | POA: Diagnosis not present

## 2018-12-13 DIAGNOSIS — G20 Parkinson's disease: Secondary | ICD-10-CM | POA: Diagnosis not present

## 2018-12-13 DIAGNOSIS — E119 Type 2 diabetes mellitus without complications: Secondary | ICD-10-CM | POA: Diagnosis not present

## 2018-12-13 DIAGNOSIS — E7800 Pure hypercholesterolemia, unspecified: Secondary | ICD-10-CM | POA: Diagnosis not present

## 2018-12-13 DIAGNOSIS — H1013 Acute atopic conjunctivitis, bilateral: Secondary | ICD-10-CM | POA: Diagnosis not present

## 2018-12-13 DIAGNOSIS — I10 Essential (primary) hypertension: Secondary | ICD-10-CM | POA: Diagnosis not present

## 2018-12-13 DIAGNOSIS — M159 Polyosteoarthritis, unspecified: Secondary | ICD-10-CM | POA: Diagnosis not present

## 2019-03-22 DIAGNOSIS — G20 Parkinson's disease: Secondary | ICD-10-CM | POA: Diagnosis not present

## 2019-03-22 DIAGNOSIS — R269 Unspecified abnormalities of gait and mobility: Secondary | ICD-10-CM | POA: Diagnosis not present

## 2019-03-22 DIAGNOSIS — M1612 Unilateral primary osteoarthritis, left hip: Secondary | ICD-10-CM | POA: Diagnosis not present

## 2019-03-29 DIAGNOSIS — M25552 Pain in left hip: Secondary | ICD-10-CM | POA: Diagnosis not present

## 2019-03-29 DIAGNOSIS — R262 Difficulty in walking, not elsewhere classified: Secondary | ICD-10-CM | POA: Diagnosis not present

## 2019-04-04 DIAGNOSIS — M25552 Pain in left hip: Secondary | ICD-10-CM | POA: Diagnosis not present

## 2019-04-04 DIAGNOSIS — R262 Difficulty in walking, not elsewhere classified: Secondary | ICD-10-CM | POA: Diagnosis not present

## 2019-04-06 DIAGNOSIS — R262 Difficulty in walking, not elsewhere classified: Secondary | ICD-10-CM | POA: Diagnosis not present

## 2019-04-06 DIAGNOSIS — M25552 Pain in left hip: Secondary | ICD-10-CM | POA: Diagnosis not present

## 2019-04-18 DIAGNOSIS — I10 Essential (primary) hypertension: Secondary | ICD-10-CM | POA: Diagnosis not present

## 2019-04-18 DIAGNOSIS — R5382 Chronic fatigue, unspecified: Secondary | ICD-10-CM | POA: Diagnosis not present

## 2019-04-18 DIAGNOSIS — N400 Enlarged prostate without lower urinary tract symptoms: Secondary | ICD-10-CM | POA: Diagnosis not present

## 2019-04-18 DIAGNOSIS — M159 Polyosteoarthritis, unspecified: Secondary | ICD-10-CM | POA: Diagnosis not present

## 2019-04-18 DIAGNOSIS — Z139 Encounter for screening, unspecified: Secondary | ICD-10-CM | POA: Diagnosis not present

## 2019-04-18 DIAGNOSIS — Z23 Encounter for immunization: Secondary | ICD-10-CM | POA: Diagnosis not present

## 2019-04-19 DIAGNOSIS — D1801 Hemangioma of skin and subcutaneous tissue: Secondary | ICD-10-CM | POA: Diagnosis not present

## 2019-04-19 DIAGNOSIS — L821 Other seborrheic keratosis: Secondary | ICD-10-CM | POA: Diagnosis not present

## 2019-04-19 DIAGNOSIS — C44519 Basal cell carcinoma of skin of other part of trunk: Secondary | ICD-10-CM | POA: Diagnosis not present

## 2019-05-02 DIAGNOSIS — M159 Polyosteoarthritis, unspecified: Secondary | ICD-10-CM | POA: Diagnosis not present

## 2019-05-02 DIAGNOSIS — F5104 Psychophysiologic insomnia: Secondary | ICD-10-CM | POA: Diagnosis not present

## 2019-05-02 DIAGNOSIS — N400 Enlarged prostate without lower urinary tract symptoms: Secondary | ICD-10-CM | POA: Diagnosis not present

## 2019-05-02 DIAGNOSIS — E291 Testicular hypofunction: Secondary | ICD-10-CM | POA: Diagnosis not present

## 2019-05-05 ENCOUNTER — Emergency Department (HOSPITAL_COMMUNITY): Payer: Managed Care, Other (non HMO)

## 2019-05-05 ENCOUNTER — Other Ambulatory Visit: Payer: Self-pay

## 2019-05-05 ENCOUNTER — Encounter (HOSPITAL_COMMUNITY): Payer: Self-pay | Admitting: *Deleted

## 2019-05-05 ENCOUNTER — Emergency Department (HOSPITAL_COMMUNITY)
Admission: EM | Admit: 2019-05-05 | Discharge: 2019-05-05 | Disposition: A | Discharge status: Home / Self Care | Payer: Managed Care, Other (non HMO) | Attending: Emergency Medicine | Admitting: Emergency Medicine

## 2019-05-05 DIAGNOSIS — Z85828 Personal history of other malignant neoplasm of skin: Secondary | ICD-10-CM | POA: Insufficient documentation

## 2019-05-05 DIAGNOSIS — R231 Pallor: Secondary | ICD-10-CM | POA: Diagnosis not present

## 2019-05-05 DIAGNOSIS — I10 Essential (primary) hypertension: Secondary | ICD-10-CM | POA: Diagnosis not present

## 2019-05-05 DIAGNOSIS — R0789 Other chest pain: Secondary | ICD-10-CM | POA: Diagnosis not present

## 2019-05-05 DIAGNOSIS — Z79899 Other long term (current) drug therapy: Secondary | ICD-10-CM | POA: Insufficient documentation

## 2019-05-05 DIAGNOSIS — Z7984 Long term (current) use of oral hypoglycemic drugs: Secondary | ICD-10-CM | POA: Diagnosis not present

## 2019-05-05 DIAGNOSIS — Z7982 Long term (current) use of aspirin: Secondary | ICD-10-CM | POA: Insufficient documentation

## 2019-05-05 DIAGNOSIS — Z8673 Personal history of transient ischemic attack (TIA), and cerebral infarction without residual deficits: Secondary | ICD-10-CM | POA: Insufficient documentation

## 2019-05-05 DIAGNOSIS — R0602 Shortness of breath: Secondary | ICD-10-CM | POA: Diagnosis not present

## 2019-05-05 DIAGNOSIS — R110 Nausea: Secondary | ICD-10-CM | POA: Diagnosis not present

## 2019-05-05 DIAGNOSIS — Z87891 Personal history of nicotine dependence: Secondary | ICD-10-CM | POA: Diagnosis not present

## 2019-05-05 DIAGNOSIS — E119 Type 2 diabetes mellitus without complications: Secondary | ICD-10-CM | POA: Insufficient documentation

## 2019-05-05 DIAGNOSIS — R0682 Tachypnea, not elsewhere classified: Secondary | ICD-10-CM | POA: Diagnosis not present

## 2019-05-05 DIAGNOSIS — R079 Chest pain, unspecified: Secondary | ICD-10-CM | POA: Diagnosis not present

## 2019-05-05 LAB — BASIC METABOLIC PANEL
Anion gap: 13 (ref 5–15)
BUN: 29 mg/dL — ABNORMAL HIGH (ref 8–23)
CO2: 21 mmol/L — ABNORMAL LOW (ref 22–32)
Calcium: 8.9 mg/dL (ref 8.9–10.3)
Chloride: 106 mmol/L (ref 98–111)
Creatinine, Ser: 1.57 mg/dL — ABNORMAL HIGH (ref 0.61–1.24)
GFR calc Af Amer: 49 mL/min — ABNORMAL LOW (ref >60)
GFR calc non Af Amer: 42 mL/min — ABNORMAL LOW (ref >60)
Glucose, Bld: 112 mg/dL — ABNORMAL HIGH (ref 70–99)
Potassium: 4.1 mmol/L (ref 3.5–5.1)
Sodium: 140 mmol/L (ref 135–145)

## 2019-05-05 LAB — CBC
HCT: 35.4 % — ABNORMAL LOW (ref 39.0–52.0)
Hemoglobin: 12.2 g/dL — ABNORMAL LOW (ref 13.0–17.0)
MCH: 31.1 pg (ref 26.0–34.0)
MCHC: 34.5 g/dL (ref 30.0–36.0)
MCV: 90.3 fL (ref 80.0–100.0)
Platelets: 225 10*3/uL (ref 150–400)
RBC: 3.92 MIL/uL — ABNORMAL LOW (ref 4.22–5.81)
RDW: 12.1 % (ref 11.5–15.5)
WBC: 9 10*3/uL (ref 4.0–10.5)
nRBC: 0 % (ref 0.0–0.2)

## 2019-05-05 LAB — TROPONIN I (HIGH SENSITIVITY)
Troponin I (High Sensitivity): 5 ng/L (ref <18)
Troponin I (High Sensitivity): 6 ng/L (ref <18)

## 2019-05-05 LAB — D-DIMER, QUANTITATIVE: D-Dimer, Quant: 0.42 ug/mL-FEU (ref 0.00–0.50)

## 2019-05-05 MED ORDER — SODIUM CHLORIDE 0.9 % IV BOLUS (SEPSIS)
500.0000 mL | Freq: Once | INTRAVENOUS | Status: AC
  Administered 2019-05-05: 500 mL via INTRAVENOUS

## 2019-05-05 MED ORDER — ACETAMINOPHEN 500 MG PO TABS
1000.0000 mg | ORAL_TABLET | Freq: Once | ORAL | Status: AC
  Administered 2019-05-05: 03:00:00 1000 mg via ORAL
  Filled 2019-05-05: qty 2

## 2019-05-05 MED ORDER — FENTANYL CITRATE (PF) 100 MCG/2ML IJ SOLN
50.0000 ug | Freq: Once | INTRAMUSCULAR | Status: AC
  Administered 2019-05-05: 05:00:00 50 ug via INTRAVENOUS
  Filled 2019-05-05: qty 2

## 2019-05-05 MED ORDER — SODIUM CHLORIDE 0.9% FLUSH: 3.0000 mL | Freq: Once | INTRAVENOUS | Status: DC

## 2019-05-05 NOTE — Discharge Instructions (Addendum)
Your labs, x-ray, EKG today were reassuring.  No sign of heart attack, pneumonia, heart failure, blood clot.  You may take Tylenol 1000 mg every 6 hours as needed for pain.

## 2019-05-05 NOTE — ED Notes (Signed)
C/o a severe headache med given

## 2019-05-05 NOTE — ED Triage Notes (Signed)
The pt arrived from home by [facility] lt sided chest pain that started at hs sob nausea also pain worse with pressure  Pt was given 324mg  of aspirin and fentanyl 155mcg and zofran 4mg  iv wife just arrived

## 2019-05-05 NOTE — ED Provider Notes (Signed)
TIME SEEN: 1:23 AM  CHIEF COMPLAINT: Chest pain  HPI: Patient is a 78 year old male with history of hypertension, diabetes, previous intracranial aneurysm status post titanium clip at [facility] in 2002, stroke, Parkinson's who presents to the emergency department with complaints of left sharp chest pain.  Pain worse with movement, deep inspiration and palpation.  States it started around 11:30 PM after rolling over in bed.  No injury to his chest or increased exertion.  He states he feels short of breath and somewhat nauseous secondary to pain.  No lower extremity swelling or discomfort.  No history of PE or DVT.  He takes baby aspirin daily.  Given 324 mg of aspirin with EMS, fentanyl and Zofran.  Was not given nitroglycerin.  Patient's wife is concerned about his blood pressure.  It is in the 100s/60s.  He is on blood pressure medications.  He does not feel lightheaded, dizzy.  She is not sure what his blood pressure normally runs.  No fever, cough, vomiting or diarrhea.  Patient also complains of a frontal headache feeling like a band around his forehead.  He states that this is not severe or sudden onset he has had similar headaches before.  States last headache like this was a week ago.  He mentions this in passing and request pain medication for it.  States he would not have come to the ER just for this headache.  Denies numbness, tingling or focal weakness.  No head injury.  Not on anticoagulants.  ROS: See HPI Constitutional: no fever  Eyes: no drainage  ENT: no runny nose   Cardiovascular:   chest pain  Resp:  SOB  GI: no vomiting GU: no dysuria Integumentary: no rash  Allergy: no hives  Musculoskeletal: no leg swelling  Neurological: no slurred speech ROS otherwise negative  PAST MEDICAL HISTORY/PAST SURGICAL HISTORY:  Past Medical History:  Diagnosis Date  . Aneurysm (arteriovenous) of coronary vessels 2002   titanium clips  . Back pain   . Basal cell cancer   . Diabetes  mellitus without complication ([place])   . History of kidney stones   . Hypertension   . Pneumonia    hx  . Stroke ([place]) 2002  . UTI (lower urinary tract infection) 9/16    MEDICATIONS:  Prior to Admission medications   Medication Sig Start Date End Date Taking? Authorizing Provider  amLODipine (NORVASC) 10 MG tablet Take 10 mg by mouth daily.    [provider]  aspirin 81 MG tablet Take 81 mg by mouth daily.    [provider]  aspirin EC 81 MG tablet Take 81 mg by mouth.    [provider]  carbidopa-levodopa (SINEMET IR) 25-100 MG tablet Take 1 tablet by mouth 3 (three) times daily. 10/31/15   [provider]  diazepam (VALIUM) 5 MG tablet Take 1-2 tablets (5-10 mg total) by mouth every 6 (six) hours as needed for muscle spasms. Patient not taking: Reported on 03/27/2017 01/09/16   Earnie Larsson, MD  gabapentin (NEURONTIN) 100 MG capsule Take 100 mg by mouth at bedtime as needed (for pain).  10/17/15   [provider]  glucosamine-chondroitin 500-400 MG tablet Take 1 tablet by mouth daily.    [provider]  HYDROcodone-acetaminophen (NORCO) 7.5-325 MG tablet Take 1 tablet by mouth 2 (two) times daily as needed for moderate pain.  12/01/15   [provider]  HYDROcodone-acetaminophen (NORCO) 7.5-325 MG tablet Take by mouth. 05/11/14   [provider]  ibuprofen (ADVIL,MOTRIN) 200 MG tablet Take 400 mg by mouth 2 (two) times daily as needed for moderate pain.    [provider]  losartan (COZAAR) 100 MG tablet  02/25/17   [provider]  metFORMIN (GLUCOPHAGE) 850 MG tablet Take 850 mg by mouth 3 (three) times daily. 09/26/15   [provider]  Multiple Vitamin (MULTIVITAMIN) tablet Take 1 tablet by mouth daily.    [provider]  naphazoline-glycerin (CLEAR EYES) 0.012-0.2 % SOLN Place 1-2 drops into both eyes daily as needed for irritation.    [provider]  olopatadine  (PATANOL) 0.1 % ophthalmic solution Place 1 drop into both eyes at bedtime as needed for allergies.    [provider]  OMEGA-3 KRILL OIL PO Take 350 mg by mouth daily.    [provider]  omeprazole (PRILOSEC) 40 MG capsule Take 40 mg by mouth daily.  10/21/15   [provider]  oxyCODONE-acetaminophen (PERCOCET/ROXICET) 5-325 MG tablet Take 1-2 tablets by mouth every 4 (four) hours as needed for moderate pain. Patient not taking: Reported on 03/27/2017 01/09/16   Earnie Larsson, MD  polycarbophil (FIBERCON) 625 MG tablet Take 625 mg by mouth every other day.    [provider]  rosuvastatin (CRESTOR) 10 MG tablet Take 10 mg by mouth daily.    [provider]  tamsulosin (FLOMAX) 0.4 MG CAPS capsule Take 0.4 mg by mouth daily after breakfast.     [provider]  tetrahydrozoline-zinc (VISINE-AC) 0.05-0.25 % ophthalmic solution Place 1 drop into both eyes daily as needed (for dry eyes).    [provider]  valsartan (DIOVAN) 320 MG tablet Take 320 mg by mouth daily.    [provider]    ALLERGIES:  No Known Allergies  SOCIAL HISTORY:  Social History   Tobacco Use  . Smoking status: Former Smoker    Packs/day: 1.50    Years: 32.00    Pack years: 48.00    Types: Cigarettes    Quit date: 01/01/1997    Years since quitting: 22.3  . Smokeless tobacco: Never Used  Substance Use Topics  . Alcohol use: No    FAMILY HISTORY: No family history on file.  EXAM: BP 107/64 (BP Location: Right Arm)   Pulse 79   Temp 98 F (36.7 C) (Oral)   Resp 18   Wt 87.1 kg   SpO2 96%   BMI 30.99 kg/m  CONSTITUTIONAL: Alert and oriented and responds appropriately to questions.  Elderly, in no distress HEAD: Normocephalic, atraumatic EYES: Conjunctivae clear, pupils appear equal, EOMI ENT: normal nose; moist mucous membranes NECK: Supple, no meningismus, no nuchal rigidity, no LAD  CARD: RRR; S1 and S2 appreciated; no murmurs, no  clicks, no rubs, no gallops CHEST:  Chest wall is tender to palpation over the left anterior chest wall which reproduces his pain.  No crepitus, ecchymosis, erythema, warmth, rash or other lesions present.   RESP: Normal chest excursion without splinting or tachypnea; breath sounds clear and equal bilaterally; no wheezes, no rhonchi, no rales, no hypoxia or respiratory distress, speaking full sentences ABD/GI: Normal bowel sounds; non-distended; soft, non-tender, no rebound, no guarding, no peritoneal signs, no hepatosplenomegaly BACK:  The back appears normal and is non-tender to palpation, there is no CVA tenderness EXT: Normal ROM in all joints; non-tender to palpation; no edema; normal capillary refill; no cyanosis, no calf tenderness or swelling    SKIN: Normal color for age and race; warm; no rash NEURO:  Moves all extremities equally, mild tremors, sensation to light touch intact diffusely, normal speech, no facial asymmetry PSYCH: The patient's mood and manner are appropriate. Grooming and personal hygiene are appropriate.  MEDICAL DECISION MAKING: Patient here with atypical chest pain.  Seems to be musculoskeletal in nature.  It is worse with twisting, moving in the bed.  Low suspicion for ACS but he does have several risk factors.  Will obtain 2 troponins.  He also describes it as a pleuritic pain but low suspicion for PE.  Will rule out with d-dimer.  Chest x-ray obtained shows no acute abnormality.  He has no infectious symptoms to suggest COVID or pneumonia.  Does not appear volume overloaded here.  Will give Tylenol for his chest pain as well as his headache.  He states he has headaches similar to this frequently and states the headache is not severe or sudden in onset.  He has no head injury and is not on anticoagulants.  No focal neurologic deficits.  I do not feel we need to perform a head CT at this time and patient agrees.  He states that the headache alone would not have brought him to  the emergency department.  He states he is here because of the chest pain.  ED PROGRESS: 4:30 AM  Pt's labs show a mildly elevated creatinine of 1.57.  Will give IV fluids.  He has 2 troponins here that are negative and a negative d-dimer.  His chest x-ray is clear.  Still complaining of some atypical chest pain.  Do not think this is ACS and do not feel he needs nitroglycerin.  Will give dose of IV fentanyl given he reports this helped him when he arrived by EMS.  5:20 AM  Pt's pain has improved.  I feel he is safe to be discharged home.  Patient and wife agree with this plan.  He will take Tylenol as needed for pain.  Discussed with patient that this seems very atypical for ACS and I suspect that his pain is musculoskeletal in nature.  At this time, I do not feel there is any life-threatening condition present. I have reviewed and discussed all results (EKG, imaging, lab, urine as appropriate) and exam findings with patient/family. I have reviewed nursing notes and appropriate previous records.  I feel the patient is safe to be discharged home without further emergent workup and can continue workup as an outpatient as needed. Discussed usual and customary return precautions. Patient/family verbalize understanding and are comfortable with this plan.  Outpatient follow-up has been provided as needed. All questions have been answered.   Micheal [name]. was evaluated in Emergency Department on 05/05/2019 for the symptoms described in the history of present illness. He was evaluated in the context of the global COVID-19 pandemic, which necessitated consideration that the patient might be at risk for infection with the SARS-CoV-2 virus that causes COVID-19. Institutional protocols and algorithms that pertain to the evaluation of patients at risk for COVID-19 are in a state of rapid change based on information released by regulatory bodies including the CDC and federal and state organizations. These policies  and algorithms were followed during the patient's care in the ED.    EKG Interpretation  Date/Time:  Thursday May 05 2019 01:09:45 EDT Ventricular Rate:  76 PR Interval:    QRS Duration: 81 QT Interval:  358 QTC Calculation: 403 R Axis:   -16 Text Interpretation:  Sinus rhythm Short PR interval Borderline left axis deviation No  significant change since last tracing Confirmed by Yazlynn Birkeland, Cyril Mourning [phone]) on 05/05/2019 1:14:23 AM         Alban Marucci, Delice Bison, DO 05/05/19 8721

## 2019-05-05 NOTE — ED Notes (Signed)
The pt is still c/o a headache  Pain med given the pt has been on his call bell constantly c/o the monitor making too much noise headache etc

## 2019-05-18 DIAGNOSIS — E119 Type 2 diabetes mellitus without complications: Secondary | ICD-10-CM | POA: Diagnosis not present

## 2019-05-30 DIAGNOSIS — M79604 Pain in right leg: Secondary | ICD-10-CM | POA: Diagnosis not present

## 2019-05-30 DIAGNOSIS — F5104 Psychophysiologic insomnia: Secondary | ICD-10-CM | POA: Diagnosis not present

## 2019-05-30 DIAGNOSIS — M25551 Pain in right hip: Secondary | ICD-10-CM | POA: Diagnosis not present

## 2019-05-30 DIAGNOSIS — M159 Polyosteoarthritis, unspecified: Secondary | ICD-10-CM | POA: Diagnosis not present

## 2019-05-30 DIAGNOSIS — M25531 Pain in right wrist: Secondary | ICD-10-CM | POA: Diagnosis not present

## 2019-05-30 DIAGNOSIS — S79911A Unspecified injury of right hip, initial encounter: Secondary | ICD-10-CM | POA: Diagnosis not present

## 2019-05-30 DIAGNOSIS — S3993XA Unspecified injury of pelvis, initial encounter: Secondary | ICD-10-CM | POA: Diagnosis not present

## 2019-05-30 DIAGNOSIS — R52 Pain, unspecified: Secondary | ICD-10-CM | POA: Diagnosis not present

## 2019-05-30 DIAGNOSIS — S79921A Unspecified injury of right thigh, initial encounter: Secondary | ICD-10-CM | POA: Diagnosis not present

## 2019-05-30 DIAGNOSIS — N201 Calculus of ureter: Secondary | ICD-10-CM | POA: Diagnosis not present

## 2019-05-30 DIAGNOSIS — M25571 Pain in right ankle and joints of right foot: Secondary | ICD-10-CM | POA: Diagnosis not present

## 2019-05-30 DIAGNOSIS — S6991XA Unspecified injury of right wrist, hand and finger(s), initial encounter: Secondary | ICD-10-CM | POA: Diagnosis not present

## 2019-05-30 DIAGNOSIS — S99911A Unspecified injury of right ankle, initial encounter: Secondary | ICD-10-CM | POA: Diagnosis not present

## 2019-05-30 DIAGNOSIS — E291 Testicular hypofunction: Secondary | ICD-10-CM | POA: Diagnosis not present

## 2019-05-30 DIAGNOSIS — W19XXXA Unspecified fall, initial encounter: Secondary | ICD-10-CM | POA: Diagnosis not present

## 2019-05-30 DIAGNOSIS — R0789 Other chest pain: Secondary | ICD-10-CM | POA: Diagnosis not present

## 2019-06-09 DIAGNOSIS — N200 Calculus of kidney: Secondary | ICD-10-CM | POA: Diagnosis not present

## 2019-06-09 DIAGNOSIS — N401 Enlarged prostate with lower urinary tract symptoms: Secondary | ICD-10-CM | POA: Diagnosis not present

## 2019-06-17 DIAGNOSIS — Z20828 Contact with and (suspected) exposure to other viral communicable diseases: Secondary | ICD-10-CM | POA: Diagnosis not present

## 2019-06-17 DIAGNOSIS — M159 Polyosteoarthritis, unspecified: Secondary | ICD-10-CM | POA: Diagnosis not present

## 2019-06-17 DIAGNOSIS — R6889 Other general symptoms and signs: Secondary | ICD-10-CM | POA: Diagnosis not present

## 2019-06-17 DIAGNOSIS — N400 Enlarged prostate without lower urinary tract symptoms: Secondary | ICD-10-CM | POA: Diagnosis not present

## 2019-06-21 DIAGNOSIS — M159 Polyosteoarthritis, unspecified: Secondary | ICD-10-CM | POA: Diagnosis not present

## 2019-06-21 DIAGNOSIS — G20 Parkinson's disease: Secondary | ICD-10-CM | POA: Diagnosis not present

## 2019-06-21 DIAGNOSIS — N400 Enlarged prostate without lower urinary tract symptoms: Secondary | ICD-10-CM | POA: Diagnosis not present

## 2019-06-21 DIAGNOSIS — U071 COVID-19: Secondary | ICD-10-CM | POA: Diagnosis not present

## 2019-06-27 DIAGNOSIS — N400 Enlarged prostate without lower urinary tract symptoms: Secondary | ICD-10-CM | POA: Diagnosis not present

## 2019-06-27 DIAGNOSIS — M159 Polyosteoarthritis, unspecified: Secondary | ICD-10-CM | POA: Diagnosis not present

## 2019-06-27 DIAGNOSIS — R197 Diarrhea, unspecified: Secondary | ICD-10-CM | POA: Diagnosis not present

## 2019-06-27 DIAGNOSIS — U071 COVID-19: Secondary | ICD-10-CM | POA: Diagnosis not present

## 2019-07-02 DIAGNOSIS — R5382 Chronic fatigue, unspecified: Secondary | ICD-10-CM | POA: Diagnosis not present

## 2019-07-02 DIAGNOSIS — F5104 Psychophysiologic insomnia: Secondary | ICD-10-CM | POA: Diagnosis not present

## 2019-07-02 DIAGNOSIS — E291 Testicular hypofunction: Secondary | ICD-10-CM | POA: Diagnosis not present

## 2019-07-02 DIAGNOSIS — M159 Polyosteoarthritis, unspecified: Secondary | ICD-10-CM | POA: Diagnosis not present

## 2019-07-07 DIAGNOSIS — M159 Polyosteoarthritis, unspecified: Secondary | ICD-10-CM | POA: Diagnosis not present

## 2019-07-07 DIAGNOSIS — F5104 Psychophysiologic insomnia: Secondary | ICD-10-CM | POA: Diagnosis not present

## 2019-07-07 DIAGNOSIS — N184 Chronic kidney disease, stage 4 (severe): Secondary | ICD-10-CM | POA: Diagnosis not present

## 2019-07-07 DIAGNOSIS — E291 Testicular hypofunction: Secondary | ICD-10-CM | POA: Diagnosis not present

## 2019-07-15 DIAGNOSIS — M159 Polyosteoarthritis, unspecified: Secondary | ICD-10-CM | POA: Diagnosis not present

## 2019-07-15 DIAGNOSIS — E291 Testicular hypofunction: Secondary | ICD-10-CM | POA: Diagnosis not present

## 2019-07-15 DIAGNOSIS — F5104 Psychophysiologic insomnia: Secondary | ICD-10-CM | POA: Diagnosis not present

## 2019-07-15 DIAGNOSIS — N184 Chronic kidney disease, stage 4 (severe): Secondary | ICD-10-CM | POA: Diagnosis not present

## 2019-07-21 DIAGNOSIS — E291 Testicular hypofunction: Secondary | ICD-10-CM | POA: Diagnosis not present

## 2019-07-21 DIAGNOSIS — N184 Chronic kidney disease, stage 4 (severe): Secondary | ICD-10-CM | POA: Diagnosis not present

## 2019-07-21 DIAGNOSIS — F5104 Psychophysiologic insomnia: Secondary | ICD-10-CM | POA: Diagnosis not present

## 2019-07-21 DIAGNOSIS — M159 Polyosteoarthritis, unspecified: Secondary | ICD-10-CM | POA: Diagnosis not present

## 2019-07-28 DIAGNOSIS — E291 Testicular hypofunction: Secondary | ICD-10-CM | POA: Diagnosis not present

## 2019-07-28 DIAGNOSIS — Z6827 Body mass index (BMI) 27.0-27.9, adult: Secondary | ICD-10-CM | POA: Diagnosis not present

## 2019-07-28 DIAGNOSIS — N184 Chronic kidney disease, stage 4 (severe): Secondary | ICD-10-CM | POA: Diagnosis not present

## 2019-07-28 DIAGNOSIS — M159 Polyosteoarthritis, unspecified: Secondary | ICD-10-CM | POA: Diagnosis not present

## 2019-08-03 DIAGNOSIS — E291 Testicular hypofunction: Secondary | ICD-10-CM | POA: Diagnosis not present

## 2019-08-03 DIAGNOSIS — M159 Polyosteoarthritis, unspecified: Secondary | ICD-10-CM | POA: Diagnosis not present

## 2019-08-03 DIAGNOSIS — N184 Chronic kidney disease, stage 4 (severe): Secondary | ICD-10-CM | POA: Diagnosis not present

## 2019-08-03 DIAGNOSIS — F5104 Psychophysiologic insomnia: Secondary | ICD-10-CM | POA: Diagnosis not present

## 2019-08-17 DIAGNOSIS — E291 Testicular hypofunction: Secondary | ICD-10-CM | POA: Diagnosis not present

## 2019-08-17 DIAGNOSIS — N1831 Chronic kidney disease, stage 3a: Secondary | ICD-10-CM | POA: Diagnosis not present

## 2019-08-17 DIAGNOSIS — M159 Polyosteoarthritis, unspecified: Secondary | ICD-10-CM | POA: Diagnosis not present

## 2019-08-17 DIAGNOSIS — F5104 Psychophysiologic insomnia: Secondary | ICD-10-CM | POA: Diagnosis not present

## 2019-08-31 DIAGNOSIS — N1831 Chronic kidney disease, stage 3a: Secondary | ICD-10-CM | POA: Diagnosis not present

## 2019-08-31 DIAGNOSIS — E291 Testicular hypofunction: Secondary | ICD-10-CM | POA: Diagnosis not present

## 2019-08-31 DIAGNOSIS — M159 Polyosteoarthritis, unspecified: Secondary | ICD-10-CM | POA: Diagnosis not present

## 2019-08-31 DIAGNOSIS — F5104 Psychophysiologic insomnia: Secondary | ICD-10-CM | POA: Diagnosis not present

## 2019-09-14 DIAGNOSIS — F5104 Psychophysiologic insomnia: Secondary | ICD-10-CM | POA: Diagnosis not present

## 2019-09-14 DIAGNOSIS — E291 Testicular hypofunction: Secondary | ICD-10-CM | POA: Diagnosis not present

## 2019-09-14 DIAGNOSIS — M159 Polyosteoarthritis, unspecified: Secondary | ICD-10-CM | POA: Diagnosis not present

## 2019-09-14 DIAGNOSIS — N1832 Chronic kidney disease, stage 3b: Secondary | ICD-10-CM | POA: Diagnosis not present

## 2019-09-28 DIAGNOSIS — N1832 Chronic kidney disease, stage 3b: Secondary | ICD-10-CM | POA: Diagnosis not present

## 2019-09-28 DIAGNOSIS — F5104 Psychophysiologic insomnia: Secondary | ICD-10-CM | POA: Diagnosis not present

## 2019-09-28 DIAGNOSIS — M159 Polyosteoarthritis, unspecified: Secondary | ICD-10-CM | POA: Diagnosis not present

## 2019-09-28 DIAGNOSIS — E291 Testicular hypofunction: Secondary | ICD-10-CM | POA: Diagnosis not present

## 2019-10-12 DIAGNOSIS — M159 Polyosteoarthritis, unspecified: Secondary | ICD-10-CM | POA: Diagnosis not present

## 2019-10-12 DIAGNOSIS — E291 Testicular hypofunction: Secondary | ICD-10-CM | POA: Diagnosis not present

## 2019-10-12 DIAGNOSIS — N1832 Chronic kidney disease, stage 3b: Secondary | ICD-10-CM | POA: Diagnosis not present

## 2019-10-12 DIAGNOSIS — F5104 Psychophysiologic insomnia: Secondary | ICD-10-CM | POA: Diagnosis not present

## 2019-10-26 DIAGNOSIS — E291 Testicular hypofunction: Secondary | ICD-10-CM | POA: Diagnosis not present

## 2019-10-26 DIAGNOSIS — N1832 Chronic kidney disease, stage 3b: Secondary | ICD-10-CM | POA: Diagnosis not present

## 2019-10-26 DIAGNOSIS — M159 Polyosteoarthritis, unspecified: Secondary | ICD-10-CM | POA: Diagnosis not present

## 2019-10-26 DIAGNOSIS — F5104 Psychophysiologic insomnia: Secondary | ICD-10-CM | POA: Diagnosis not present

## 2019-11-23 DIAGNOSIS — I10 Essential (primary) hypertension: Secondary | ICD-10-CM | POA: Diagnosis not present

## 2019-11-23 DIAGNOSIS — F5104 Psychophysiologic insomnia: Secondary | ICD-10-CM | POA: Diagnosis not present

## 2019-11-23 DIAGNOSIS — E7800 Pure hypercholesterolemia, unspecified: Secondary | ICD-10-CM | POA: Diagnosis not present

## 2019-11-23 DIAGNOSIS — M159 Polyosteoarthritis, unspecified: Secondary | ICD-10-CM | POA: Diagnosis not present

## 2019-11-23 DIAGNOSIS — E118 Type 2 diabetes mellitus with unspecified complications: Secondary | ICD-10-CM | POA: Diagnosis not present

## 2019-11-23 DIAGNOSIS — N1832 Chronic kidney disease, stage 3b: Secondary | ICD-10-CM | POA: Diagnosis not present

## 2019-11-23 DIAGNOSIS — E291 Testicular hypofunction: Secondary | ICD-10-CM | POA: Diagnosis not present

## 2019-12-05 DIAGNOSIS — M159 Polyosteoarthritis, unspecified: Secondary | ICD-10-CM | POA: Diagnosis not present

## 2019-12-05 DIAGNOSIS — J208 Acute bronchitis due to other specified organisms: Secondary | ICD-10-CM | POA: Diagnosis not present

## 2019-12-05 DIAGNOSIS — B9689 Other specified bacterial agents as the cause of diseases classified elsewhere: Secondary | ICD-10-CM | POA: Diagnosis not present

## 2019-12-05 DIAGNOSIS — E291 Testicular hypofunction: Secondary | ICD-10-CM | POA: Diagnosis not present

## 2019-12-07 DIAGNOSIS — M159 Polyosteoarthritis, unspecified: Secondary | ICD-10-CM | POA: Diagnosis not present

## 2019-12-07 DIAGNOSIS — B9689 Other specified bacterial agents as the cause of diseases classified elsewhere: Secondary | ICD-10-CM | POA: Diagnosis not present

## 2019-12-07 DIAGNOSIS — J208 Acute bronchitis due to other specified organisms: Secondary | ICD-10-CM | POA: Diagnosis not present

## 2019-12-07 DIAGNOSIS — I10 Essential (primary) hypertension: Secondary | ICD-10-CM | POA: Diagnosis not present

## 2019-12-07 DIAGNOSIS — E291 Testicular hypofunction: Secondary | ICD-10-CM | POA: Diagnosis not present

## 2019-12-21 DIAGNOSIS — G20 Parkinson's disease: Secondary | ICD-10-CM | POA: Diagnosis not present

## 2019-12-21 DIAGNOSIS — M159 Polyosteoarthritis, unspecified: Secondary | ICD-10-CM | POA: Diagnosis not present

## 2019-12-21 DIAGNOSIS — J208 Acute bronchitis due to other specified organisms: Secondary | ICD-10-CM | POA: Diagnosis not present

## 2019-12-21 DIAGNOSIS — E291 Testicular hypofunction: Secondary | ICD-10-CM | POA: Diagnosis not present

## 2019-12-21 DIAGNOSIS — R269 Unspecified abnormalities of gait and mobility: Secondary | ICD-10-CM | POA: Diagnosis not present

## 2019-12-21 DIAGNOSIS — F5104 Psychophysiologic insomnia: Secondary | ICD-10-CM | POA: Diagnosis not present

## 2020-01-04 DIAGNOSIS — M159 Polyosteoarthritis, unspecified: Secondary | ICD-10-CM | POA: Diagnosis not present

## 2020-01-04 DIAGNOSIS — E291 Testicular hypofunction: Secondary | ICD-10-CM | POA: Diagnosis not present

## 2020-01-04 DIAGNOSIS — N400 Enlarged prostate without lower urinary tract symptoms: Secondary | ICD-10-CM | POA: Diagnosis not present

## 2020-01-04 DIAGNOSIS — I10 Essential (primary) hypertension: Secondary | ICD-10-CM | POA: Diagnosis not present

## 2020-01-04 DIAGNOSIS — F5104 Psychophysiologic insomnia: Secondary | ICD-10-CM | POA: Diagnosis not present

## 2020-01-18 DIAGNOSIS — M159 Polyosteoarthritis, unspecified: Secondary | ICD-10-CM | POA: Diagnosis not present

## 2020-01-18 DIAGNOSIS — F5104 Psychophysiologic insomnia: Secondary | ICD-10-CM | POA: Diagnosis not present

## 2020-01-18 DIAGNOSIS — E291 Testicular hypofunction: Secondary | ICD-10-CM | POA: Diagnosis not present

## 2020-01-18 DIAGNOSIS — N400 Enlarged prostate without lower urinary tract symptoms: Secondary | ICD-10-CM | POA: Diagnosis not present

## 2020-02-15 DIAGNOSIS — G20 Parkinson's disease: Secondary | ICD-10-CM | POA: Diagnosis not present

## 2020-02-15 DIAGNOSIS — I679 Cerebrovascular disease, unspecified: Secondary | ICD-10-CM | POA: Diagnosis not present

## 2020-02-15 DIAGNOSIS — N400 Enlarged prostate without lower urinary tract symptoms: Secondary | ICD-10-CM | POA: Diagnosis not present

## 2020-02-15 DIAGNOSIS — E291 Testicular hypofunction: Secondary | ICD-10-CM | POA: Diagnosis not present

## 2020-02-15 DIAGNOSIS — E7800 Pure hypercholesterolemia, unspecified: Secondary | ICD-10-CM | POA: Diagnosis not present

## 2020-02-15 DIAGNOSIS — Z9181 History of falling: Secondary | ICD-10-CM | POA: Diagnosis not present

## 2020-02-15 DIAGNOSIS — E118 Type 2 diabetes mellitus with unspecified complications: Secondary | ICD-10-CM | POA: Diagnosis not present

## 2020-02-15 DIAGNOSIS — Z1331 Encounter for screening for depression: Secondary | ICD-10-CM | POA: Diagnosis not present

## 2020-02-15 DIAGNOSIS — M159 Polyosteoarthritis, unspecified: Secondary | ICD-10-CM | POA: Diagnosis not present

## 2020-02-15 DIAGNOSIS — R600 Localized edema: Secondary | ICD-10-CM | POA: Diagnosis not present

## 2020-02-15 DIAGNOSIS — M545 Low back pain: Secondary | ICD-10-CM | POA: Diagnosis not present

## 2020-02-15 DIAGNOSIS — G8929 Other chronic pain: Secondary | ICD-10-CM | POA: Diagnosis not present

## 2020-02-15 DIAGNOSIS — I2510 Atherosclerotic heart disease of native coronary artery without angina pectoris: Secondary | ICD-10-CM | POA: Diagnosis not present

## 2020-02-15 DIAGNOSIS — F5104 Psychophysiologic insomnia: Secondary | ICD-10-CM | POA: Diagnosis not present

## 2020-03-21 DIAGNOSIS — E118 Type 2 diabetes mellitus with unspecified complications: Secondary | ICD-10-CM | POA: Diagnosis not present

## 2020-03-21 DIAGNOSIS — I2510 Atherosclerotic heart disease of native coronary artery without angina pectoris: Secondary | ICD-10-CM | POA: Diagnosis not present

## 2020-03-21 DIAGNOSIS — E291 Testicular hypofunction: Secondary | ICD-10-CM | POA: Diagnosis not present

## 2020-03-21 DIAGNOSIS — E7800 Pure hypercholesterolemia, unspecified: Secondary | ICD-10-CM | POA: Diagnosis not present

## 2020-03-21 DIAGNOSIS — R600 Localized edema: Secondary | ICD-10-CM | POA: Diagnosis not present

## 2020-03-21 DIAGNOSIS — F5104 Psychophysiologic insomnia: Secondary | ICD-10-CM | POA: Diagnosis not present

## 2020-03-21 DIAGNOSIS — G8929 Other chronic pain: Secondary | ICD-10-CM | POA: Diagnosis not present

## 2020-03-21 DIAGNOSIS — I679 Cerebrovascular disease, unspecified: Secondary | ICD-10-CM | POA: Diagnosis not present

## 2020-03-21 DIAGNOSIS — M159 Polyosteoarthritis, unspecified: Secondary | ICD-10-CM | POA: Diagnosis not present

## 2020-03-21 DIAGNOSIS — G20 Parkinson's disease: Secondary | ICD-10-CM | POA: Diagnosis not present

## 2020-03-21 DIAGNOSIS — N400 Enlarged prostate without lower urinary tract symptoms: Secondary | ICD-10-CM | POA: Diagnosis not present

## 2020-03-21 DIAGNOSIS — M545 Low back pain: Secondary | ICD-10-CM | POA: Diagnosis not present

## 2020-04-11 DIAGNOSIS — L309 Dermatitis, unspecified: Secondary | ICD-10-CM | POA: Diagnosis not present

## 2020-04-11 DIAGNOSIS — L820 Inflamed seborrheic keratosis: Secondary | ICD-10-CM | POA: Diagnosis not present

## 2020-04-11 DIAGNOSIS — L821 Other seborrheic keratosis: Secondary | ICD-10-CM | POA: Diagnosis not present

## 2020-04-18 DIAGNOSIS — I2510 Atherosclerotic heart disease of native coronary artery without angina pectoris: Secondary | ICD-10-CM | POA: Diagnosis not present

## 2020-04-18 DIAGNOSIS — G20 Parkinson's disease: Secondary | ICD-10-CM | POA: Diagnosis not present

## 2020-04-18 DIAGNOSIS — E7800 Pure hypercholesterolemia, unspecified: Secondary | ICD-10-CM | POA: Diagnosis not present

## 2020-04-18 DIAGNOSIS — M545 Low back pain: Secondary | ICD-10-CM | POA: Diagnosis not present

## 2020-04-18 DIAGNOSIS — E291 Testicular hypofunction: Secondary | ICD-10-CM | POA: Diagnosis not present

## 2020-04-18 DIAGNOSIS — G8929 Other chronic pain: Secondary | ICD-10-CM | POA: Diagnosis not present

## 2020-04-18 DIAGNOSIS — F5104 Psychophysiologic insomnia: Secondary | ICD-10-CM | POA: Diagnosis not present

## 2020-04-18 DIAGNOSIS — N400 Enlarged prostate without lower urinary tract symptoms: Secondary | ICD-10-CM | POA: Diagnosis not present

## 2020-04-18 DIAGNOSIS — R600 Localized edema: Secondary | ICD-10-CM | POA: Diagnosis not present

## 2020-04-18 DIAGNOSIS — I679 Cerebrovascular disease, unspecified: Secondary | ICD-10-CM | POA: Diagnosis not present

## 2020-04-18 DIAGNOSIS — E118 Type 2 diabetes mellitus with unspecified complications: Secondary | ICD-10-CM | POA: Diagnosis not present

## 2020-04-18 DIAGNOSIS — M159 Polyosteoarthritis, unspecified: Secondary | ICD-10-CM | POA: Diagnosis not present

## 2020-05-03 DIAGNOSIS — S93401A Sprain of unspecified ligament of right ankle, initial encounter: Secondary | ICD-10-CM | POA: Diagnosis not present

## 2020-05-14 IMAGING — CR DG CHEST 2V
2 series · 2 of 2 positions shown · non-contrast
Comparison: Radiograph 09/12/2016

CLINICAL DATA: Chest pain, nausea in bedtime shortness of breath

EXAM:
CHEST - 2 VIEW

[chest lat]
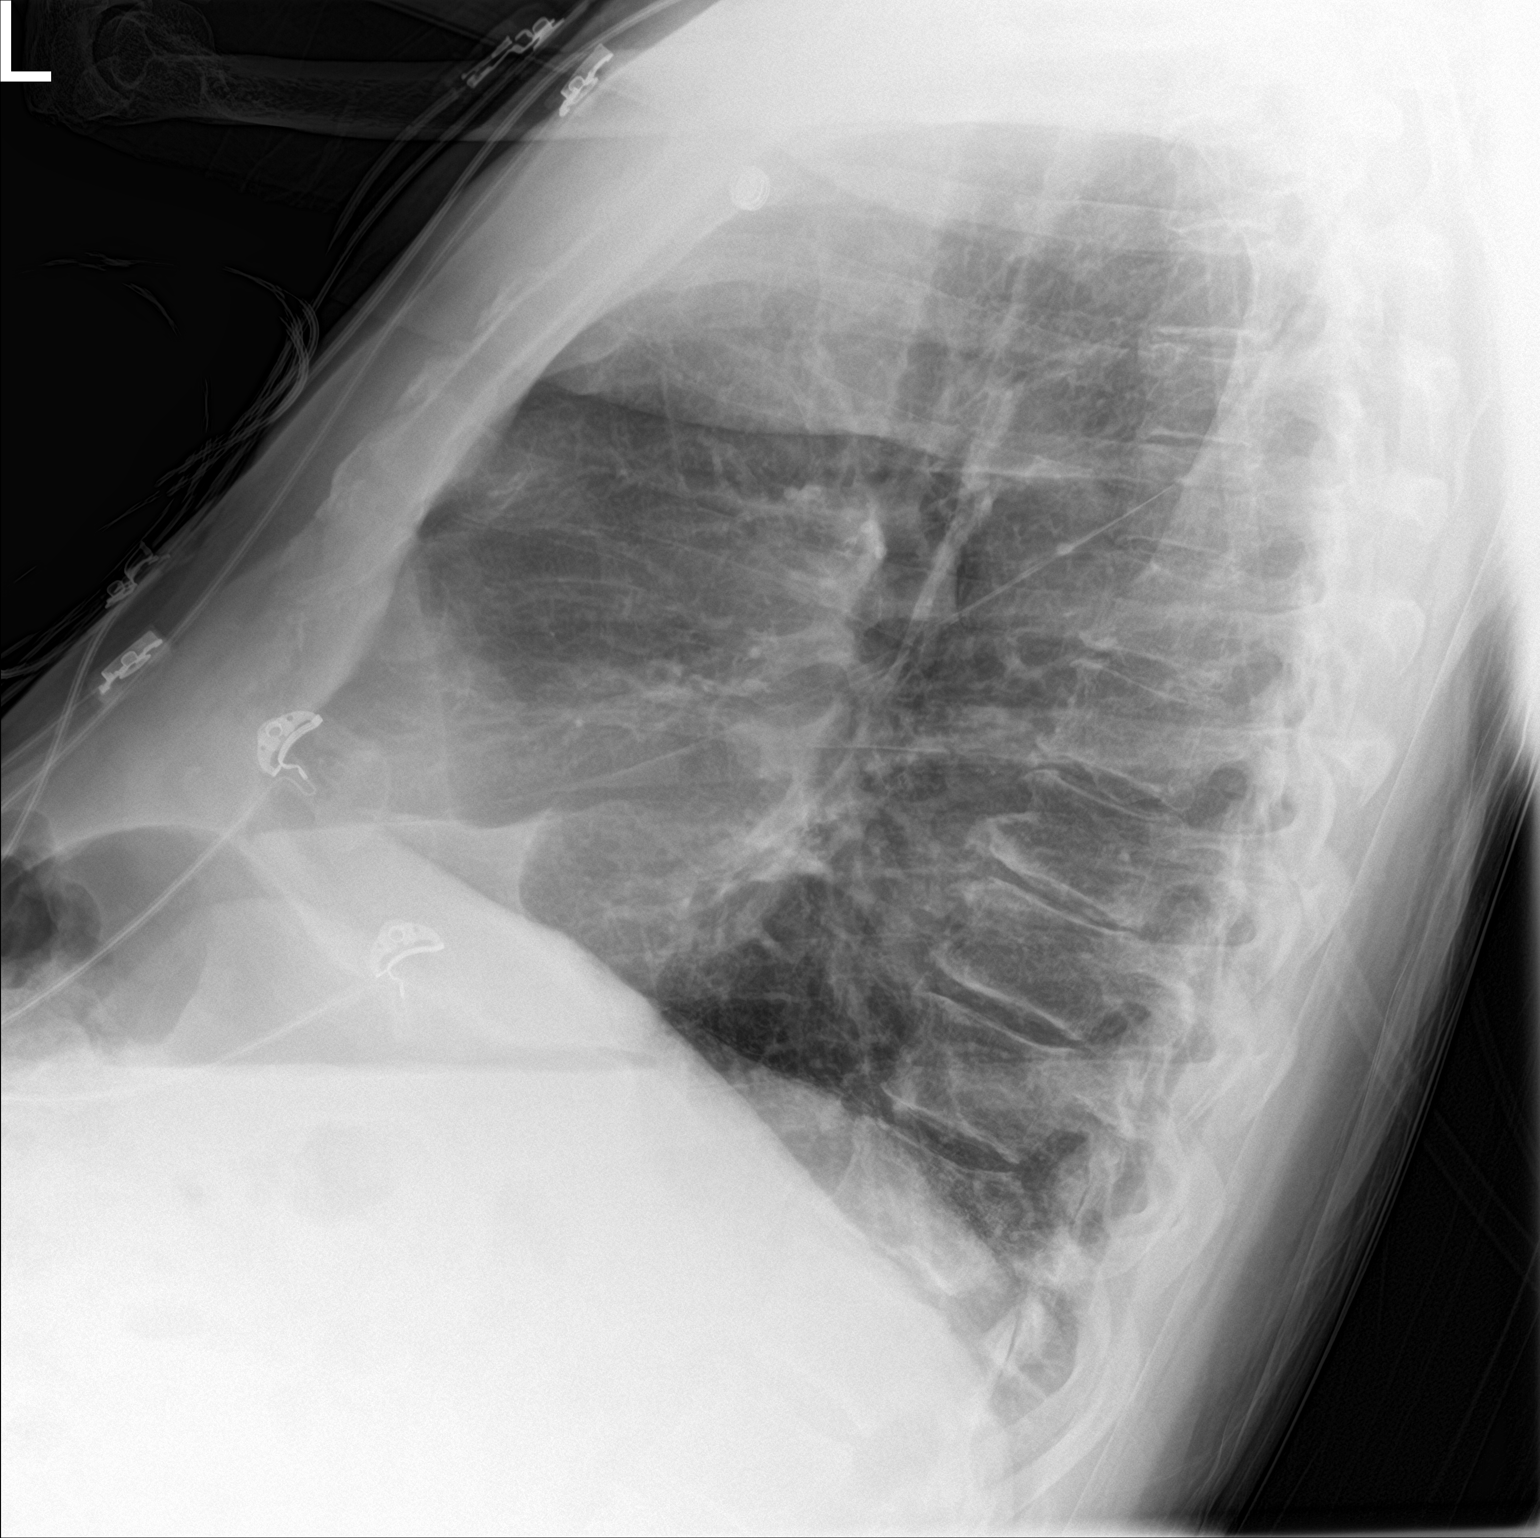

[chest ap]
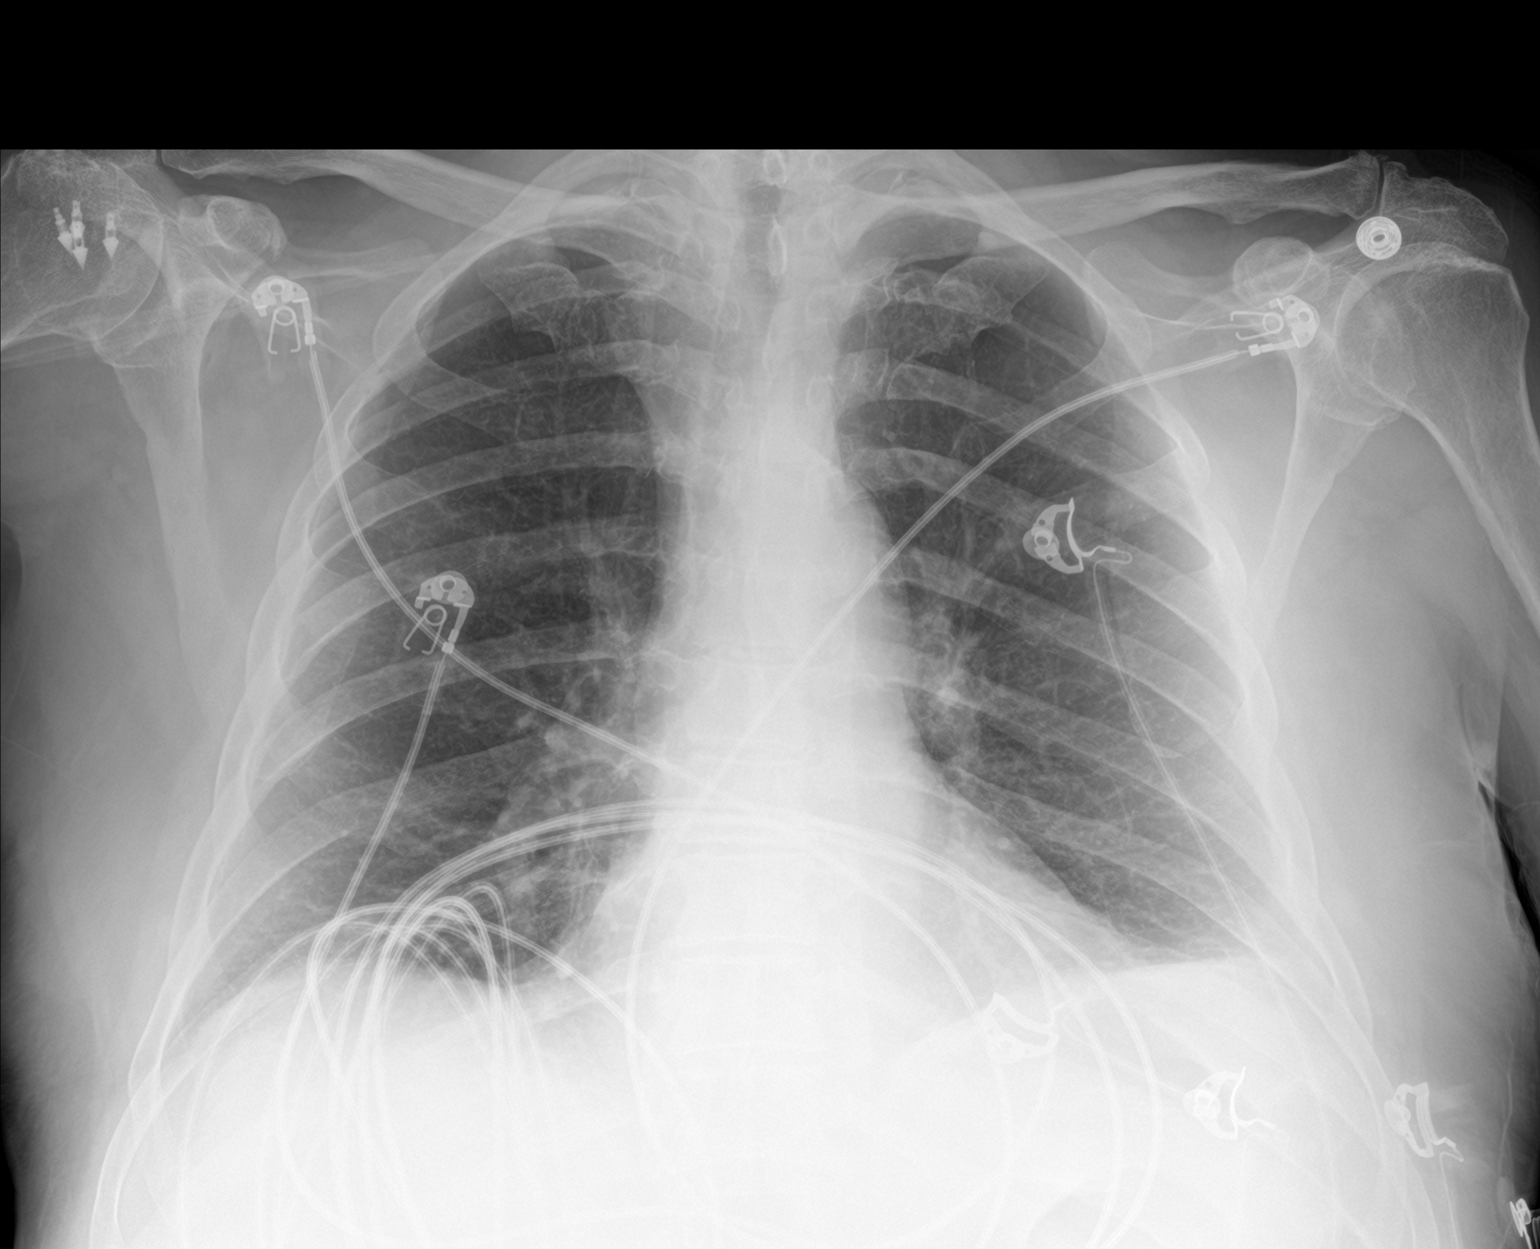

[2 of 2 positions shown; findings below may reference images not displayed]

FINDINGS: Coarse reticular changes in the bases are similar to priors. Lung
volumes are diminished from prior studies. Central venous congestion
without convincing features of frank pulmonary edema. No
consolidation, pneumothorax, or effusion. Pulmonary vascularity is
normally distributed. The cardiomediastinal contours are
unremarkable. No acute osseous or soft tissue abnormality.
Degenerative changes are present in the imaged spine and shoulders.
Surgical anchors are noted in the right humeral head likely from
prior rotator cuff repair.
IMPRESSION: Mild central venous congestion.  No frank pulmonary edema.

Low volumes and atelectasis.

## 2020-05-16 DIAGNOSIS — R600 Localized edema: Secondary | ICD-10-CM | POA: Diagnosis not present

## 2020-05-16 DIAGNOSIS — M545 Low back pain, unspecified: Secondary | ICD-10-CM | POA: Diagnosis not present

## 2020-05-16 DIAGNOSIS — G8929 Other chronic pain: Secondary | ICD-10-CM | POA: Diagnosis not present

## 2020-05-16 DIAGNOSIS — E291 Testicular hypofunction: Secondary | ICD-10-CM | POA: Diagnosis not present

## 2020-05-16 DIAGNOSIS — Z23 Encounter for immunization: Secondary | ICD-10-CM | POA: Diagnosis not present

## 2020-05-16 DIAGNOSIS — N400 Enlarged prostate without lower urinary tract symptoms: Secondary | ICD-10-CM | POA: Diagnosis not present

## 2020-05-16 DIAGNOSIS — Z139 Encounter for screening, unspecified: Secondary | ICD-10-CM | POA: Diagnosis not present

## 2020-05-16 DIAGNOSIS — E118 Type 2 diabetes mellitus with unspecified complications: Secondary | ICD-10-CM | POA: Diagnosis not present

## 2020-05-16 DIAGNOSIS — M659 Synovitis and tenosynovitis, unspecified: Secondary | ICD-10-CM | POA: Diagnosis not present

## 2020-05-16 DIAGNOSIS — E7800 Pure hypercholesterolemia, unspecified: Secondary | ICD-10-CM | POA: Diagnosis not present

## 2020-05-16 DIAGNOSIS — F5104 Psychophysiologic insomnia: Secondary | ICD-10-CM | POA: Diagnosis not present

## 2020-05-16 DIAGNOSIS — G20 Parkinson's disease: Secondary | ICD-10-CM | POA: Diagnosis not present

## 2020-05-30 DIAGNOSIS — R600 Localized edema: Secondary | ICD-10-CM | POA: Diagnosis not present

## 2020-05-30 DIAGNOSIS — E119 Type 2 diabetes mellitus without complications: Secondary | ICD-10-CM | POA: Diagnosis not present

## 2020-05-30 DIAGNOSIS — N400 Enlarged prostate without lower urinary tract symptoms: Secondary | ICD-10-CM | POA: Diagnosis not present

## 2020-05-30 DIAGNOSIS — G8929 Other chronic pain: Secondary | ICD-10-CM | POA: Diagnosis not present

## 2020-05-30 DIAGNOSIS — M545 Low back pain, unspecified: Secondary | ICD-10-CM | POA: Diagnosis not present

## 2020-05-30 DIAGNOSIS — E118 Type 2 diabetes mellitus with unspecified complications: Secondary | ICD-10-CM | POA: Diagnosis not present

## 2020-05-30 DIAGNOSIS — M659 Synovitis and tenosynovitis, unspecified: Secondary | ICD-10-CM | POA: Diagnosis not present

## 2020-05-30 DIAGNOSIS — G20 Parkinson's disease: Secondary | ICD-10-CM | POA: Diagnosis not present

## 2020-05-30 DIAGNOSIS — I2510 Atherosclerotic heart disease of native coronary artery without angina pectoris: Secondary | ICD-10-CM | POA: Diagnosis not present

## 2020-05-30 DIAGNOSIS — E7800 Pure hypercholesterolemia, unspecified: Secondary | ICD-10-CM | POA: Diagnosis not present

## 2020-05-30 DIAGNOSIS — I679 Cerebrovascular disease, unspecified: Secondary | ICD-10-CM | POA: Diagnosis not present

## 2020-05-30 DIAGNOSIS — E291 Testicular hypofunction: Secondary | ICD-10-CM | POA: Diagnosis not present

## 2020-05-30 DIAGNOSIS — F5104 Psychophysiologic insomnia: Secondary | ICD-10-CM | POA: Diagnosis not present

## 2020-06-15 DIAGNOSIS — I10 Essential (primary) hypertension: Secondary | ICD-10-CM | POA: Diagnosis not present

## 2020-06-15 DIAGNOSIS — N400 Enlarged prostate without lower urinary tract symptoms: Secondary | ICD-10-CM | POA: Diagnosis not present

## 2020-06-15 DIAGNOSIS — G8929 Other chronic pain: Secondary | ICD-10-CM | POA: Diagnosis not present

## 2020-06-15 DIAGNOSIS — E1140 Type 2 diabetes mellitus with diabetic neuropathy, unspecified: Secondary | ICD-10-CM | POA: Diagnosis not present

## 2020-06-15 DIAGNOSIS — E291 Testicular hypofunction: Secondary | ICD-10-CM | POA: Diagnosis not present

## 2020-06-15 DIAGNOSIS — G20 Parkinson's disease: Secondary | ICD-10-CM | POA: Diagnosis not present

## 2020-06-15 DIAGNOSIS — K219 Gastro-esophageal reflux disease without esophagitis: Secondary | ICD-10-CM | POA: Diagnosis not present

## 2020-06-15 DIAGNOSIS — I679 Cerebrovascular disease, unspecified: Secondary | ICD-10-CM | POA: Diagnosis not present

## 2020-06-15 DIAGNOSIS — R600 Localized edema: Secondary | ICD-10-CM | POA: Diagnosis not present

## 2020-06-15 DIAGNOSIS — I2510 Atherosclerotic heart disease of native coronary artery without angina pectoris: Secondary | ICD-10-CM | POA: Diagnosis not present

## 2020-06-15 DIAGNOSIS — M545 Low back pain, unspecified: Secondary | ICD-10-CM | POA: Diagnosis not present

## 2020-06-15 DIAGNOSIS — F5104 Psychophysiologic insomnia: Secondary | ICD-10-CM | POA: Diagnosis not present

## 2020-07-13 DIAGNOSIS — I679 Cerebrovascular disease, unspecified: Secondary | ICD-10-CM | POA: Diagnosis not present

## 2020-07-13 DIAGNOSIS — N400 Enlarged prostate without lower urinary tract symptoms: Secondary | ICD-10-CM | POA: Diagnosis not present

## 2020-07-13 DIAGNOSIS — E291 Testicular hypofunction: Secondary | ICD-10-CM | POA: Diagnosis not present

## 2020-07-13 DIAGNOSIS — I10 Essential (primary) hypertension: Secondary | ICD-10-CM | POA: Diagnosis not present

## 2020-07-13 DIAGNOSIS — K219 Gastro-esophageal reflux disease without esophagitis: Secondary | ICD-10-CM | POA: Diagnosis not present

## 2020-07-13 DIAGNOSIS — F5104 Psychophysiologic insomnia: Secondary | ICD-10-CM | POA: Diagnosis not present

## 2020-07-13 DIAGNOSIS — G8929 Other chronic pain: Secondary | ICD-10-CM | POA: Diagnosis not present

## 2020-07-13 DIAGNOSIS — M545 Low back pain, unspecified: Secondary | ICD-10-CM | POA: Diagnosis not present

## 2020-07-13 DIAGNOSIS — I2510 Atherosclerotic heart disease of native coronary artery without angina pectoris: Secondary | ICD-10-CM | POA: Diagnosis not present

## 2020-07-13 DIAGNOSIS — G20 Parkinson's disease: Secondary | ICD-10-CM | POA: Diagnosis not present

## 2020-07-13 DIAGNOSIS — R600 Localized edema: Secondary | ICD-10-CM | POA: Diagnosis not present

## 2020-07-13 DIAGNOSIS — E669 Obesity, unspecified: Secondary | ICD-10-CM | POA: Diagnosis not present

## 2020-08-08 DIAGNOSIS — R269 Unspecified abnormalities of gait and mobility: Secondary | ICD-10-CM | POA: Diagnosis not present

## 2020-08-08 DIAGNOSIS — G20 Parkinson's disease: Secondary | ICD-10-CM | POA: Diagnosis not present

## 2020-08-10 DIAGNOSIS — N400 Enlarged prostate without lower urinary tract symptoms: Secondary | ICD-10-CM | POA: Diagnosis not present

## 2020-08-10 DIAGNOSIS — F5104 Psychophysiologic insomnia: Secondary | ICD-10-CM | POA: Diagnosis not present

## 2020-08-10 DIAGNOSIS — E1121 Type 2 diabetes mellitus with diabetic nephropathy: Secondary | ICD-10-CM | POA: Diagnosis not present

## 2020-08-10 DIAGNOSIS — I679 Cerebrovascular disease, unspecified: Secondary | ICD-10-CM | POA: Diagnosis not present

## 2020-08-10 DIAGNOSIS — M545 Low back pain, unspecified: Secondary | ICD-10-CM | POA: Diagnosis not present

## 2020-08-10 DIAGNOSIS — G8929 Other chronic pain: Secondary | ICD-10-CM | POA: Diagnosis not present

## 2020-08-10 DIAGNOSIS — E291 Testicular hypofunction: Secondary | ICD-10-CM | POA: Diagnosis not present

## 2020-08-10 DIAGNOSIS — F419 Anxiety disorder, unspecified: Secondary | ICD-10-CM | POA: Diagnosis not present

## 2020-08-10 DIAGNOSIS — R600 Localized edema: Secondary | ICD-10-CM | POA: Diagnosis not present

## 2020-08-10 DIAGNOSIS — I2510 Atherosclerotic heart disease of native coronary artery without angina pectoris: Secondary | ICD-10-CM | POA: Diagnosis not present

## 2020-08-10 DIAGNOSIS — G20 Parkinson's disease: Secondary | ICD-10-CM | POA: Diagnosis not present

## 2020-08-10 DIAGNOSIS — Z79899 Other long term (current) drug therapy: Secondary | ICD-10-CM | POA: Diagnosis not present

## 2020-08-13 DIAGNOSIS — M62561 Muscle wasting and atrophy, not elsewhere classified, right lower leg: Secondary | ICD-10-CM | POA: Diagnosis not present

## 2020-08-13 DIAGNOSIS — G20 Parkinson's disease: Secondary | ICD-10-CM | POA: Diagnosis not present

## 2020-08-13 DIAGNOSIS — M62571 Muscle wasting and atrophy, not elsewhere classified, right ankle and foot: Secondary | ICD-10-CM | POA: Diagnosis not present

## 2020-08-13 DIAGNOSIS — M62551 Muscle wasting and atrophy, not elsewhere classified, right thigh: Secondary | ICD-10-CM | POA: Diagnosis not present

## 2020-08-13 DIAGNOSIS — R269 Unspecified abnormalities of gait and mobility: Secondary | ICD-10-CM | POA: Diagnosis not present

## 2020-08-17 DIAGNOSIS — R269 Unspecified abnormalities of gait and mobility: Secondary | ICD-10-CM | POA: Diagnosis not present

## 2020-08-17 DIAGNOSIS — M62571 Muscle wasting and atrophy, not elsewhere classified, right ankle and foot: Secondary | ICD-10-CM | POA: Diagnosis not present

## 2020-08-17 DIAGNOSIS — M62551 Muscle wasting and atrophy, not elsewhere classified, right thigh: Secondary | ICD-10-CM | POA: Diagnosis not present

## 2020-08-17 DIAGNOSIS — G20 Parkinson's disease: Secondary | ICD-10-CM | POA: Diagnosis not present

## 2020-08-17 DIAGNOSIS — M62561 Muscle wasting and atrophy, not elsewhere classified, right lower leg: Secondary | ICD-10-CM | POA: Diagnosis not present

## 2020-08-24 DIAGNOSIS — G20 Parkinson's disease: Secondary | ICD-10-CM | POA: Diagnosis not present

## 2020-08-24 DIAGNOSIS — M62571 Muscle wasting and atrophy, not elsewhere classified, right ankle and foot: Secondary | ICD-10-CM | POA: Diagnosis not present

## 2020-08-24 DIAGNOSIS — M62551 Muscle wasting and atrophy, not elsewhere classified, right thigh: Secondary | ICD-10-CM | POA: Diagnosis not present

## 2020-08-24 DIAGNOSIS — M62561 Muscle wasting and atrophy, not elsewhere classified, right lower leg: Secondary | ICD-10-CM | POA: Diagnosis not present

## 2020-08-24 DIAGNOSIS — R269 Unspecified abnormalities of gait and mobility: Secondary | ICD-10-CM | POA: Diagnosis not present

## 2020-08-27 DIAGNOSIS — M62571 Muscle wasting and atrophy, not elsewhere classified, right ankle and foot: Secondary | ICD-10-CM | POA: Diagnosis not present

## 2020-08-27 DIAGNOSIS — G20 Parkinson's disease: Secondary | ICD-10-CM | POA: Diagnosis not present

## 2020-08-27 DIAGNOSIS — M62551 Muscle wasting and atrophy, not elsewhere classified, right thigh: Secondary | ICD-10-CM | POA: Diagnosis not present

## 2020-08-27 DIAGNOSIS — R269 Unspecified abnormalities of gait and mobility: Secondary | ICD-10-CM | POA: Diagnosis not present

## 2020-08-27 DIAGNOSIS — M62561 Muscle wasting and atrophy, not elsewhere classified, right lower leg: Secondary | ICD-10-CM | POA: Diagnosis not present

## 2020-08-31 DIAGNOSIS — N400 Enlarged prostate without lower urinary tract symptoms: Secondary | ICD-10-CM | POA: Diagnosis not present

## 2020-08-31 DIAGNOSIS — F5104 Psychophysiologic insomnia: Secondary | ICD-10-CM | POA: Diagnosis not present

## 2020-08-31 DIAGNOSIS — I679 Cerebrovascular disease, unspecified: Secondary | ICD-10-CM | POA: Diagnosis not present

## 2020-08-31 DIAGNOSIS — K219 Gastro-esophageal reflux disease without esophagitis: Secondary | ICD-10-CM | POA: Diagnosis not present

## 2020-08-31 DIAGNOSIS — M659 Synovitis and tenosynovitis, unspecified: Secondary | ICD-10-CM | POA: Diagnosis not present

## 2020-08-31 DIAGNOSIS — G20 Parkinson's disease: Secondary | ICD-10-CM | POA: Diagnosis not present

## 2020-08-31 DIAGNOSIS — E291 Testicular hypofunction: Secondary | ICD-10-CM | POA: Diagnosis not present

## 2020-08-31 DIAGNOSIS — G8929 Other chronic pain: Secondary | ICD-10-CM | POA: Diagnosis not present

## 2020-08-31 DIAGNOSIS — F419 Anxiety disorder, unspecified: Secondary | ICD-10-CM | POA: Diagnosis not present

## 2020-08-31 DIAGNOSIS — M545 Low back pain, unspecified: Secondary | ICD-10-CM | POA: Diagnosis not present

## 2020-08-31 DIAGNOSIS — I2510 Atherosclerotic heart disease of native coronary artery without angina pectoris: Secondary | ICD-10-CM | POA: Diagnosis not present

## 2020-08-31 DIAGNOSIS — R600 Localized edema: Secondary | ICD-10-CM | POA: Diagnosis not present

## 2020-09-03 DIAGNOSIS — B9689 Other specified bacterial agents as the cause of diseases classified elsewhere: Secondary | ICD-10-CM | POA: Diagnosis not present

## 2020-09-03 DIAGNOSIS — F419 Anxiety disorder, unspecified: Secondary | ICD-10-CM | POA: Diagnosis not present

## 2020-09-03 DIAGNOSIS — J028 Acute pharyngitis due to other specified organisms: Secondary | ICD-10-CM | POA: Diagnosis not present

## 2020-09-03 DIAGNOSIS — G20 Parkinson's disease: Secondary | ICD-10-CM | POA: Diagnosis not present

## 2020-09-03 DIAGNOSIS — F5104 Psychophysiologic insomnia: Secondary | ICD-10-CM | POA: Diagnosis not present

## 2020-09-03 DIAGNOSIS — I679 Cerebrovascular disease, unspecified: Secondary | ICD-10-CM | POA: Diagnosis not present

## 2020-09-03 DIAGNOSIS — N400 Enlarged prostate without lower urinary tract symptoms: Secondary | ICD-10-CM | POA: Diagnosis not present

## 2020-09-03 DIAGNOSIS — E291 Testicular hypofunction: Secondary | ICD-10-CM | POA: Diagnosis not present

## 2020-09-03 DIAGNOSIS — M545 Low back pain, unspecified: Secondary | ICD-10-CM | POA: Diagnosis not present

## 2020-09-03 DIAGNOSIS — G8929 Other chronic pain: Secondary | ICD-10-CM | POA: Diagnosis not present

## 2020-09-03 DIAGNOSIS — R600 Localized edema: Secondary | ICD-10-CM | POA: Diagnosis not present

## 2020-09-03 DIAGNOSIS — I2510 Atherosclerotic heart disease of native coronary artery without angina pectoris: Secondary | ICD-10-CM | POA: Diagnosis not present

## 2020-09-11 DIAGNOSIS — S93401A Sprain of unspecified ligament of right ankle, initial encounter: Secondary | ICD-10-CM | POA: Diagnosis not present

## 2020-09-14 DIAGNOSIS — S93409A Sprain of unspecified ligament of unspecified ankle, initial encounter: Secondary | ICD-10-CM | POA: Diagnosis not present

## 2020-09-14 DIAGNOSIS — M19071 Primary osteoarthritis, right ankle and foot: Secondary | ICD-10-CM | POA: Diagnosis not present

## 2020-09-14 DIAGNOSIS — M62571 Muscle wasting and atrophy, not elsewhere classified, right ankle and foot: Secondary | ICD-10-CM | POA: Diagnosis not present

## 2020-09-14 DIAGNOSIS — M62561 Muscle wasting and atrophy, not elsewhere classified, right lower leg: Secondary | ICD-10-CM | POA: Diagnosis not present

## 2020-09-14 DIAGNOSIS — M62551 Muscle wasting and atrophy, not elsewhere classified, right thigh: Secondary | ICD-10-CM | POA: Diagnosis not present

## 2020-09-14 DIAGNOSIS — G20 Parkinson's disease: Secondary | ICD-10-CM | POA: Diagnosis not present

## 2020-09-14 DIAGNOSIS — R2689 Other abnormalities of gait and mobility: Secondary | ICD-10-CM | POA: Diagnosis not present

## 2020-09-17 DIAGNOSIS — M19071 Primary osteoarthritis, right ankle and foot: Secondary | ICD-10-CM | POA: Diagnosis not present

## 2020-09-17 DIAGNOSIS — S93409A Sprain of unspecified ligament of unspecified ankle, initial encounter: Secondary | ICD-10-CM | POA: Diagnosis not present

## 2020-09-21 DIAGNOSIS — N400 Enlarged prostate without lower urinary tract symptoms: Secondary | ICD-10-CM | POA: Diagnosis not present

## 2020-09-21 DIAGNOSIS — I2510 Atherosclerotic heart disease of native coronary artery without angina pectoris: Secondary | ICD-10-CM | POA: Diagnosis not present

## 2020-09-21 DIAGNOSIS — J208 Acute bronchitis due to other specified organisms: Secondary | ICD-10-CM | POA: Diagnosis not present

## 2020-09-21 DIAGNOSIS — F5104 Psychophysiologic insomnia: Secondary | ICD-10-CM | POA: Diagnosis not present

## 2020-09-21 DIAGNOSIS — B9689 Other specified bacterial agents as the cause of diseases classified elsewhere: Secondary | ICD-10-CM | POA: Diagnosis not present

## 2020-09-21 DIAGNOSIS — I679 Cerebrovascular disease, unspecified: Secondary | ICD-10-CM | POA: Diagnosis not present

## 2020-09-21 DIAGNOSIS — F419 Anxiety disorder, unspecified: Secondary | ICD-10-CM | POA: Diagnosis not present

## 2020-09-21 DIAGNOSIS — G20 Parkinson's disease: Secondary | ICD-10-CM | POA: Diagnosis not present

## 2020-09-21 DIAGNOSIS — E291 Testicular hypofunction: Secondary | ICD-10-CM | POA: Diagnosis not present

## 2020-09-21 DIAGNOSIS — G8929 Other chronic pain: Secondary | ICD-10-CM | POA: Diagnosis not present

## 2020-09-21 DIAGNOSIS — M545 Low back pain, unspecified: Secondary | ICD-10-CM | POA: Diagnosis not present

## 2020-09-21 DIAGNOSIS — R600 Localized edema: Secondary | ICD-10-CM | POA: Diagnosis not present

## 2020-09-24 DIAGNOSIS — I10 Essential (primary) hypertension: Secondary | ICD-10-CM | POA: Diagnosis not present

## 2020-09-24 DIAGNOSIS — Z20822 Contact with and (suspected) exposure to covid-19: Secondary | ICD-10-CM | POA: Diagnosis not present

## 2020-09-24 DIAGNOSIS — J069 Acute upper respiratory infection, unspecified: Secondary | ICD-10-CM | POA: Diagnosis not present

## 2020-09-24 DIAGNOSIS — Z6829 Body mass index (BMI) 29.0-29.9, adult: Secondary | ICD-10-CM | POA: Diagnosis not present

## 2020-09-24 DIAGNOSIS — E1121 Type 2 diabetes mellitus with diabetic nephropathy: Secondary | ICD-10-CM | POA: Diagnosis not present

## 2020-09-28 DIAGNOSIS — M545 Low back pain, unspecified: Secondary | ICD-10-CM | POA: Diagnosis not present

## 2020-09-28 DIAGNOSIS — F5104 Psychophysiologic insomnia: Secondary | ICD-10-CM | POA: Diagnosis not present

## 2020-09-28 DIAGNOSIS — E291 Testicular hypofunction: Secondary | ICD-10-CM | POA: Diagnosis not present

## 2020-09-28 DIAGNOSIS — F419 Anxiety disorder, unspecified: Secondary | ICD-10-CM | POA: Diagnosis not present

## 2020-09-28 DIAGNOSIS — K219 Gastro-esophageal reflux disease without esophagitis: Secondary | ICD-10-CM | POA: Diagnosis not present

## 2020-09-28 DIAGNOSIS — R600 Localized edema: Secondary | ICD-10-CM | POA: Diagnosis not present

## 2020-09-28 DIAGNOSIS — I679 Cerebrovascular disease, unspecified: Secondary | ICD-10-CM | POA: Diagnosis not present

## 2020-09-28 DIAGNOSIS — G20 Parkinson's disease: Secondary | ICD-10-CM | POA: Diagnosis not present

## 2020-09-28 DIAGNOSIS — G8929 Other chronic pain: Secondary | ICD-10-CM | POA: Diagnosis not present

## 2020-09-28 DIAGNOSIS — N400 Enlarged prostate without lower urinary tract symptoms: Secondary | ICD-10-CM | POA: Diagnosis not present

## 2020-09-28 DIAGNOSIS — I2510 Atherosclerotic heart disease of native coronary artery without angina pectoris: Secondary | ICD-10-CM | POA: Diagnosis not present

## 2020-09-28 DIAGNOSIS — E1140 Type 2 diabetes mellitus with diabetic neuropathy, unspecified: Secondary | ICD-10-CM | POA: Diagnosis not present

## 2020-10-30 DIAGNOSIS — I10 Essential (primary) hypertension: Secondary | ICD-10-CM | POA: Diagnosis not present

## 2020-10-30 DIAGNOSIS — I679 Cerebrovascular disease, unspecified: Secondary | ICD-10-CM | POA: Diagnosis not present

## 2020-10-30 DIAGNOSIS — G8929 Other chronic pain: Secondary | ICD-10-CM | POA: Diagnosis not present

## 2020-10-30 DIAGNOSIS — F419 Anxiety disorder, unspecified: Secondary | ICD-10-CM | POA: Diagnosis not present

## 2020-10-30 DIAGNOSIS — M545 Low back pain, unspecified: Secondary | ICD-10-CM | POA: Diagnosis not present

## 2020-10-30 DIAGNOSIS — N400 Enlarged prostate without lower urinary tract symptoms: Secondary | ICD-10-CM | POA: Diagnosis not present

## 2020-10-30 DIAGNOSIS — R600 Localized edema: Secondary | ICD-10-CM | POA: Diagnosis not present

## 2020-10-30 DIAGNOSIS — I2510 Atherosclerotic heart disease of native coronary artery without angina pectoris: Secondary | ICD-10-CM | POA: Diagnosis not present

## 2020-10-30 DIAGNOSIS — G20 Parkinson's disease: Secondary | ICD-10-CM | POA: Diagnosis not present

## 2020-10-30 DIAGNOSIS — E7800 Pure hypercholesterolemia, unspecified: Secondary | ICD-10-CM | POA: Diagnosis not present

## 2020-10-30 DIAGNOSIS — E1121 Type 2 diabetes mellitus with diabetic nephropathy: Secondary | ICD-10-CM | POA: Diagnosis not present

## 2020-10-30 DIAGNOSIS — F5104 Psychophysiologic insomnia: Secondary | ICD-10-CM | POA: Diagnosis not present

## 2020-10-30 DIAGNOSIS — E291 Testicular hypofunction: Secondary | ICD-10-CM | POA: Diagnosis not present

## 2020-11-27 DIAGNOSIS — G20 Parkinson's disease: Secondary | ICD-10-CM | POA: Diagnosis not present

## 2020-11-27 DIAGNOSIS — M545 Low back pain, unspecified: Secondary | ICD-10-CM | POA: Diagnosis not present

## 2020-11-27 DIAGNOSIS — E1121 Type 2 diabetes mellitus with diabetic nephropathy: Secondary | ICD-10-CM | POA: Diagnosis not present

## 2020-11-27 DIAGNOSIS — E291 Testicular hypofunction: Secondary | ICD-10-CM | POA: Diagnosis not present

## 2020-11-27 DIAGNOSIS — F5104 Psychophysiologic insomnia: Secondary | ICD-10-CM | POA: Diagnosis not present

## 2020-11-27 DIAGNOSIS — G8929 Other chronic pain: Secondary | ICD-10-CM | POA: Diagnosis not present

## 2020-11-27 DIAGNOSIS — E7800 Pure hypercholesterolemia, unspecified: Secondary | ICD-10-CM | POA: Diagnosis not present

## 2020-11-27 DIAGNOSIS — I10 Essential (primary) hypertension: Secondary | ICD-10-CM | POA: Diagnosis not present

## 2020-11-27 DIAGNOSIS — F419 Anxiety disorder, unspecified: Secondary | ICD-10-CM | POA: Diagnosis not present

## 2020-11-27 DIAGNOSIS — R600 Localized edema: Secondary | ICD-10-CM | POA: Diagnosis not present

## 2020-11-27 DIAGNOSIS — N400 Enlarged prostate without lower urinary tract symptoms: Secondary | ICD-10-CM | POA: Diagnosis not present

## 2020-11-27 DIAGNOSIS — I679 Cerebrovascular disease, unspecified: Secondary | ICD-10-CM | POA: Diagnosis not present

## 2020-12-06 DIAGNOSIS — R269 Unspecified abnormalities of gait and mobility: Secondary | ICD-10-CM | POA: Diagnosis not present

## 2020-12-06 DIAGNOSIS — G20 Parkinson's disease: Secondary | ICD-10-CM | POA: Diagnosis not present

## 2020-12-11 DIAGNOSIS — E7800 Pure hypercholesterolemia, unspecified: Secondary | ICD-10-CM | POA: Diagnosis not present

## 2020-12-11 DIAGNOSIS — N400 Enlarged prostate without lower urinary tract symptoms: Secondary | ICD-10-CM | POA: Diagnosis not present

## 2020-12-11 DIAGNOSIS — F5104 Psychophysiologic insomnia: Secondary | ICD-10-CM | POA: Diagnosis not present

## 2020-12-11 DIAGNOSIS — I2510 Atherosclerotic heart disease of native coronary artery without angina pectoris: Secondary | ICD-10-CM | POA: Diagnosis not present

## 2020-12-11 DIAGNOSIS — I679 Cerebrovascular disease, unspecified: Secondary | ICD-10-CM | POA: Diagnosis not present

## 2020-12-11 DIAGNOSIS — E291 Testicular hypofunction: Secondary | ICD-10-CM | POA: Diagnosis not present

## 2020-12-11 DIAGNOSIS — R600 Localized edema: Secondary | ICD-10-CM | POA: Diagnosis not present

## 2020-12-11 DIAGNOSIS — I10 Essential (primary) hypertension: Secondary | ICD-10-CM | POA: Diagnosis not present

## 2020-12-11 DIAGNOSIS — M545 Low back pain, unspecified: Secondary | ICD-10-CM | POA: Diagnosis not present

## 2020-12-11 DIAGNOSIS — K219 Gastro-esophageal reflux disease without esophagitis: Secondary | ICD-10-CM | POA: Diagnosis not present

## 2020-12-11 DIAGNOSIS — G20 Parkinson's disease: Secondary | ICD-10-CM | POA: Diagnosis not present

## 2020-12-11 DIAGNOSIS — G8929 Other chronic pain: Secondary | ICD-10-CM | POA: Diagnosis not present

## 2020-12-25 DIAGNOSIS — E7800 Pure hypercholesterolemia, unspecified: Secondary | ICD-10-CM | POA: Diagnosis not present

## 2020-12-25 DIAGNOSIS — N400 Enlarged prostate without lower urinary tract symptoms: Secondary | ICD-10-CM | POA: Diagnosis not present

## 2020-12-25 DIAGNOSIS — G20 Parkinson's disease: Secondary | ICD-10-CM | POA: Diagnosis not present

## 2020-12-25 DIAGNOSIS — R600 Localized edema: Secondary | ICD-10-CM | POA: Diagnosis not present

## 2020-12-25 DIAGNOSIS — F5104 Psychophysiologic insomnia: Secondary | ICD-10-CM | POA: Diagnosis not present

## 2020-12-25 DIAGNOSIS — M25512 Pain in left shoulder: Secondary | ICD-10-CM | POA: Diagnosis not present

## 2020-12-25 DIAGNOSIS — E291 Testicular hypofunction: Secondary | ICD-10-CM | POA: Diagnosis not present

## 2020-12-25 DIAGNOSIS — I2510 Atherosclerotic heart disease of native coronary artery without angina pectoris: Secondary | ICD-10-CM | POA: Diagnosis not present

## 2020-12-25 DIAGNOSIS — M545 Low back pain, unspecified: Secondary | ICD-10-CM | POA: Diagnosis not present

## 2020-12-25 DIAGNOSIS — I679 Cerebrovascular disease, unspecified: Secondary | ICD-10-CM | POA: Diagnosis not present

## 2020-12-25 DIAGNOSIS — G8929 Other chronic pain: Secondary | ICD-10-CM | POA: Diagnosis not present

## 2020-12-25 DIAGNOSIS — I10 Essential (primary) hypertension: Secondary | ICD-10-CM | POA: Diagnosis not present

## 2021-01-29 DIAGNOSIS — R519 Headache, unspecified: Secondary | ICD-10-CM | POA: Diagnosis not present

## 2021-01-29 DIAGNOSIS — Z9181 History of falling: Secondary | ICD-10-CM | POA: Diagnosis not present

## 2021-01-29 DIAGNOSIS — G4489 Other headache syndrome: Secondary | ICD-10-CM | POA: Diagnosis not present

## 2021-01-29 DIAGNOSIS — M19012 Primary osteoarthritis, left shoulder: Secondary | ICD-10-CM | POA: Diagnosis not present

## 2021-01-29 DIAGNOSIS — I10 Essential (primary) hypertension: Secondary | ICD-10-CM | POA: Diagnosis not present

## 2021-01-29 DIAGNOSIS — I361 Nonrheumatic tricuspid (valve) insufficiency: Secondary | ICD-10-CM | POA: Diagnosis not present

## 2021-01-29 DIAGNOSIS — Z87891 Personal history of nicotine dependence: Secondary | ICD-10-CM | POA: Diagnosis not present

## 2021-01-29 DIAGNOSIS — I314 Cardiac tamponade: Secondary | ICD-10-CM | POA: Diagnosis not present

## 2021-01-29 DIAGNOSIS — G20 Parkinson's disease: Secondary | ICD-10-CM | POA: Diagnosis not present

## 2021-01-29 DIAGNOSIS — R079 Chest pain, unspecified: Secondary | ICD-10-CM | POA: Diagnosis not present

## 2021-01-29 DIAGNOSIS — Z79899 Other long term (current) drug therapy: Secondary | ICD-10-CM | POA: Diagnosis not present

## 2021-01-29 DIAGNOSIS — Z7982 Long term (current) use of aspirin: Secondary | ICD-10-CM | POA: Diagnosis not present

## 2021-01-29 DIAGNOSIS — I2510 Atherosclerotic heart disease of native coronary artery without angina pectoris: Secondary | ICD-10-CM | POA: Diagnosis not present

## 2021-01-29 DIAGNOSIS — R0789 Other chest pain: Secondary | ICD-10-CM | POA: Diagnosis not present

## 2021-01-29 DIAGNOSIS — R61 Generalized hyperhidrosis: Secondary | ICD-10-CM | POA: Diagnosis not present

## 2021-01-29 DIAGNOSIS — I252 Old myocardial infarction: Secondary | ICD-10-CM | POA: Diagnosis not present

## 2021-01-29 DIAGNOSIS — E119 Type 2 diabetes mellitus without complications: Secondary | ICD-10-CM | POA: Diagnosis not present

## 2021-01-29 DIAGNOSIS — R42 Dizziness and giddiness: Secondary | ICD-10-CM | POA: Diagnosis not present

## 2021-01-29 DIAGNOSIS — Z7902 Long term (current) use of antithrombotics/antiplatelets: Secondary | ICD-10-CM | POA: Diagnosis not present

## 2021-01-29 DIAGNOSIS — M1990 Unspecified osteoarthritis, unspecified site: Secondary | ICD-10-CM | POA: Diagnosis not present

## 2021-01-30 DIAGNOSIS — R079 Chest pain, unspecified: Secondary | ICD-10-CM | POA: Diagnosis not present

## 2021-02-05 DIAGNOSIS — E1121 Type 2 diabetes mellitus with diabetic nephropathy: Secondary | ICD-10-CM | POA: Diagnosis not present

## 2021-02-05 DIAGNOSIS — J309 Allergic rhinitis, unspecified: Secondary | ICD-10-CM | POA: Diagnosis not present

## 2021-02-05 DIAGNOSIS — F419 Anxiety disorder, unspecified: Secondary | ICD-10-CM | POA: Diagnosis not present

## 2021-02-05 DIAGNOSIS — N400 Enlarged prostate without lower urinary tract symptoms: Secondary | ICD-10-CM | POA: Diagnosis not present

## 2021-02-05 DIAGNOSIS — R0789 Other chest pain: Secondary | ICD-10-CM | POA: Diagnosis not present

## 2021-02-05 DIAGNOSIS — E291 Testicular hypofunction: Secondary | ICD-10-CM | POA: Diagnosis not present

## 2021-02-05 DIAGNOSIS — F5104 Psychophysiologic insomnia: Secondary | ICD-10-CM | POA: Diagnosis not present

## 2021-02-05 DIAGNOSIS — I10 Essential (primary) hypertension: Secondary | ICD-10-CM | POA: Diagnosis not present

## 2021-02-05 DIAGNOSIS — E7800 Pure hypercholesterolemia, unspecified: Secondary | ICD-10-CM | POA: Diagnosis not present

## 2021-02-26 DIAGNOSIS — R600 Localized edema: Secondary | ICD-10-CM | POA: Diagnosis not present

## 2021-02-26 DIAGNOSIS — M545 Low back pain, unspecified: Secondary | ICD-10-CM | POA: Diagnosis not present

## 2021-02-26 DIAGNOSIS — I679 Cerebrovascular disease, unspecified: Secondary | ICD-10-CM | POA: Diagnosis not present

## 2021-02-26 DIAGNOSIS — E291 Testicular hypofunction: Secondary | ICD-10-CM | POA: Diagnosis not present

## 2021-02-26 DIAGNOSIS — N400 Enlarged prostate without lower urinary tract symptoms: Secondary | ICD-10-CM | POA: Diagnosis not present

## 2021-02-26 DIAGNOSIS — F5104 Psychophysiologic insomnia: Secondary | ICD-10-CM | POA: Diagnosis not present

## 2021-02-26 DIAGNOSIS — I10 Essential (primary) hypertension: Secondary | ICD-10-CM | POA: Diagnosis not present

## 2021-02-26 DIAGNOSIS — E1121 Type 2 diabetes mellitus with diabetic nephropathy: Secondary | ICD-10-CM | POA: Diagnosis not present

## 2021-02-26 DIAGNOSIS — G8929 Other chronic pain: Secondary | ICD-10-CM | POA: Diagnosis not present

## 2021-02-26 DIAGNOSIS — I2510 Atherosclerotic heart disease of native coronary artery without angina pectoris: Secondary | ICD-10-CM | POA: Diagnosis not present

## 2021-02-26 DIAGNOSIS — G20 Parkinson's disease: Secondary | ICD-10-CM | POA: Diagnosis not present

## 2021-02-26 DIAGNOSIS — K219 Gastro-esophageal reflux disease without esophagitis: Secondary | ICD-10-CM | POA: Diagnosis not present

## 2021-02-26 DIAGNOSIS — E7800 Pure hypercholesterolemia, unspecified: Secondary | ICD-10-CM | POA: Diagnosis not present

## 2021-03-26 DIAGNOSIS — R600 Localized edema: Secondary | ICD-10-CM | POA: Diagnosis not present

## 2021-03-26 DIAGNOSIS — N400 Enlarged prostate without lower urinary tract symptoms: Secondary | ICD-10-CM | POA: Diagnosis not present

## 2021-03-26 DIAGNOSIS — E7800 Pure hypercholesterolemia, unspecified: Secondary | ICD-10-CM | POA: Diagnosis not present

## 2021-03-26 DIAGNOSIS — M545 Low back pain, unspecified: Secondary | ICD-10-CM | POA: Diagnosis not present

## 2021-03-26 DIAGNOSIS — G20 Parkinson's disease: Secondary | ICD-10-CM | POA: Diagnosis not present

## 2021-03-26 DIAGNOSIS — I2510 Atherosclerotic heart disease of native coronary artery without angina pectoris: Secondary | ICD-10-CM | POA: Diagnosis not present

## 2021-03-26 DIAGNOSIS — I679 Cerebrovascular disease, unspecified: Secondary | ICD-10-CM | POA: Diagnosis not present

## 2021-03-26 DIAGNOSIS — F5104 Psychophysiologic insomnia: Secondary | ICD-10-CM | POA: Diagnosis not present

## 2021-03-26 DIAGNOSIS — E291 Testicular hypofunction: Secondary | ICD-10-CM | POA: Diagnosis not present

## 2021-03-26 DIAGNOSIS — G8929 Other chronic pain: Secondary | ICD-10-CM | POA: Diagnosis not present

## 2021-03-26 DIAGNOSIS — E1140 Type 2 diabetes mellitus with diabetic neuropathy, unspecified: Secondary | ICD-10-CM | POA: Diagnosis not present

## 2021-03-26 DIAGNOSIS — K219 Gastro-esophageal reflux disease without esophagitis: Secondary | ICD-10-CM | POA: Diagnosis not present

## 2021-04-11 DIAGNOSIS — G20 Parkinson's disease: Secondary | ICD-10-CM | POA: Diagnosis not present

## 2021-04-24 DIAGNOSIS — N400 Enlarged prostate without lower urinary tract symptoms: Secondary | ICD-10-CM | POA: Diagnosis not present

## 2021-04-24 DIAGNOSIS — E1140 Type 2 diabetes mellitus with diabetic neuropathy, unspecified: Secondary | ICD-10-CM | POA: Diagnosis not present

## 2021-04-24 DIAGNOSIS — K219 Gastro-esophageal reflux disease without esophagitis: Secondary | ICD-10-CM | POA: Diagnosis not present

## 2021-04-24 DIAGNOSIS — E7800 Pure hypercholesterolemia, unspecified: Secondary | ICD-10-CM | POA: Diagnosis not present

## 2021-04-24 DIAGNOSIS — I679 Cerebrovascular disease, unspecified: Secondary | ICD-10-CM | POA: Diagnosis not present

## 2021-04-24 DIAGNOSIS — E291 Testicular hypofunction: Secondary | ICD-10-CM | POA: Diagnosis not present

## 2021-04-24 DIAGNOSIS — R600 Localized edema: Secondary | ICD-10-CM | POA: Diagnosis not present

## 2021-04-24 DIAGNOSIS — F5104 Psychophysiologic insomnia: Secondary | ICD-10-CM | POA: Diagnosis not present

## 2021-04-24 DIAGNOSIS — M545 Low back pain, unspecified: Secondary | ICD-10-CM | POA: Diagnosis not present

## 2021-04-24 DIAGNOSIS — G20 Parkinson's disease: Secondary | ICD-10-CM | POA: Diagnosis not present

## 2021-04-24 DIAGNOSIS — G8929 Other chronic pain: Secondary | ICD-10-CM | POA: Diagnosis not present

## 2021-04-24 DIAGNOSIS — I2510 Atherosclerotic heart disease of native coronary artery without angina pectoris: Secondary | ICD-10-CM | POA: Diagnosis not present

## 2021-05-17 DIAGNOSIS — M533 Sacrococcygeal disorders, not elsewhere classified: Secondary | ICD-10-CM | POA: Diagnosis not present

## 2021-05-17 DIAGNOSIS — Z981 Arthrodesis status: Secondary | ICD-10-CM | POA: Diagnosis not present

## 2021-05-17 DIAGNOSIS — M5416 Radiculopathy, lumbar region: Secondary | ICD-10-CM | POA: Diagnosis not present

## 2021-05-27 DIAGNOSIS — Z6828 Body mass index (BMI) 28.0-28.9, adult: Secondary | ICD-10-CM | POA: Diagnosis not present

## 2021-05-27 DIAGNOSIS — M545 Low back pain, unspecified: Secondary | ICD-10-CM | POA: Diagnosis not present

## 2021-05-27 DIAGNOSIS — E291 Testicular hypofunction: Secondary | ICD-10-CM | POA: Diagnosis not present

## 2021-05-27 DIAGNOSIS — N400 Enlarged prostate without lower urinary tract symptoms: Secondary | ICD-10-CM | POA: Diagnosis not present

## 2021-05-27 DIAGNOSIS — R600 Localized edema: Secondary | ICD-10-CM | POA: Diagnosis not present

## 2021-05-27 DIAGNOSIS — G20 Parkinson's disease: Secondary | ICD-10-CM | POA: Diagnosis not present

## 2021-05-27 DIAGNOSIS — I679 Cerebrovascular disease, unspecified: Secondary | ICD-10-CM | POA: Diagnosis not present

## 2021-05-27 DIAGNOSIS — F5104 Psychophysiologic insomnia: Secondary | ICD-10-CM | POA: Diagnosis not present

## 2021-05-27 DIAGNOSIS — E7800 Pure hypercholesterolemia, unspecified: Secondary | ICD-10-CM | POA: Diagnosis not present

## 2021-05-27 DIAGNOSIS — G8929 Other chronic pain: Secondary | ICD-10-CM | POA: Diagnosis not present

## 2021-06-10 DIAGNOSIS — E119 Type 2 diabetes mellitus without complications: Secondary | ICD-10-CM | POA: Diagnosis not present

## 2021-06-20 DIAGNOSIS — M5416 Radiculopathy, lumbar region: Secondary | ICD-10-CM | POA: Diagnosis not present

## 2021-06-20 DIAGNOSIS — Z6828 Body mass index (BMI) 28.0-28.9, adult: Secondary | ICD-10-CM | POA: Diagnosis not present

## 2021-06-20 DIAGNOSIS — I10 Essential (primary) hypertension: Secondary | ICD-10-CM | POA: Diagnosis not present

## 2021-06-24 ENCOUNTER — Other Ambulatory Visit: Payer: Self-pay | Admitting: Neurosurgery

## 2021-06-24 DIAGNOSIS — F5104 Psychophysiologic insomnia: Secondary | ICD-10-CM | POA: Diagnosis not present

## 2021-06-24 DIAGNOSIS — Z23 Encounter for immunization: Secondary | ICD-10-CM | POA: Diagnosis not present

## 2021-06-24 DIAGNOSIS — Z139 Encounter for screening, unspecified: Secondary | ICD-10-CM | POA: Diagnosis not present

## 2021-06-24 DIAGNOSIS — M159 Polyosteoarthritis, unspecified: Secondary | ICD-10-CM | POA: Diagnosis not present

## 2021-06-24 DIAGNOSIS — M5416 Radiculopathy, lumbar region: Secondary | ICD-10-CM

## 2021-06-24 DIAGNOSIS — N400 Enlarged prostate without lower urinary tract symptoms: Secondary | ICD-10-CM | POA: Diagnosis not present

## 2021-06-24 DIAGNOSIS — G20 Parkinson's disease: Secondary | ICD-10-CM | POA: Diagnosis not present

## 2021-06-24 DIAGNOSIS — I679 Cerebrovascular disease, unspecified: Secondary | ICD-10-CM | POA: Diagnosis not present

## 2021-06-24 DIAGNOSIS — G8929 Other chronic pain: Secondary | ICD-10-CM | POA: Diagnosis not present

## 2021-06-24 DIAGNOSIS — R600 Localized edema: Secondary | ICD-10-CM | POA: Diagnosis not present

## 2021-06-24 DIAGNOSIS — E291 Testicular hypofunction: Secondary | ICD-10-CM | POA: Diagnosis not present

## 2021-06-24 DIAGNOSIS — M545 Low back pain, unspecified: Secondary | ICD-10-CM | POA: Diagnosis not present

## 2021-06-24 DIAGNOSIS — I2510 Atherosclerotic heart disease of native coronary artery without angina pectoris: Secondary | ICD-10-CM | POA: Diagnosis not present

## 2021-07-08 DIAGNOSIS — M159 Polyosteoarthritis, unspecified: Secondary | ICD-10-CM | POA: Diagnosis not present

## 2021-07-08 DIAGNOSIS — M545 Low back pain, unspecified: Secondary | ICD-10-CM | POA: Diagnosis not present

## 2021-07-08 DIAGNOSIS — I679 Cerebrovascular disease, unspecified: Secondary | ICD-10-CM | POA: Diagnosis not present

## 2021-07-08 DIAGNOSIS — K219 Gastro-esophageal reflux disease without esophagitis: Secondary | ICD-10-CM | POA: Diagnosis not present

## 2021-07-08 DIAGNOSIS — R600 Localized edema: Secondary | ICD-10-CM | POA: Diagnosis not present

## 2021-07-08 DIAGNOSIS — I2510 Atherosclerotic heart disease of native coronary artery without angina pectoris: Secondary | ICD-10-CM | POA: Diagnosis not present

## 2021-07-08 DIAGNOSIS — Z139 Encounter for screening, unspecified: Secondary | ICD-10-CM | POA: Diagnosis not present

## 2021-07-08 DIAGNOSIS — G20 Parkinson's disease: Secondary | ICD-10-CM | POA: Diagnosis not present

## 2021-07-08 DIAGNOSIS — E291 Testicular hypofunction: Secondary | ICD-10-CM | POA: Diagnosis not present

## 2021-07-08 DIAGNOSIS — F5104 Psychophysiologic insomnia: Secondary | ICD-10-CM | POA: Diagnosis not present

## 2021-07-08 DIAGNOSIS — Z23 Encounter for immunization: Secondary | ICD-10-CM | POA: Diagnosis not present

## 2021-07-08 DIAGNOSIS — G8929 Other chronic pain: Secondary | ICD-10-CM | POA: Diagnosis not present

## 2021-07-22 DIAGNOSIS — F5104 Psychophysiologic insomnia: Secondary | ICD-10-CM | POA: Diagnosis not present

## 2021-07-22 DIAGNOSIS — I679 Cerebrovascular disease, unspecified: Secondary | ICD-10-CM | POA: Diagnosis not present

## 2021-07-22 DIAGNOSIS — I2510 Atherosclerotic heart disease of native coronary artery without angina pectoris: Secondary | ICD-10-CM | POA: Diagnosis not present

## 2021-07-22 DIAGNOSIS — E291 Testicular hypofunction: Secondary | ICD-10-CM | POA: Diagnosis not present

## 2021-07-22 DIAGNOSIS — G8929 Other chronic pain: Secondary | ICD-10-CM | POA: Diagnosis not present

## 2021-07-22 DIAGNOSIS — G20 Parkinson's disease: Secondary | ICD-10-CM | POA: Diagnosis not present

## 2021-07-22 DIAGNOSIS — E1140 Type 2 diabetes mellitus with diabetic neuropathy, unspecified: Secondary | ICD-10-CM | POA: Diagnosis not present

## 2021-07-22 DIAGNOSIS — R600 Localized edema: Secondary | ICD-10-CM | POA: Diagnosis not present

## 2021-07-22 DIAGNOSIS — M545 Low back pain, unspecified: Secondary | ICD-10-CM | POA: Diagnosis not present

## 2021-07-22 DIAGNOSIS — K219 Gastro-esophageal reflux disease without esophagitis: Secondary | ICD-10-CM | POA: Diagnosis not present

## 2021-07-22 DIAGNOSIS — M159 Polyosteoarthritis, unspecified: Secondary | ICD-10-CM | POA: Diagnosis not present

## 2021-07-22 DIAGNOSIS — F419 Anxiety disorder, unspecified: Secondary | ICD-10-CM | POA: Diagnosis not present

## 2021-08-26 DIAGNOSIS — E291 Testicular hypofunction: Secondary | ICD-10-CM | POA: Diagnosis not present

## 2021-08-26 DIAGNOSIS — G8929 Other chronic pain: Secondary | ICD-10-CM | POA: Diagnosis not present

## 2021-08-26 DIAGNOSIS — I10 Essential (primary) hypertension: Secondary | ICD-10-CM | POA: Diagnosis not present

## 2021-08-26 DIAGNOSIS — E1121 Type 2 diabetes mellitus with diabetic nephropathy: Secondary | ICD-10-CM | POA: Diagnosis not present

## 2021-08-26 DIAGNOSIS — G20 Parkinson's disease: Secondary | ICD-10-CM | POA: Diagnosis not present

## 2021-08-26 DIAGNOSIS — M159 Polyosteoarthritis, unspecified: Secondary | ICD-10-CM | POA: Diagnosis not present

## 2021-08-26 DIAGNOSIS — F5104 Psychophysiologic insomnia: Secondary | ICD-10-CM | POA: Diagnosis not present

## 2021-09-23 DIAGNOSIS — G8929 Other chronic pain: Secondary | ICD-10-CM | POA: Diagnosis not present

## 2021-09-23 DIAGNOSIS — M12812 Other specific arthropathies, not elsewhere classified, left shoulder: Secondary | ICD-10-CM | POA: Diagnosis not present

## 2021-09-23 DIAGNOSIS — E291 Testicular hypofunction: Secondary | ICD-10-CM | POA: Diagnosis not present

## 2021-09-23 DIAGNOSIS — M159 Polyosteoarthritis, unspecified: Secondary | ICD-10-CM | POA: Diagnosis not present

## 2021-09-23 DIAGNOSIS — I679 Cerebrovascular disease, unspecified: Secondary | ICD-10-CM | POA: Diagnosis not present

## 2021-09-23 DIAGNOSIS — R600 Localized edema: Secondary | ICD-10-CM | POA: Diagnosis not present

## 2021-09-23 DIAGNOSIS — K219 Gastro-esophageal reflux disease without esophagitis: Secondary | ICD-10-CM | POA: Diagnosis not present

## 2021-09-23 DIAGNOSIS — Z6828 Body mass index (BMI) 28.0-28.9, adult: Secondary | ICD-10-CM | POA: Diagnosis not present

## 2021-09-23 DIAGNOSIS — I2510 Atherosclerotic heart disease of native coronary artery without angina pectoris: Secondary | ICD-10-CM | POA: Diagnosis not present

## 2021-09-23 DIAGNOSIS — G20 Parkinson's disease: Secondary | ICD-10-CM | POA: Diagnosis not present

## 2021-09-23 DIAGNOSIS — F5104 Psychophysiologic insomnia: Secondary | ICD-10-CM | POA: Diagnosis not present

## 2021-09-23 DIAGNOSIS — M545 Low back pain, unspecified: Secondary | ICD-10-CM | POA: Diagnosis not present

## 2021-10-01 DIAGNOSIS — M12812 Other specific arthropathies, not elsewhere classified, left shoulder: Secondary | ICD-10-CM | POA: Diagnosis not present

## 2021-11-26 DIAGNOSIS — R079 Chest pain, unspecified: Secondary | ICD-10-CM | POA: Diagnosis not present

## 2021-12-09 ENCOUNTER — Other Ambulatory Visit: Payer: Self-pay

## 2021-12-09 DIAGNOSIS — G8929 Other chronic pain: Secondary | ICD-10-CM | POA: Insufficient documentation

## 2021-12-09 DIAGNOSIS — F5104 Psychophysiologic insomnia: Secondary | ICD-10-CM | POA: Insufficient documentation

## 2021-12-09 DIAGNOSIS — E119 Type 2 diabetes mellitus without complications: Secondary | ICD-10-CM | POA: Insufficient documentation

## 2021-12-09 DIAGNOSIS — M549 Dorsalgia, unspecified: Secondary | ICD-10-CM | POA: Insufficient documentation

## 2021-12-09 DIAGNOSIS — E1121 Type 2 diabetes mellitus with diabetic nephropathy: Secondary | ICD-10-CM | POA: Insufficient documentation

## 2021-12-09 DIAGNOSIS — M1990 Unspecified osteoarthritis, unspecified site: Secondary | ICD-10-CM | POA: Insufficient documentation

## 2021-12-09 DIAGNOSIS — N529 Male erectile dysfunction, unspecified: Secondary | ICD-10-CM | POA: Insufficient documentation

## 2021-12-09 DIAGNOSIS — Z87442 Personal history of urinary calculi: Secondary | ICD-10-CM | POA: Insufficient documentation

## 2021-12-09 DIAGNOSIS — K219 Gastro-esophageal reflux disease without esophagitis: Secondary | ICD-10-CM | POA: Insufficient documentation

## 2021-12-09 DIAGNOSIS — I2510 Atherosclerotic heart disease of native coronary artery without angina pectoris: Secondary | ICD-10-CM | POA: Insufficient documentation

## 2021-12-09 DIAGNOSIS — E291 Testicular hypofunction: Secondary | ICD-10-CM | POA: Insufficient documentation

## 2021-12-09 DIAGNOSIS — E7800 Pure hypercholesterolemia, unspecified: Secondary | ICD-10-CM | POA: Insufficient documentation

## 2021-12-09 DIAGNOSIS — E1140 Type 2 diabetes mellitus with diabetic neuropathy, unspecified: Secondary | ICD-10-CM | POA: Insufficient documentation

## 2021-12-09 DIAGNOSIS — I10 Essential (primary) hypertension: Secondary | ICD-10-CM | POA: Insufficient documentation

## 2021-12-09 DIAGNOSIS — R600 Localized edema: Secondary | ICD-10-CM | POA: Insufficient documentation

## 2021-12-09 DIAGNOSIS — N1832 Chronic kidney disease, stage 3b: Secondary | ICD-10-CM | POA: Insufficient documentation

## 2021-12-09 DIAGNOSIS — N400 Enlarged prostate without lower urinary tract symptoms: Secondary | ICD-10-CM | POA: Insufficient documentation

## 2021-12-09 DIAGNOSIS — F419 Anxiety disorder, unspecified: Secondary | ICD-10-CM | POA: Insufficient documentation

## 2021-12-09 DIAGNOSIS — G20 Parkinson's disease: Secondary | ICD-10-CM | POA: Insufficient documentation

## 2021-12-09 DIAGNOSIS — I679 Cerebrovascular disease, unspecified: Secondary | ICD-10-CM | POA: Insufficient documentation

## 2021-12-09 DIAGNOSIS — J309 Allergic rhinitis, unspecified: Secondary | ICD-10-CM | POA: Insufficient documentation

## 2021-12-09 DIAGNOSIS — J189 Pneumonia, unspecified organism: Secondary | ICD-10-CM | POA: Insufficient documentation

## 2021-12-09 DIAGNOSIS — E785 Hyperlipidemia, unspecified: Secondary | ICD-10-CM | POA: Insufficient documentation

## 2021-12-11 ENCOUNTER — Telehealth: Payer: Self-pay

## 2021-12-11 NOTE — Telephone Encounter (Signed)
? ?  [facility] Pre-operative Risk Assessment  ?  ?Request for surgical clearance: ? ?What type of surgery is being performed? Left shoulder arthroscopics rotator cuff repair with possible ballon spacer   ? ?When is this surgery scheduled? TBD  ? ?What type of clearance is required (medical clearance vs. Pharmacy clearance to hold med vs. Both)? Both ? ?Are there any medications that need to be held prior to surgery and how long? Not specified  ? ?Practice name and name of physician performing surgery? Dr. Emmaline Kluver   ? ?What is your office phone number: [phone]  ?  ?7.   What is your office fax number: [phone] ? ?8.   Anesthesia type (None, local, MAC, general) ? General Anesthesia  ? ? ?Micheal [name] ?12/11/2021, 1:22 PM  ?_________________________________________________________________ ?  (provider comments below)  ?

## 2021-12-11 NOTE — Telephone Encounter (Signed)
Patient is a new patient visit with Dr. Geraldo Pitter early next month for preoperative clearance.  We will defer to MD regarding final clearance. ?

## 2022-01-09 ENCOUNTER — Ambulatory Visit (INDEPENDENT_AMBULATORY_CARE_PROVIDER_SITE_OTHER): Payer: 59 | Admitting: Cardiology

## 2022-01-09 ENCOUNTER — Encounter: Payer: Self-pay | Admitting: Cardiology

## 2022-01-09 VITALS — BP 120/70 | HR 80 | Ht 66.0 in | Wt 171.0 lb

## 2022-01-09 DIAGNOSIS — I10 Essential (primary) hypertension: Secondary | ICD-10-CM

## 2022-01-09 DIAGNOSIS — E0849 Diabetes mellitus due to underlying condition with other diabetic neurological complication: Secondary | ICD-10-CM

## 2022-01-09 DIAGNOSIS — E782 Mixed hyperlipidemia: Secondary | ICD-10-CM | POA: Diagnosis not present

## 2022-01-09 DIAGNOSIS — R931 Abnormal findings on diagnostic imaging of heart and coronary circulation: Secondary | ICD-10-CM

## 2022-01-09 DIAGNOSIS — N1832 Chronic kidney disease, stage 3b: Secondary | ICD-10-CM

## 2022-01-09 DIAGNOSIS — G20 Parkinson's disease: Secondary | ICD-10-CM

## 2022-01-09 DIAGNOSIS — E1121 Type 2 diabetes mellitus with diabetic nephropathy: Secondary | ICD-10-CM

## 2022-01-09 HISTORY — DX: Abnormal findings on diagnostic imaging of heart and coronary circulation: R93.1

## 2022-01-09 MED ORDER — NITROGLYCERIN 0.4 MG SL SUBL: 0.4000 mg | SUBLINGUAL_TABLET | SUBLINGUAL | 2 refills | Status: DC | PRN

## 2022-01-09 MED ORDER — ASPIRIN 81 MG PO TBEC: 81.0000 mg | DELAYED_RELEASE_TABLET | Freq: Every day | ORAL | 3 refills | Status: DC
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
## 2022-01-09 NOTE — Patient Instructions (Signed)
Medication Instructions:  Your physician has recommended you make the following change in your medication:   START: Aspirin 81 mg daily START: Nitroglycerin 0.4 mg under the tongue every 5 minutes as needed for chest pain   *If you need a refill on your cardiac medications before your next appointment, please call your pharmacy*   Lab Work: Your physician recommends that you return for lab work in:   Labs today: BMP, CBC  If you have labs (blood work) drawn today and your tests are completely normal, you will receive your results only by: [place] (if you have MyChart) OR A paper copy in the mail If you have any lab test that is abnormal or we need to change your treatment, we will call you to review the results.   Testing/Procedures:  [place] [address] Dept: [phone] Loc: [place].  01/09/2022  You are scheduled for a Cardiac Catheterization on Wednesday, June 14 with Dr. Glenetta Hew.  1. Please arrive at the Main Entrance A at [facility]: [place], [place] [address] at 6:30 AM (This time is two hours before your procedure to ensure your preparation). Free valet parking service is available.   Special note: Every effort is made to have your procedure done on time. Please understand that emergencies sometimes delay scheduled procedures.  2. Diet: Do not eat solid foods after midnight.  You may have clear liquids until 5 AM upon the day of the procedure.  3. Labs: You will need to have blood drawn on Thursday, June 8 at [facility]: [address] Technical sales engineer . You do not need to be fasting.  4. Medication instructions in preparation for your procedure:   Contrast Allergy: No  Hold: Lasix and Hydrochlorothiazide the morning of the procedure   On the morning of your procedure, take Aspirin and any morning  medicines NOT listed above.  You may use sips of water.  5. Plan to go home the same day, you will only stay overnight if medically necessary. 6. You MUST have a responsible adult to drive you home. 7. An adult MUST be with you the first 24 hours after you arrive home. 8. Bring a current list of your medications, and the last time and date medication taken. 9. Bring ID and current insurance cards. 10.Please wear clothes that are easy to get on and off and wear slip-on shoes.  Thank you for allowing [place] to care for you!   -- [facility]    Follow-Up: At [facility], you and your health needs are our priority.  As part of our continuing mission to provide you with exceptional heart care, we have created designated Provider Care Teams.  These Care Teams include your primary Cardiologist (physician) and Advanced Practice Providers (APPs -  Physician Assistants and Nurse Practitioners) who all work together to provide you with the care you need, when you need it.  We recommend signing up for the patient portal called "MyChart".  Sign up information is provided on this After Visit Summary.  MyChart is used to connect with patients for Virtual Visits (Telemedicine).  Patients are able to view lab/test results, encounter notes, upcoming appointments, etc.  Non-urgent messages can be sent to your provider as well.   To learn more about what you can do with MyChart, go to [email or web address].  Your next appointment:   6 month(s)  The format for your next appointment:   In Person  Provider:   Jyl Heinz, MD    Other Instructions None  Important Information About Sugar
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
## 2022-01-10 LAB — BASIC METABOLIC PANEL
BUN/Creatinine Ratio: 19 (ref 10–24)
BUN: 27 mg/dL (ref 8–27)
CO2: 23 mmol/L (ref 20–29)
Calcium: 8.9 mg/dL (ref 8.6–10.2)
Chloride: 104 mmol/L (ref 96–106)
Creatinine, Ser: 1.45 mg/dL — ABNORMAL HIGH (ref 0.76–1.27)
Glucose: 110 mg/dL — ABNORMAL HIGH (ref 70–99)
Potassium: 4.7 mmol/L (ref 3.5–5.2)
Sodium: 140 mmol/L (ref 134–144)
eGFR: 49 mL/min/{1.73_m2} — ABNORMAL LOW (ref >59)

## 2022-01-10 LAB — CBC
Hematocrit: 35.9 % — ABNORMAL LOW (ref 37.5–51.0)
Hemoglobin: 12.2 g/dL — ABNORMAL LOW (ref 13.0–17.7)
MCH: 30.1 pg (ref 26.6–33.0)
MCHC: 34 g/dL (ref 31.5–35.7)
MCV: 89 fL (ref 79–97)
Platelets: 273 10*3/uL (ref 150–450)
RBC: 4.05 x10E6/uL — ABNORMAL LOW (ref 4.14–5.80)
RDW: 12.8 % (ref 11.6–15.4)
WBC: 7.4 10*3/uL (ref 3.4–10.8)

## 2022-01-14 ENCOUNTER — Telehealth: Payer: Self-pay | Admitting: *Deleted

## 2022-01-14 NOTE — Telephone Encounter (Signed)
Cardiac Catheterization scheduled at [facility] for: Wednesday January 15, 2022 11:30 AM Arrival time and place: [facility] En[facility] A at: 6:30 AM-pre-procedure hydration   Nothing to eat after midnight prior to procedure, clear liquids until 5 AM day of procedure.  Medication instructions: -Hold:  Losartan/Lasix/HCTZ-day before and day of procedure -per protocol GFR 49-pt already taken today  Metformin-day of procedure and 48 hours post procedure -Except hold medications usual morning medications can be taken with sips of water including aspirin 81 mg.  Confirmed patient has responsible adult to drive home post procedure and be with patient first 24 hours after arriving home.  Patient reports no new symptoms concerning for COVID-19.  Reviewed procedure instructions with patient.

## 2022-01-15 ENCOUNTER — Ambulatory Visit (HOSPITAL_COMMUNITY)
Admission: RE | Admit: 2022-01-15 | Discharge: 2022-01-15 | Disposition: A | Discharge status: Home / Self Care | Payer: 59 | Attending: Cardiology | Admitting: Cardiology

## 2022-01-15 ENCOUNTER — Encounter (HOSPITAL_COMMUNITY)
Admission: RE | Disposition: A | Discharge status: Home / Self Care | Payer: Self-pay | Source: Home / Self Care | Attending: Cardiology

## 2022-01-15 DIAGNOSIS — N1832 Chronic kidney disease, stage 3b: Secondary | ICD-10-CM | POA: Diagnosis not present

## 2022-01-15 DIAGNOSIS — I129 Hypertensive chronic kidney disease with stage 1 through stage 4 chronic kidney disease, or unspecified chronic kidney disease: Secondary | ICD-10-CM | POA: Diagnosis not present

## 2022-01-15 DIAGNOSIS — Z87891 Personal history of nicotine dependence: Secondary | ICD-10-CM | POA: Insufficient documentation

## 2022-01-15 DIAGNOSIS — G20 Parkinson's disease: Secondary | ICD-10-CM | POA: Insufficient documentation

## 2022-01-15 DIAGNOSIS — Z01810 Encounter for preprocedural cardiovascular examination: Secondary | ICD-10-CM

## 2022-01-15 DIAGNOSIS — R931 Abnormal findings on diagnostic imaging of heart and coronary circulation: Secondary | ICD-10-CM

## 2022-01-15 DIAGNOSIS — I10 Essential (primary) hypertension: Secondary | ICD-10-CM

## 2022-01-15 DIAGNOSIS — E782 Mixed hyperlipidemia: Secondary | ICD-10-CM | POA: Diagnosis not present

## 2022-01-15 DIAGNOSIS — E1121 Type 2 diabetes mellitus with diabetic nephropathy: Secondary | ICD-10-CM

## 2022-01-15 DIAGNOSIS — I2510 Atherosclerotic heart disease of native coronary artery without angina pectoris: Secondary | ICD-10-CM

## 2022-01-15 DIAGNOSIS — E0849 Diabetes mellitus due to underlying condition with other diabetic neurological complication: Secondary | ICD-10-CM

## 2022-01-15 DIAGNOSIS — R9439 Abnormal result of other cardiovascular function study: Secondary | ICD-10-CM | POA: Diagnosis present

## 2022-01-15 DIAGNOSIS — E1122 Type 2 diabetes mellitus with diabetic chronic kidney disease: Secondary | ICD-10-CM | POA: Insufficient documentation

## 2022-01-15 HISTORY — PX: LEFT HEART CATH AND CORONARY ANGIOGRAPHY: CATH118249

## 2022-01-15 LAB — GLUCOSE, CAPILLARY
Glucose-Capillary: 90 mg/dL (ref 70–99)
Glucose-Capillary: 94 mg/dL (ref 70–99)

## 2022-01-15 SURGERY — LEFT HEART CATH AND CORONARY ANGIOGRAPHY
Anesthesia: LOCAL

## 2022-01-15 MED ORDER — SODIUM CHLORIDE 0.9% FLUSH: 3.0000 mL | INTRAVENOUS | Status: DC | PRN

## 2022-01-15 MED ORDER — SODIUM CHLORIDE 0.9 % IV SOLN: 250.0000 mL | INTRAVENOUS | Status: DC | PRN

## 2022-01-15 MED ORDER — MIDAZOLAM HCL 2 MG/2ML IJ SOLN
INTRAMUSCULAR | Status: DC | PRN
  Administered 2022-01-15: 1 mg via INTRAVENOUS

## 2022-01-15 MED ORDER — LIDOCAINE HCL (PF) 1 % IJ SOLN
INTRAMUSCULAR | Status: AC
  Filled 2022-01-15: qty 30

## 2022-01-15 MED ORDER — VERAPAMIL HCL 2.5 MG/ML IV SOLN
INTRAVENOUS | Status: AC
  Filled 2022-01-15: qty 2

## 2022-01-15 MED ORDER — ASPIRIN 81 MG PO CHEW
81.0000 mg | CHEWABLE_TABLET | ORAL | Status: AC
  Administered 2022-01-15: 81 mg via ORAL
  Filled 2022-01-15: qty 1

## 2022-01-15 MED ORDER — HEPARIN SODIUM (PORCINE) 1000 UNIT/ML IJ SOLN
INTRAMUSCULAR | Status: AC
  Filled 2022-01-15: qty 10

## 2022-01-15 MED ORDER — VERAPAMIL HCL 2.5 MG/ML IV SOLN
INTRAVENOUS | Status: DC | PRN
  Administered 2022-01-15: 10 mL via INTRA_ARTERIAL

## 2022-01-15 MED ORDER — ACETAMINOPHEN 325 MG PO TABS: 650.0000 mg | ORAL_TABLET | ORAL | Status: DC | PRN

## 2022-01-15 MED ORDER — SODIUM CHLORIDE 0.9% FLUSH: 3.0000 mL | Freq: Two times a day (BID) | INTRAVENOUS | Status: DC

## 2022-01-15 MED ORDER — FENTANYL CITRATE (PF) 100 MCG/2ML IJ SOLN
INTRAMUSCULAR | Status: DC | PRN
Start: 2022-01-15 — End: 2022-01-15
  Administered 2022-01-15: 25 ug via INTRAVENOUS

## 2022-01-15 MED ORDER — FENTANYL CITRATE (PF) 100 MCG/2ML IJ SOLN
INTRAMUSCULAR | Status: AC
  Filled 2022-01-15: qty 2

## 2022-01-15 MED ORDER — SODIUM CHLORIDE 0.9 % WEIGHT BASED INFUSION: 1.0000 mL/kg/h | INTRAVENOUS | Status: DC

## 2022-01-15 MED ORDER — LABETALOL HCL 5 MG/ML IV SOLN: 10.0000 mg | INTRAVENOUS | Status: DC | PRN

## 2022-01-15 MED ORDER — MIDAZOLAM HCL 2 MG/2ML IJ SOLN
INTRAMUSCULAR | Status: AC
  Filled 2022-01-15: qty 2

## 2022-01-15 MED ORDER — HEPARIN (PORCINE) IN NACL 1000-0.9 UT/500ML-% IV SOLN
INTRAVENOUS | Status: DC | PRN
  Administered 2022-01-15 (×2): 500 mL

## 2022-01-15 MED ORDER — HYDRALAZINE HCL 20 MG/ML IJ SOLN: 10.0000 mg | INTRAMUSCULAR | Status: DC | PRN

## 2022-01-15 MED ORDER — ONDANSETRON HCL 4 MG/2ML IJ SOLN: 4.0000 mg | Freq: Four times a day (QID) | INTRAMUSCULAR | Status: DC | PRN

## 2022-01-15 MED ORDER — IOHEXOL 350 MG/ML SOLN
INTRAVENOUS | Status: DC | PRN
  Administered 2022-01-15: 40 mL

## 2022-01-15 MED ORDER — SODIUM CHLORIDE 0.9 % WEIGHT BASED INFUSION
3.0000 mL/kg/h | INTRAVENOUS | Status: AC
  Administered 2022-01-15: 3 mL/kg/h via INTRAVENOUS

## 2022-01-15 MED ORDER — HEPARIN SODIUM (PORCINE) 1000 UNIT/ML IJ SOLN
INTRAMUSCULAR | Status: DC | PRN
  Administered 2022-01-15: 4000 [IU] via INTRAVENOUS

## 2022-01-15 MED ORDER — LIDOCAINE HCL (PF) 1 % IJ SOLN
INTRAMUSCULAR | Status: DC | PRN
  Administered 2022-01-15: 2 mL

## 2022-01-15 MED ORDER — HEPARIN (PORCINE) IN NACL 1000-0.9 UT/500ML-% IV SOLN
INTRAVENOUS | Status: AC
  Filled 2022-01-15: qty 1000

## 2022-01-15 SURGICAL SUPPLY — 11 items
BAND ZEPHYR COMPRESS 30 LONG (HEMOSTASIS) ×1 IMPLANT
CATH OPTITORQUE TIG 4.0 5F (CATHETERS) ×1 IMPLANT
GLIDESHEATH SLEND A-KIT 6F 22G (SHEATH) ×1 IMPLANT
GLIDESHEATH SLEND SS 6F .021 (SHEATH) ×1 IMPLANT
GUIDEWIRE INQWIRE 1.5J.035X260 (WIRE) IMPLANT
INQWIRE 1.5J .035X260CM (WIRE) ×2
KIT HEART LEFT (KITS) ×2 IMPLANT
PACK CARDIAC CATHETERIZATION (CUSTOM PROCEDURE TRAY) ×2 IMPLANT
TRANSDUCER W/STOPCOCK (MISCELLANEOUS) ×2 IMPLANT
TUBING CIL FLEX 10 FLL-RA (TUBING) ×2 IMPLANT
WIRE HI TORQ VERSACORE-J 145CM (WIRE) ×1 IMPLANT

## 2022-01-15 NOTE — Brief Op Note (Signed)
Cardiology Office Note:     01/15/2022; 1:33 PM  ID:  Verita Schneiders., DOB [birth year], MRN [id number]   PCP:  Cher Nakai, MD            Cardiologist:  Jenean Lindau, MD   PROCEDURE:  Procedure(s): LEFT HEART CATH AND CORONARY ANGIOGRAPHY (N/A)  SURGEON:  Surgeon(s) and Role:    * Leonie Man, MD - Primary  PATIENT:  Micheal [name].  81 y.o. male with a reported history of CAD having never had a heart catheterization.  He has significant tremor from Parkinson's disease, but is relatively active bicycling on a stationary bike 15 to 30 minutes without chest pain or pressure.  He has had ongoing left shoulder discomfort is pending arthroplastic surgery.  He had a stress test on, ostensibly for preop that revealed lateral ischemia.  He was then referred to Dr. Geraldo Pitter who was obligated therefore to refer for cardiac catheterization.  The patient is not symptomatic with no chest pain, pressure or dyspnea with rest or exertion.  He is somewhat sedentary from walking standpoint, but is able to ride his stationary bicycle up to 30 minutes without any symptoms.  In the last few weeks since his shoulder has become significantly painful, he has not been able to ride the bicycle because he not able to hang onto the handlebars.  He now presents for cardiac catheterization.  PRE-OPERATIVE DIAGNOSIS:  CAD-lateral ischemia on Myoview  POST-OPERATIVE DIAGNOSIS:   Distal LM 40% stenosis with Smooth ostial LCx 65 to 70% stenosis-not approachable from percutaneous standpoint due to left main involvement and angle such that this would jail the LAD Otherwise mild diffuse disease throughout the remainder of the coronary system. Normal LVEDP  Time Out: Verified patient identification, verified procedure, site/side was marked, verified correct patient position, special equipment/implants available, medications/allergies/relevent history reviewed, required imaging and test results available.  Performed.  Access:  Right ulnar artery: 6 Fr sheath -- Seldinger technique using Angiocath Kit Lateral ischemia ostial (wire would not advance through the radial artery) -- Direct ultrasound guidance used.  Permanent image obtained and placed on chart. -- 10 mL radial cocktail IA; 4000 units IV Heparin  Left Heart Catheterization: A 5 Fr TIG 4.0 catheter was advanced or exchanged over a J-wire under direct fluoroscopic guidance into the ascending aorta; the catheter was advanced across the aortic valve for hemodynamics.  After measurements, the catheter was pulled back across aortic valve for management of aortic valve gradient.  The catheter was then used to engage first the left than the right coronary arteries for selective coronary cineangiography..   Review of initial angiography revealed: Clear culprit lesion being the ostial LCx is a continuation of 40% distal LM-70% LCx.  Not critical, smooth.  Not favorable for PCI  Preparations are made for medical management of moderate CAD  Upon completion of Angiogaphy, the catheter was removed completely out of the body over a wire, without complication.  Radial sheath removed in the Cardiac Catheterization lab with Zephyr pneumatic band placed for hemostasis.  Zephyr band: 1325  Hours; 10 mL air  MEDICATIONS SQ Lidocaine 3 mL Radial Cocktail: 3 mg Verapmil in 10 mL NS Heparin: 4000 units   ANESTHESIA:   local and IV sedation; local lidocaine 3 mL, IV 1 mg Versed and 25 mcg fentanyl  EBL:  <10 mL  COUNTS:  YES  DICTATION: .Note written in EPIC  PATIENT DISPOSITION:  PACU - hemodynamically stable.   PLAN  OF CARE: Discharge to home after PACU The culprit LCx lesion is not favorable for PCI, but he is not having symptoms. In an asymptomatic 81 year old gentleman who is preop for shoulder surgery, would recommend proceeding to surgery with no further cardiac evaluation other than potentially considering beta-blocker if there is time  to initiate prior to surgery. In the absence of symptoms-he is able to ride a stationary bicycle for 30 minutes without angina, I would not recommend further treatment other than medical management.      Delay start of Pharmacological VTE agent (>24hrs) due to surgical blood loss or risk of bleeding: not applicable    Glenetta Hew, MD

## 2022-01-15 NOTE — Interval H&P Note (Signed)
History and Physical Interval Note:  01/15/2022 12:41 PM  Micheal [name] Maryruth Eve.  has presented today for surgery, with the diagnosis of abnormal Nuclear ST  .  The various methods of treatment have been discussed with the patient and family. After consideration of risks, benefits and other options for treatment, the patient has consented to  Procedure(s): LEFT HEART CATH AND CORONARY ANGIOGRAPHY (N/A) PERCUTANEOUS CORONARY INTERVENTION    as a surgical intervention.  The patient's history has been reviewed, patient examined, no change in status, stable for surgery.  I have reviewed the patient's chart and labs.  Questions were answered to the patient's satisfaction.    Cath Lab Visit (complete for each Cath Lab visit)  Clinical Evaluation Leading to the Procedure:   ACS: No.  Non-ACS:    Anginal Classification: No Symptoms  Anti-ischemic medical therapy: Minimal Therapy (1 class of medications)  Non-Invasive Test Results: Intermediate-risk stress test findings: cardiac mortality 1-3%/year  Prior CABG: No previous CABG     Glenetta Hew

## 2022-01-16 ENCOUNTER — Encounter (HOSPITAL_COMMUNITY): Payer: Self-pay | Admitting: Cardiology

## 2022-04-29 ENCOUNTER — Other Ambulatory Visit: Payer: Self-pay | Admitting: Orthopedic Surgery

## 2022-04-29 DIAGNOSIS — M75102 Unspecified rotator cuff tear or rupture of left shoulder, not specified as traumatic: Secondary | ICD-10-CM

## 2022-05-01 ENCOUNTER — Other Ambulatory Visit: Payer: 59

## 2022-08-29 ENCOUNTER — Telehealth: Payer: Self-pay | Admitting: Cardiology

## 2022-08-29 MED ORDER — DILTIAZEM HCL ER COATED BEADS 120 MG PO CP24: 120.0000 mg | ORAL_CAPSULE | Freq: Every day | ORAL | 3 refills | Status: DC

## 2022-08-29 NOTE — Telephone Encounter (Signed)
Pt's wife is calling back to get update on medication. Requesting return call.

## 2022-08-29 NOTE — Telephone Encounter (Signed)
30 day trial offer for Eliquis left at front for pt per S. Price.

## 2022-08-29 NOTE — Telephone Encounter (Signed)
Ok to change Rx per Dr. Agustin Cree. New Rx sent to pharmacy. Confirmed w/ pharmacy that they have this on hand. Advised to call back if HRs did not improve after starting the medication.  Aware will leave a 30 day free Eliquis card at front desk for them to pick up at the [facility] office.  Wife aware forwarding this note to Dr. Julien Nordmann nurse, who will follow up with them next week for a sooner appt w/ Dr. Geraldo Pitter.for new onset afib.  Wife appreciates my help with this matter.

## 2022-08-29 NOTE — Telephone Encounter (Signed)
Patient c/o Palpitations:  High priority if patient c/o lightheadedness, shortness of breath, or chest pain  How long have you had palpitations/irregular HR/ Afib? Are you having the symptoms now?  Patient's wife states yesterday while the patient was in their office he was in afib  Are you currently experiencing lightheadedness, SOB or CP?  Chest pain/SOB  Do you have a history of afib (atrial fibrillation) or irregular heart rhythm?  No   Have you checked your BP or HR? (document readings if available):  90's-146 HR yesterday   Are you experiencing any other symptoms?  Chest pain, SOB with movement - symptoms started after having the flu

## 2022-08-29 NOTE — Telephone Encounter (Signed)
Spoke to wife. She reports new onset afib at PCP yesterday. HRs in 140s. PCP started pt on Eliquis & Diltiazem 120 mg, but she explains that pharmacy did not have the Diltiazem Rx on hand so pt has not started it yet. Called and spoke to pharmacy about the Rx. Pts insurance does not cover the Rx they send it, they need Diltiazem XT sent in.  Aware I will speak to MD for approval to send in a different Rx for this matter. Spoke to Jacobo Forest, RN to ask Dr. Agustin Cree if ok to send in different Diltiazem Rx for pt (Dr. Geraldo Pitter is not in  the office today). Will await answer from [facility].

## 2022-09-02 ENCOUNTER — Other Ambulatory Visit: Payer: Self-pay

## 2022-09-04 ENCOUNTER — Encounter: Payer: Self-pay | Admitting: Cardiology

## 2022-09-04 ENCOUNTER — Ambulatory Visit: Payer: 59 | Attending: Cardiology | Admitting: Cardiology

## 2022-09-04 VITALS — BP 96/55 | HR 112 | Ht 66.6 in | Wt 148.8 lb

## 2022-09-04 DIAGNOSIS — M5416 Radiculopathy, lumbar region: Secondary | ICD-10-CM

## 2022-09-04 DIAGNOSIS — I10 Essential (primary) hypertension: Secondary | ICD-10-CM

## 2022-09-04 DIAGNOSIS — I2510 Atherosclerotic heart disease of native coronary artery without angina pectoris: Secondary | ICD-10-CM

## 2022-09-04 DIAGNOSIS — I4819 Other persistent atrial fibrillation: Secondary | ICD-10-CM | POA: Insufficient documentation

## 2022-09-04 DIAGNOSIS — E119 Type 2 diabetes mellitus without complications: Secondary | ICD-10-CM

## 2022-09-04 DIAGNOSIS — E782 Mixed hyperlipidemia: Secondary | ICD-10-CM

## 2022-09-04 HISTORY — DX: Other persistent atrial fibrillation: I48.19

## 2022-09-04 HISTORY — DX: Radiculopathy, lumbar region: M54.16

## 2022-09-04 MED ORDER — DIGOXIN 125 MCG PO TABS: 0.1250 mg | ORAL_TABLET | Freq: Every day | ORAL | 3 refills | Status: DC

## 2022-09-04 NOTE — Patient Instructions (Signed)
Medication Instructions:  Your physician has recommended you make the following change in your medication:   START: Digoxin 0.125 mg daily STOP: Amlodipine  *If you need a refill on your cardiac medications before your next appointment, please call your pharmacy*   Lab Work: None If you have labs (blood work) drawn today and your tests are completely normal, you will receive your results only by: [place] (if you have MyChart) OR A paper copy in the mail If you have any lab test that is abnormal or we need to change your treatment, we will call you to review the results.   Testing/Procedures: None   Follow-Up: At [facility], you and your health needs are our priority.  As part of our continuing mission to provide you with exceptional heart care, we have created designated Provider Care Teams.  These Care Teams include your primary Cardiologist (physician) and Advanced Practice Providers (APPs -  Physician Assistants and Nurse Practitioners) who all work together to provide you with the care you need, when you need it.  We recommend signing up for the patient portal called "MyChart".  Sign up information is provided on this After Visit Summary.  MyChart is used to connect with patients for Virtual Visits (Telemedicine).  Patients are able to view lab/test results, encounter notes, upcoming appointments, etc.  Non-urgent messages can be sent to your provider as well.   To learn more about what you can do with MyChart, go to [email or web address].    Your next appointment:   1 month(s)  Provider:   Jyl Heinz MD  Other Instructions None
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
## 2022-09-11 ENCOUNTER — Ambulatory Visit: Payer: 59 | Attending: Cardiology

## 2022-09-11 VITALS — BP 104/60 | HR 113 | Ht 66.6 in | Wt 146.0 lb

## 2022-09-11 DIAGNOSIS — Z79899 Other long term (current) drug therapy: Secondary | ICD-10-CM

## 2022-09-11 NOTE — Progress Notes (Signed)
   Nurse Visit   Date of Encounter: 09/11/2022 ID: Verita Schneiders., DOB 08-Nov-1940, MRN [id number]  PCP:  Cher Nakai, MD   [place] Providers Cardiologist:  None      Visit Details   VS:  BP 104/60 (BP Location: Left Arm, Patient Position: Sitting, Cuff Size: Normal)   Pulse (!) 113   Ht 5' 6.6" (1.692 m)   Wt 146 lb (66.2 kg)   SpO2 96%   BMI 23.14 kg/m  , BMI Body mass index is 23.14 kg/m.  Wt Readings from Last 3 Encounters:  09/11/22 146 lb (66.2 kg)  09/04/22 148 lb 12.8 oz (67.5 kg)  01/15/22 170 lb (77.1 kg)     Reason for visit: perform EKG and vitals Performed today: EKG, Vitals, Education and Provider Consulted Changes (medications, testing, etc.) : Digoxin level ordered Length of Visit: 20 minutes    Medications Adjustments/Labs and Tests Ordered: Orders Placed This Encounter  Procedures   Digoxin level   EKG 12-Lead   No orders of the defined types were placed in this encounter.    Signed, Louie Casa, RN  09/11/2022 10:46 AM

## 2022-09-13 LAB — DIGOXIN LEVEL: Digoxin, Serum: 1.5 ng/mL — ABNORMAL HIGH (ref 0.5–0.9)

## 2022-09-15 ENCOUNTER — Other Ambulatory Visit: Payer: Self-pay

## 2022-10-06 ENCOUNTER — Ambulatory Visit: Payer: 59 | Admitting: Cardiology

## 2022-10-08 ENCOUNTER — Ambulatory Visit: Payer: 59 | Attending: Cardiology | Admitting: Cardiology

## 2022-10-08 ENCOUNTER — Encounter: Payer: Self-pay | Admitting: Cardiology

## 2022-10-08 ENCOUNTER — Ambulatory Visit: Payer: 59 | Admitting: Cardiology

## 2022-10-08 VITALS — BP 116/65 | HR 82 | Ht 66.6 in | Wt 149.2 lb

## 2022-10-08 DIAGNOSIS — E119 Type 2 diabetes mellitus without complications: Secondary | ICD-10-CM | POA: Diagnosis not present

## 2022-10-08 DIAGNOSIS — E7800 Pure hypercholesterolemia, unspecified: Secondary | ICD-10-CM

## 2022-10-08 DIAGNOSIS — I4819 Other persistent atrial fibrillation: Secondary | ICD-10-CM

## 2022-10-08 DIAGNOSIS — I2510 Atherosclerotic heart disease of native coronary artery without angina pectoris: Secondary | ICD-10-CM | POA: Diagnosis not present

## 2022-10-08 DIAGNOSIS — N1832 Chronic kidney disease, stage 3b: Secondary | ICD-10-CM

## 2022-10-08 NOTE — Patient Instructions (Signed)
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
## 2022-12-21 ENCOUNTER — Emergency Department (HOSPITAL_COMMUNITY): Payer: 59

## 2022-12-21 ENCOUNTER — Other Ambulatory Visit: Payer: Self-pay

## 2022-12-21 ENCOUNTER — Encounter (HOSPITAL_COMMUNITY): Payer: Self-pay

## 2022-12-21 ENCOUNTER — Observation Stay (HOSPITAL_COMMUNITY)
Admission: EM | Admit: 2022-12-21 | Discharge: 2022-12-22 | Disposition: A | Discharge status: Home Health | Payer: 59 | Attending: Internal Medicine | Admitting: Internal Medicine

## 2022-12-21 ENCOUNTER — Observation Stay (HOSPITAL_COMMUNITY): Payer: 59

## 2022-12-21 DIAGNOSIS — I4819 Other persistent atrial fibrillation: Secondary | ICD-10-CM | POA: Diagnosis present

## 2022-12-21 DIAGNOSIS — I679 Cerebrovascular disease, unspecified: Secondary | ICD-10-CM | POA: Diagnosis present

## 2022-12-21 DIAGNOSIS — G20C Parkinsonism, unspecified: Secondary | ICD-10-CM | POA: Diagnosis not present

## 2022-12-21 DIAGNOSIS — E1122 Type 2 diabetes mellitus with diabetic chronic kidney disease: Secondary | ICD-10-CM | POA: Insufficient documentation

## 2022-12-21 DIAGNOSIS — I952 Hypotension due to drugs: Secondary | ICD-10-CM

## 2022-12-21 DIAGNOSIS — Z79899 Other long term (current) drug therapy: Secondary | ICD-10-CM | POA: Diagnosis not present

## 2022-12-21 DIAGNOSIS — Z87891 Personal history of nicotine dependence: Secondary | ICD-10-CM | POA: Diagnosis not present

## 2022-12-21 DIAGNOSIS — N1832 Chronic kidney disease, stage 3b: Secondary | ICD-10-CM | POA: Diagnosis present

## 2022-12-21 DIAGNOSIS — Z7901 Long term (current) use of anticoagulants: Secondary | ICD-10-CM | POA: Insufficient documentation

## 2022-12-21 DIAGNOSIS — Z7984 Long term (current) use of oral hypoglycemic drugs: Secondary | ICD-10-CM | POA: Diagnosis not present

## 2022-12-21 DIAGNOSIS — I4811 Longstanding persistent atrial fibrillation: Secondary | ICD-10-CM | POA: Diagnosis not present

## 2022-12-21 DIAGNOSIS — I959 Hypotension, unspecified: Principal | ICD-10-CM | POA: Insufficient documentation

## 2022-12-21 DIAGNOSIS — Z8673 Personal history of transient ischemic attack (TIA), and cerebral infarction without residual deficits: Secondary | ICD-10-CM | POA: Diagnosis not present

## 2022-12-21 DIAGNOSIS — I129 Hypertensive chronic kidney disease with stage 1 through stage 4 chronic kidney disease, or unspecified chronic kidney disease: Secondary | ICD-10-CM | POA: Insufficient documentation

## 2022-12-21 DIAGNOSIS — I2510 Atherosclerotic heart disease of native coronary artery without angina pectoris: Secondary | ICD-10-CM | POA: Diagnosis not present

## 2022-12-21 DIAGNOSIS — I639 Cerebral infarction, unspecified: Secondary | ICD-10-CM

## 2022-12-21 DIAGNOSIS — R079 Chest pain, unspecified: Secondary | ICD-10-CM | POA: Diagnosis present

## 2022-12-21 DIAGNOSIS — E1121 Type 2 diabetes mellitus with diabetic nephropathy: Secondary | ICD-10-CM | POA: Diagnosis present

## 2022-12-21 HISTORY — DX: Hypotension due to drugs: I95.2

## 2022-12-21 LAB — I-STAT CHEM 8, ED
BUN: 28 mg/dL — ABNORMAL HIGH (ref 8–23)
Calcium, Ion: 1.1 mmol/L — ABNORMAL LOW (ref 1.15–1.40)
Chloride: 106 mmol/L (ref 98–111)
Creatinine, Ser: 1.3 mg/dL — ABNORMAL HIGH (ref 0.61–1.24)
Glucose, Bld: 98 mg/dL (ref 70–99)
HCT: 32 % — ABNORMAL LOW (ref 39.0–52.0)
Hemoglobin: 10.9 g/dL — ABNORMAL LOW (ref 13.0–17.0)
Potassium: 4.3 mmol/L (ref 3.5–5.1)
Sodium: 141 mmol/L (ref 135–145)
TCO2: 22 mmol/L (ref 22–32)

## 2022-12-21 LAB — DIFFERENTIAL
Abs Immature Granulocytes: 0.03 10*3/uL (ref 0.00–0.07)
Basophils Absolute: 0 10*3/uL (ref 0.0–0.1)
Basophils Relative: 0 %
Eosinophils Absolute: 0 10*3/uL (ref 0.0–0.5)
Eosinophils Relative: 0 %
Immature Granulocytes: 0 %
Lymphocytes Relative: 15 %
Lymphs Abs: 1.6 10*3/uL (ref 0.7–4.0)
Monocytes Absolute: 0.7 10*3/uL (ref 0.1–1.0)
Monocytes Relative: 7 %
Neutro Abs: 8.2 10*3/uL — ABNORMAL HIGH (ref 1.7–7.7)
Neutrophils Relative %: 78 %

## 2022-12-21 LAB — CBC
HCT: 35.9 % — ABNORMAL LOW (ref 39.0–52.0)
Hemoglobin: 10.8 g/dL — ABNORMAL LOW (ref 13.0–17.0)
MCH: 29.9 pg (ref 26.0–34.0)
MCHC: 30.1 g/dL (ref 30.0–36.0)
MCV: 99.4 fL (ref 80.0–100.0)
Platelets: 251 10*3/uL (ref 150–400)
RBC: 3.61 MIL/uL — ABNORMAL LOW (ref 4.22–5.81)
RDW: 13 % (ref 11.5–15.5)
WBC: 10.5 10*3/uL (ref 4.0–10.5)
nRBC: 0 % (ref 0.0–0.2)

## 2022-12-21 LAB — APTT: aPTT: 26 seconds (ref 24–36)

## 2022-12-21 LAB — COMPREHENSIVE METABOLIC PANEL
ALT: 8 U/L (ref 0–44)
AST: 15 U/L (ref 15–41)
Albumin: 3.7 g/dL (ref 3.5–5.0)
Alkaline Phosphatase: 56 U/L (ref 38–126)
Anion gap: 15 (ref 5–15)
BUN: 26 mg/dL — ABNORMAL HIGH (ref 8–23)
CO2: 19 mmol/L — ABNORMAL LOW (ref 22–32)
Calcium: 8.9 mg/dL (ref 8.9–10.3)
Chloride: 105 mmol/L (ref 98–111)
Creatinine, Ser: 1.34 mg/dL — ABNORMAL HIGH (ref 0.61–1.24)
GFR, Estimated: 54 mL/min — ABNORMAL LOW (ref >60)
Glucose, Bld: 99 mg/dL (ref 70–99)
Potassium: 4.3 mmol/L (ref 3.5–5.1)
Sodium: 139 mmol/L (ref 135–145)
Total Bilirubin: 0.8 mg/dL (ref 0.3–1.2)
Total Protein: 6.1 g/dL — ABNORMAL LOW (ref 6.5–8.1)

## 2022-12-21 LAB — MAGNESIUM: Magnesium: 1.2 mg/dL — ABNORMAL LOW (ref 1.7–2.4)

## 2022-12-21 LAB — CBG MONITORING, ED
Glucose-Capillary: 102 mg/dL — ABNORMAL HIGH (ref 70–99)
Glucose-Capillary: 105 mg/dL — ABNORMAL HIGH (ref 70–99)

## 2022-12-21 LAB — PROTIME-INR
INR: 1.3 — ABNORMAL HIGH (ref 0.8–1.2)
Prothrombin Time: 16.8 seconds — ABNORMAL HIGH (ref 11.4–15.2)

## 2022-12-21 LAB — LIPID PANEL
Cholesterol: 65 mg/dL (ref 0–200)
HDL: 32 mg/dL — ABNORMAL LOW (ref >40)
LDL Cholesterol: 17 mg/dL (ref 0–99)
Total CHOL/HDL Ratio: 2 RATIO
Triglycerides: 82 mg/dL (ref <150)
VLDL: 16 mg/dL (ref 0–40)

## 2022-12-21 LAB — TROPONIN I (HIGH SENSITIVITY): Troponin I (High Sensitivity): 9 ng/L (ref <18)

## 2022-12-21 LAB — DIGOXIN LEVEL: Digoxin Level: <0.2 ng/mL — ABNORMAL LOW (ref 0.8–2.0)

## 2022-12-21 LAB — TSH: TSH: 1.206 u[IU]/mL (ref 0.350–4.500)

## 2022-12-21 LAB — ETHANOL: Alcohol, Ethyl (B): <10 mg/dL (ref <10)

## 2022-12-21 MED ORDER — MORPHINE SULFATE (PF) 4 MG/ML IV SOLN
4.0000 mg | Freq: Once | INTRAVENOUS | Status: AC
  Administered 2022-12-21: 4 mg via INTRAVENOUS
  Filled 2022-12-21: qty 1

## 2022-12-21 MED ORDER — ROSUVASTATIN CALCIUM 20 MG PO TABS
20.0000 mg | ORAL_TABLET | Freq: Every day | ORAL | Status: DC
  Administered 2022-12-21: 20 mg via ORAL
  Filled 2022-12-21: qty 1

## 2022-12-21 MED ORDER — UMECLIDINIUM BROMIDE 62.5 MCG/ACT IN AEPB
1.0000 | INHALATION_SPRAY | Freq: Every day | RESPIRATORY_TRACT | Status: DC
  Administered 2022-12-22: 1 via RESPIRATORY_TRACT
  Filled 2022-12-21 (×2): qty 7

## 2022-12-21 MED ORDER — ONDANSETRON HCL 4 MG/2ML IJ SOLN: 4.0000 mg | Freq: Four times a day (QID) | INTRAMUSCULAR | Status: DC | PRN

## 2022-12-21 MED ORDER — ONDANSETRON HCL 4 MG PO TABS: 4.0000 mg | ORAL_TABLET | Freq: Four times a day (QID) | ORAL | Status: DC | PRN

## 2022-12-21 MED ORDER — DIAZEPAM 5 MG/ML IJ SOLN: 2.5000 mg | Freq: Once | INTRAMUSCULAR | Status: DC | PRN

## 2022-12-21 MED ORDER — TAMSULOSIN HCL 0.4 MG PO CAPS
0.4000 mg | ORAL_CAPSULE | Freq: Every day | ORAL | Status: DC
  Administered 2022-12-22: 0.4 mg via ORAL
  Filled 2022-12-21: qty 1

## 2022-12-21 MED ORDER — DICLOFENAC SODIUM 1 % EX GEL: 2.0000 g | Freq: Two times a day (BID) | CUTANEOUS | Status: DC | PRN

## 2022-12-21 MED ORDER — IOHEXOL 350 MG/ML SOLN
75.0000 mL | Freq: Once | INTRAVENOUS | Status: AC | PRN
  Administered 2022-12-21: 75 mL via INTRAVENOUS

## 2022-12-21 MED ORDER — FUROSEMIDE 20 MG PO TABS
20.0000 mg | ORAL_TABLET | Freq: Every day | ORAL | Status: DC
  Administered 2022-12-22: 20 mg via ORAL
  Filled 2022-12-21: qty 1

## 2022-12-21 MED ORDER — ROPINIROLE HCL 1 MG PO TABS
0.5000 mg | ORAL_TABLET | Freq: Three times a day (TID) | ORAL | Status: DC
  Administered 2022-12-21 – 2022-12-22 (×3): 0.5 mg via ORAL
  Filled 2022-12-21 (×3): qty 1

## 2022-12-21 MED ORDER — OXYCODONE-ACETAMINOPHEN 5-325 MG PO TABS
1.0000 | ORAL_TABLET | Freq: Four times a day (QID) | ORAL | Status: DC | PRN
  Administered 2022-12-21 – 2022-12-22 (×2): 1 via ORAL
  Filled 2022-12-21 (×2): qty 1

## 2022-12-21 MED ORDER — SENNOSIDES-DOCUSATE SODIUM 8.6-50 MG PO TABS: 1.0000 | ORAL_TABLET | Freq: Every evening | ORAL | Status: DC | PRN

## 2022-12-21 MED ORDER — ALPRAZOLAM 0.25 MG PO TABS: 0.2500 mg | ORAL_TABLET | Freq: Four times a day (QID) | ORAL | Status: DC | PRN

## 2022-12-21 MED ORDER — DILTIAZEM HCL ER COATED BEADS 120 MG PO CP24
120.0000 mg | ORAL_CAPSULE | Freq: Every day | ORAL | Status: DC
  Administered 2022-12-21 – 2022-12-22 (×2): 120 mg via ORAL
  Filled 2022-12-21 (×3): qty 1

## 2022-12-21 MED ORDER — NITROGLYCERIN 0.4 MG SL SUBL: 0.4000 mg | SUBLINGUAL_TABLET | SUBLINGUAL | Status: DC | PRN

## 2022-12-21 MED ORDER — ONDANSETRON HCL 4 MG/2ML IJ SOLN
4.0000 mg | Freq: Once | INTRAMUSCULAR | Status: AC
  Administered 2022-12-21: 4 mg via INTRAVENOUS
  Filled 2022-12-21: qty 2

## 2022-12-21 MED ORDER — FLUTICASONE FUROATE-VILANTEROL 100-25 MCG/ACT IN AEPB
1.0000 | INHALATION_SPRAY | Freq: Every day | RESPIRATORY_TRACT | Status: DC
  Administered 2022-12-22: 1 via RESPIRATORY_TRACT
  Filled 2022-12-21 (×2): qty 28

## 2022-12-21 MED ORDER — PANTOPRAZOLE SODIUM 40 MG PO TBEC
40.0000 mg | DELAYED_RELEASE_TABLET | Freq: Every day | ORAL | Status: DC
  Administered 2022-12-22: 40 mg via ORAL
  Filled 2022-12-21: qty 1

## 2022-12-21 MED ORDER — ACETAMINOPHEN 325 MG PO TABS
650.0000 mg | ORAL_TABLET | Freq: Four times a day (QID) | ORAL | Status: DC | PRN
  Administered 2022-12-21 – 2022-12-22 (×2): 650 mg via ORAL
  Filled 2022-12-21 (×2): qty 2

## 2022-12-21 MED ORDER — ASPIRIN 81 MG PO TBEC: 81.0000 mg | DELAYED_RELEASE_TABLET | Freq: Every day | ORAL | Status: DC

## 2022-12-21 MED ORDER — APIXABAN 5 MG PO TABS
5.0000 mg | ORAL_TABLET | Freq: Two times a day (BID) | ORAL | Status: DC
  Administered 2022-12-21 – 2022-12-22 (×2): 5 mg via ORAL
  Filled 2022-12-21 (×2): qty 1

## 2022-12-21 MED ORDER — SODIUM CHLORIDE 0.9% FLUSH
3.0000 mL | Freq: Once | INTRAVENOUS | Status: AC
  Administered 2022-12-21: 3 mL via INTRAVENOUS

## 2022-12-21 MED ORDER — CARBIDOPA-LEVODOPA 25-100 MG PO TABS
3.0000 | ORAL_TABLET | Freq: Three times a day (TID) | ORAL | Status: DC
  Administered 2022-12-21 – 2022-12-22 (×3): 3 via ORAL
  Filled 2022-12-21 (×3): qty 3

## 2022-12-21 MED ORDER — ALBUTEROL SULFATE (2.5 MG/3ML) 0.083% IN NEBU: 3.0000 mL | INHALATION_SOLUTION | Freq: Three times a day (TID) | RESPIRATORY_TRACT | Status: DC | PRN

## 2022-12-21 MED ORDER — ACETAMINOPHEN 650 MG RE SUPP: 650.0000 mg | Freq: Four times a day (QID) | RECTAL | Status: DC | PRN

## 2022-12-21 MED ORDER — MAGNESIUM OXIDE -MG SUPPLEMENT 400 (240 MG) MG PO TABS
400.0000 mg | ORAL_TABLET | Freq: Every day | ORAL | Status: DC
  Administered 2022-12-22: 400 mg via ORAL
  Filled 2022-12-21: qty 1

## 2022-12-21 NOTE — Code Documentation (Signed)
Stroke Response Nurse Documentation Code Documentation  Micheal [name]. is a 82 y.o. male arriving to [facility]  via [facility] on 12/21/22 with past medical hx of Parkinsons, Afib, Anxiety, chronic back pain, DM, HLD, HTN, CKD. On Eliquis (apixaban) daily. Code stroke was activated by EMS.   Patient from Home where he was LKW at 1130 and now complaining of L sided weakness and facial droop. Pt states he was at church this morning and suddenly became weak on L side with facial droop c/o feeling "like a big is crawling on my L side"  Stroke team at the bedside on patient arrival. Labs drawn and patient cleared for CT by Dr. Particia Nearing. Patient to CT with team. NIHSS 9, see documentation for details and code stroke times. The following imaging was completed:  CT Head and CTA. Patient is not a candidate for IV Thrombolytic due to pt on Eliquis. Patient is not a candidate for IR due to no LVO.   Care Plan: q2h vitals and neuro checks.    Bedside handoff with ED RN Rodney Booze.    Micheal [name]  Stroke Response RN

## 2022-12-21 NOTE — ED Notes (Addendum)
Pharmacy notified to send cardizem 

## 2022-12-21 NOTE — ED Provider Notes (Signed)
[place] EMERGENCY DEPARTMENT AT [facility] Provider Note   CSN: [id number] Arrival date & time: 12/21/22  1216  An emergency department physician performed an initial assessment on this suspected stroke patient at 1218.  History  Chief Complaint  Patient presents with   Code Stroke    Micheal [name]. is a 82 y.o. male.  Pt is a 82 yo male with pmhx significant for Parkinson's disease, dm, htn, cva, aneurysm, kidney stones, cad, hld, and afib on eliquis.  Pt said he developed left facial and leg numbness and tingling today while at church [phone]).  EMS was called and a code stroke was called en route.       Home Medications Prior to Admission medications   Medication Sig Start Date End Date Taking? Authorizing Provider  acetaminophen (TYLENOL) 650 MG CR tablet Take 1,300 mg by mouth every 8 (eight) hours as needed for pain.   Yes [provider]  ALPRAZolam (XANAX) 0.25 MG tablet Take 0.25 mg by mouth every 6 (six) hours as needed for anxiety.   Yes [provider]  carbidopa-levodopa (SINEMET IR) 25-100 MG tablet Take 3 tablets by mouth 3 (three) times daily. 0830, 1300, 2200 10/31/15  Yes [provider]  diltiazem (CARDIZEM CD) 120 MG 24 hr capsule Take 1 capsule (120 mg total) by mouth daily. 08/29/22  Yes Georgeanna Lea, MD  ELIQUIS 5 MG TABS tablet Take 5 mg by mouth 2 (two) times daily. 08/28/22  Yes [provider]  losartan (COZAAR) 50 MG tablet Take 25 mg by mouth daily. 09/05/22  Yes [provider]  metFORMIN (GLUCOPHAGE) 850 MG tablet Take 850 mg by mouth with breakfast, with lunch, and with evening meal.   Yes [provider]  nitroGLYCERIN (NITROSTAT) 0.4 MG SL tablet Place 0.4 mg under the tongue every 5 (five) minutes as needed for chest pain. 01/09/22  Yes [provider]  omeprazole (PRILOSEC) 40 MG capsule Take 40 mg by mouth daily.  10/21/15  Yes [provider]  rOPINIRole  (REQUIP) 0.5 MG tablet Take 0.5 mg by mouth 3 (three) times daily.   Yes [provider]  rosuvastatin (CRESTOR) 20 MG tablet Take 20 mg by mouth at bedtime.   Yes [provider]  tamsulosin (FLOMAX) 0.4 MG CAPS capsule Take 0.4 mg by mouth daily after breakfast.    Yes [provider]  albuterol (VENTOLIN HFA) 108 (90 Base) MCG/ACT inhaler Inhale 2 puffs into the lungs every 8 (eight) hours as needed for shortness of breath. 12/20/21   [provider]  aspirin EC 81 MG tablet Take 1 tablet (81 mg total) by mouth daily. Swallow whole. Patient not taking: Reported on 10/08/2022 01/09/22   Revankar, Aundra Dubin, MD  diclofenac Sodium (VOLTAREN ARTHRITIS PAIN) 1 % GEL Apply 2 g topically 2 (two) times daily as needed (pain).    [provider]  fluticasone (FLONASE) 50 MCG/ACT nasal spray Place 2 sprays into both nostrils daily.    [provider]  gabapentin (NEURONTIN) 300 MG capsule Take 300 mg by mouth 3 (three) times daily as needed (pain).    [provider]  hydrochlorothiazide (HYDRODIURIL) 25 MG tablet Take 25 mg by mouth daily. Patient not taking: Reported on 12/21/2022 05/02/19   [provider]  mirtazapine (REMERON) 7.5 MG tablet Take 7.5 mg by mouth at bedtime. 09/25/22   [provider]  Multiple Vitamin (MULTIVITAMIN) tablet Take 1 tablet by mouth daily.  [provider]  polycarbophil (FIBERCON) 625 MG tablet Take 625 mg by mouth daily.    [provider]  sildenafil (VIAGRA) 50 MG tablet Take 50 mg by mouth daily as needed for erectile dysfunction.    [provider]  testosterone cypionate (DEPOTESTOSTERONE CYPIONATE) 200 MG/ML injection Inject 200 mg into the muscle every 14 (fourteen) days. 06/17/22   [provider]  TRELEGY ELLIPTA 100-62.5-25 MCG/ACT AEPB Take 1 puff by mouth daily. 11/29/21   [provider]      Allergies    Patient has no known allergies.     Review of Systems   Review of Systems  Neurological:  Positive for numbness.  All other systems reviewed and are negative.   Physical Exam Updated Vital Signs BP 133/84   Pulse 94   Temp 98.5 F (36.9 C) (Oral)   Resp 16   Ht 5\' 6"  (1.676 m)   Wt 71.4 kg   SpO2 100%   BMI 25.41 kg/m  Physical Exam Vitals and nursing note reviewed.  Constitutional:      Appearance: Normal appearance.  HENT:     Head: Normocephalic and atraumatic.     Right Ear: External ear normal.     Left Ear: External ear normal.     Nose: Nose normal.     Mouth/Throat:     Mouth: Mucous membranes are moist.     Pharynx: Oropharynx is clear.  Eyes:     Conjunctiva/sclera: Conjunctivae normal.     Pupils: Pupils are equal, round, and reactive to light.     Comments: strabismus  Cardiovascular:     Rate and Rhythm: Normal rate. Rhythm irregular.     Pulses: Normal pulses.     Heart sounds: Normal heart sounds.  Pulmonary:     Effort: Pulmonary effort is normal.     Breath sounds: Normal breath sounds.  Abdominal:     General: Abdomen is flat. Bowel sounds are normal.     Palpations: Abdomen is soft.  Musculoskeletal:        General: Normal range of motion.     Cervical back: Normal range of motion and neck supple.  Skin:    General: Skin is warm.     Capillary Refill: Capillary refill takes less than 2 seconds.  Neurological:     Mental Status: He is alert and oriented to person, place, and time.     Comments: Tremor right arm Numbness left arm/leg  Psychiatric:        Mood and Affect: Mood is anxious.     ED Results / Procedures / Treatments   Labs (all labs ordered are listed, but only abnormal results are displayed) Labs Reviewed  PROTIME-INR - Abnormal; Notable for the following components:      Result Value   Prothrombin Time 16.8 (*)    INR 1.3 (*)    All other components within normal limits  CBC - Abnormal; Notable for the following components:   RBC 3.61 (*)     Hemoglobin 10.8 (*)    HCT 35.9 (*)    All other components within normal limits  DIFFERENTIAL - Abnormal; Notable for the following components:   Neutro Abs 8.2 (*)    All other components within normal limits  COMPREHENSIVE METABOLIC PANEL - Abnormal; Notable for the following components:   CO2 19 (*)    BUN 26 (*)    Creatinine, Ser 1.34 (*)    Total Protein 6.1 (*)  GFR, Estimated 54 (*)    All other components within normal limits  I-STAT CHEM 8, ED - Abnormal; Notable for the following components:   BUN 28 (*)    Creatinine, Ser 1.30 (*)    Calcium, Ion 1.10 (*)    Hemoglobin 10.9 (*)    HCT 32.0 (*)    All other components within normal limits  CBG MONITORING, ED - Abnormal; Notable for the following components:   Glucose-Capillary 102 (*)    All other components within normal limits  APTT  ETHANOL  DIGOXIN LEVEL    EKG EKG Interpretation  Date/Time:  Sunday Dec 21 2022 12:48:52 EDT Ventricular Rate:  112 PR Interval:    QRS Duration: 137 QT Interval:  319 QTC Calculation: 436 R Axis:   214 Text Interpretation: Right and left arm electrode reversal, interpretation assumes no reversal Atrial fibrillation Nonspecific intraventricular conduction delay Nonspecific T abnormalities, lateral leads ST elevation, consider anterior injury Since last tracing rate faster and in afib Confirmed by Jacalyn Lefevre [phone]) on 12/21/2022 1:07:37 PM  Radiology CT ANGIO HEAD NECK W WO CM (CODE STROKE)  Result Date: 12/21/2022 CLINICAL DATA:  Neuro deficit acute stroke suspected. Previous aneurysm clipping. EXAM: CT ANGIOGRAPHY HEAD AND NECK WITH AND WITHOUT CONTRAST TECHNIQUE: Multidetector CT imaging of the head and neck was performed using the standard protocol during bolus administration of intravenous contrast. Multiplanar CT image reconstructions and MIPs were obtained to evaluate the vascular anatomy. Carotid stenosis measurements (when applicable) are obtained utilizing NASCET  criteria, using the distal internal carotid diameter as the denominator. RADIATION DOSE REDUCTION: This exam was performed according to the departmental dose-optimization program which includes automated exposure control, adjustment of the mA and/or kV according to patient size and/or use of iterative reconstruction technique. CONTRAST:  75mL OMNIPAQUE IOHEXOL 350 MG/ML SOLN COMPARISON:  CT head without contrast 12/21/2022. CT of the chest 11/06/2021 FINDINGS: CTA NECK FINDINGS Aortic arch: Atherosclerotic calcifications are present at the aortic arch the great vessel origins. No focal stenosis or aneurysm is present. Right carotid system: The right common carotid artery is within normal limits. Atherosclerotic calcifications are present at the right carotid bifurcation proximal right ICA without significant stenosis relative to the more distal vessel. Mild tortuosity is present in the cervical right ICA without significant stenosis. Left carotid system: The left common carotid artery is within normal limits. The bifurcation is unremarkable. Atherosclerotic calcifications are present at the left carotid bifurcation and proximal left ICA without significant stenosis. Moderate tortuosity is present in the cervical left ICA without focal stenosis. Vertebral arteries: The left common carotid artery is the dominant vessel. Both vertebral arteries originate from the subclavian arteries without significant stenosis. No significant stenosis is present in either vertebral artery in the. Skeleton: Multilevel degenerative changes are present in the cervical spine. Uncovertebral spurring leads to left foraminal stenosis at C4-5 and right foraminal narrowing at C4-5 and C5-6. Right greater than left foraminal narrowing is present C6-7. Other neck: A 13 mm well-circumscribed hyperdense lesion is present in the right parotid gland. The glands and ducts are within normal limits bilaterally otherwise. No focal mucosal lesions are  present. No significant adenopathy is present. 12 mm hypodense nodule is present in the right lobe of thyroid. The thyroid is otherwise within normal limits. Upper chest: Centrilobular emphysematous changes are present in the lungs bilaterally. A 6.5 mm nodule is present in the medial left upper lobe, similar in size to the prior chest CT. No other focal lesions are  present. Review of the MIP images confirms the above findings CTA HEAD FINDINGS Anterior circulation: Atherosclerotic calcifications are present in the cavernous internal carotid arteries bilaterally. 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 No residual or recurrent aneurysm is present. The ICA termini are within normal limits bilaterally. The A1 and M1 segments are normal. No definite anterior communicating artery is present. The MCA bifurcations are within normal limits bilaterally. The ACA and MCA branch vessels are normal. Posterior circulation: The PICA origins are visualized and normal bilaterally. The vertebrobasilar junction and basilar artery is normal. Both posterior cerebral arteries originate from the basilar tip. The PCA branch vessels are normal bilaterally. Venous sinuses: The dural sinuses are patent. The straight sinus and deep cerebral veins are intact. Cortical veins are within normal limits. No significant vascular malformation is evident. Anatomic variants: None Review of the MIP images confirms the above findings IMPRESSION: 1. 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 No residual or recurrent aneurysm is present. 2. No other significant proximal stenosis, aneurysm, or branch vessel occlusion within the Circle of Willis. 3. Atherosclerotic changes at the carotid bifurcations and cavernous internal carotid arteries bilaterally without significant stenosis relative to the more distal vessels. 4. Tortuosity of the cervical internal carotid  arteries bilaterally without significant stenosis. This is nonspecific, but most commonly seen in the setting of hypertension. 5. Multilevel degenerative changes in the cervical spine. 6. 13 mm well-circumscribed hyperdense lesion in the right parotid gland. This most likely represents a benign lesion such as a pleomorphic adenoma or Wharton's tumor. Recommend ENT follow-up. 7. 12 mm hypodense nodule in the right lobe of the thyroid. Not clinically significant; no follow-up imaging recommended (ref: J Am Coll Radiol. 2015 Feb;12(2): 143-50). 8. A 6.5 mm nodule is present in the medial left upper lobe, similar in size to the prior chest CT. Given this is stable from CT scan of 13 months ago, additional CT scan at 6-12 months is considered optional for low-risk patients, but is recommended for high-risk patients. This recommendation follows the consensus statement: Guidelines for Management of Incidental Pulmonary Nodules Detected on CT Images: From the Fleischner Society 2017; Radiology 2017; 284:228-243. 9. Aortic Atherosclerosis (ICD10-I70.0) and Emphysema (ICD10-J43.9). Electronically Signed   By: Marin Roberts M.D.   On: 12/21/2022 12:49   CT HEAD CODE STROKE WO CONTRAST  Result Date: 12/21/2022 CLINICAL DATA:  Code stroke.  Neuro deficit, acute, stroke suspected EXAM: CT HEAD WITHOUT CONTRAST TECHNIQUE: Contiguous axial images were obtained from the base of the skull through the vertex without intravenous contrast. RADIATION DOSE REDUCTION: This exam was performed according to the departmental dose-optimization program which includes automated exposure control, adjustment of the mA and/or kV according to patient size and/or use of iterative reconstruction technique. COMPARISON:  CT head November 01, 2022. FINDINGS: Brain: Anterior right temporal lobe encephalomalacia. Small remote infarct in the right frontal cortex, similar. No evidence of acute large vascular territory infarct, acute hemorrhage, mass  lesion, midline shift or hydrocephalus. Vascular: Aneurysm clips in the anterior right middle cranial fossa. Shortness freak artifact. Skull: No acute fracture.  Right pterional craniotomy. Sinuses/Orbits: No acute findings. Other: Mastoid effusions. ASPECTS [facility] Stroke Program Early CT Score) total score (0-10 with 10 being normal): 10 IMPRESSION: 1. No evidence of acute intracranial abnormality.  ASPECTS is 10. 2. 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 Code stroke imaging results were communicated on 12/21/2022 at 12:33 pm to provider Dr. Amada Jupiter via secure  text paging. Electronically Signed   By: Feliberto Harts M.D.   On: 12/21/2022 12:33    Procedures Procedures    Medications Ordered in ED Medications  diazepam (VALIUM) injection 2.5 mg (has no administration in time range)  sodium chloride flush (NS) 0.9 % injection 3 mL (3 mLs Intravenous Given 12/21/22 1454)  iohexol (OMNIPAQUE) 350 MG/ML injection 75 mL (75 mLs Intravenous Contrast Given 12/21/22 1234)  morphine (PF) 4 MG/ML injection 4 mg (4 mg Intravenous Given 12/21/22 1448)  ondansetron (ZOFRAN) injection 4 mg (4 mg Intravenous Given 12/21/22 1448)    ED Course/ Medical Decision Making/ A&P                             Medical Decision Making Amount and/or Complexity of Data Reviewed Labs: ordered. Radiology: ordered.  Risk Prescription drug management. Decision regarding hospitalization.   This patient presents to the ED for concern of cva, this involves an extensive number of treatment options, and is a complaint that carries with it a high risk of complications and morbidity.  The differential diagnosis includes dva, tia   Co morbidities that complicate the patient evaluation  Parkinson's disease, dm, htn, cva, aneurysm, kidney stones, cad, hld, and afib on eliquis   Additional history obtained:  Additional history obtained from epic chart review External records from outside  source obtained and reviewed including EMS report   Lab Tests:  I Ordered, and personally interpreted labs.  The pertinent results include:  cbc with hgb 10.8 (hgb 12.2 in June), cmp with cr 1.34; etoh neg, inr 1.03   Imaging Studies ordered:  I ordered imaging studies including ct head/cta I independently visualized and interpreted imaging which showed  CT head:  No evidence of acute intracranial abnormality.  ASPECTS is 10.  2. Chronic anterior temporal lobe encephalomalacia and aneurysm  clips.  CTA:  Right-sided aneurysm clip partially obscures the cavernous right  internal carotid artery. No residual or recurrent aneurysm is  present.  2. No other significant proximal stenosis, aneurysm, or branch  vessel occlusion within the Circle of Willis.  3. Atherosclerotic changes at the carotid bifurcations and cavernous  internal carotid arteries bilaterally without significant stenosis  relative to the more distal vessels.  4. Tortuosity of the cervical internal carotid arteries bilaterally  without significant stenosis. This is nonspecific, but most commonly  seen in the setting of hypertension.  5. Multilevel degenerative changes in the cervical spine.  6. 13 mm well-circumscribed hyperdense lesion in the right parotid  gland. This most likely represents a benign lesion such as a  pleomorphic adenoma or Wharton's tumor. Recommend ENT follow-up.  7. 12 mm hypodense nodule in the right lobe of the thyroid. Not  clinically significant; no follow-up imaging recommended (ref: J Am  Coll Radiol. 2015 Feb;12(2): 143-50).  8. A 6.5 mm nodule is present in the medial left upper lobe, similar  in size to the prior chest CT. Given this is stable from CT scan of  13 months ago, additional CT scan at 6-12 months is considered  optional for low-risk patients, but is recommended for high-risk  patients. This recommendation follows the consensus statement:  Guidelines for Management of  Incidental Pulmonary Nodules Detected  on CT Images: From the Fleischner Society 2017; Radiology 2017;  284:228-243.  9. Aortic Atherosclerosis (ICD10-I70.0) and Emphysema (ICD10-J43.9).   I agree with the radiologist interpretation   Cardiac Monitoring:  The patient was maintained on  a cardiac monitor.  I personally viewed and interpreted the cardiac monitored which showed an underlying rhythm of: afib   Medicines ordered and prescription drug management:   I have reviewed the patients home medicines and have made adjustments as needed   Test Considered:  Ct/mri   Critical Interventions:  Code stroke   Consultations Obtained:  I requested consultation with the neurologist (Dr. Amada Jupiter),  and discussed lab and imaging findings as well as pertinent plan - as pt can't get a MRI, he recommends admission for stroke work up. Pt d/w IMTS for admission.   Problem List / ED Course:  CVA:  pt has tingling to the left side of his body.  We are unable to get a MRI as it is unknown if his aneurysm clips are MRI compatible.  Pt will get admitted for stroke work up.   Reevaluation:  After the interventions noted above, I reevaluated the patient and found that they have :improved   Social Determinants of Health:  Lives at home   Dispostion:  After consideration of the diagnostic results and the patients response to treatment, I feel that the patent would benefit from admission.    CRITICAL CARE Performed by: Jacalyn Lefevre   Total critical care time: 30 minutes  Critical care time was exclusive of separately billable procedures and treating other patients.  Critical care was necessary to treat or prevent imminent or life-threatening deterioration.  Critical care was time spent personally by me on the following activities: development of treatment plan with patient and/or surrogate as well as nursing, discussions with consultants, evaluation of patient's response  to treatment, examination of patient, obtaining history from patient or surrogate, ordering and performing treatments and interventions, ordering and review of laboratory studies, ordering and review of radiographic studies, pulse oximetry and re-evaluation of patient's condition.         Final Clinical Impression(s) / ED Diagnoses Final diagnoses:  Acute ischemic stroke (HCC)  Longstanding persistent atrial fibrillation (HCC)  On apixaban therapy    Rx / DC Orders ED Discharge Orders     None         Jacalyn Lefevre, MD 12/21/22 [phone]

## 2022-12-21 NOTE — ED Notes (Signed)
Report given to Ryan, RN.

## 2022-12-21 NOTE — ED Notes (Signed)
Pt gone to Xray 

## 2022-12-21 NOTE — H&P (Cosign Needed Addendum)
Date: 12/21/2022               Patient Name:  Micheal [name]. MRN: [id number]  DOB: 1940-09-10 Age / Sex: 82 y.o., male   PCP: Micheal Curia, MD         Medical Service: Internal Medicine Teaching Service         Attending Physician: Dr. Reymundo Poll, MD    First Contact: Dr. Karie Fetch Pager: [phone]  Second Contact: Dr. Sharrell Ku Pager: [phone]       After Hours (After 5p/  First Contact Pager: [phone]  weekends / holidays): Second Contact Pager: [phone]   Chief Complaint: code stroke, chest pain  History of Present Illness:  Patient is a 82 yo male with a PMHx of Parkinson's, Afib on eliquis, anxiety, chronic back pain, DM, HLD, HTN, CKD who presented to [facility] after having chest pain, L sided weakness and facial droop.   Wife Inetta Fermo at bedside. She was at work when he was taken from church to the hospital. Came to the hospital from church. Felt "funny" all of a sudden, had some chest discomfort, took 2 nitroglycerin pills. Felt like someone "stepping on his chest." Broke out in a cold sweat. Called 911 and EMS transported to hospital. Had some altered sensation like bugs crawling on his right leg from knee to groin. Had right shoulder pain and R hip and knee pain. Did not fall. Still has some chest discomfort, but much improved after nitroglycerine. Bit of headache. Had some vision changes, like looking through a fog. Had some difficulty speaking during this episode. Has also had prior episodes of difficulty speaking. Everything started around 11:30 A.M. Sounds like he was in his usual state of health prior to going to church this morning.  ED: Brought in by [facility] as Code Stroke. LKW 1130am. He was at church when he became suddenly weak on L side with facial droop. Arms and legs numb and tingling, "weren't moving right." NIHSS 9. CT head no acute abnormality, chronic anterior temporal lobe encephalomalacia and aneurysm clips. CTA with no LVO. Has R  sided aneurysm clip, atherosclerotic changes at carotid bifurcation and cavernous internal carotid arteries bilaterally, tortuosity of cervical internal carotid arteries bilaterally without significant stenosis. Unable to do MRI due to clips in the brain. He received zofran x1, morphine x1 in the ED. Labs notable for elevated PT/INR. CBC Hgb 10.8 with ANC 8.2. BMP Cr 1.34. Ethanol <10, CBG 102.   Home meds:  -Tylenol 1300 q8h prn -Albuterol q8h prn -Xanax 0.25 q6h prn (last filled 09/2022) -sinemet IR 25-100 TID  -cardizem 120 ER daily  -eliquis 5mg  BID  -gabapentin 300 TID PRN (not taking) -HCTZ 25 daily (not taking) -Losartan 25 daily  -metformin 850mg  TID  -remeron 7.5 QHS  -prilosec 40mg  daily  -requip 0.5mg  TID  -crestor 20mg  daily  -flomax 0.4 daily  -trelegy used in the past month.   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   titanium clips   Anxiety    Atherosclerosis of native coronary artery    Back pain    Benign enlargement of prostate    CAD (coronary artery disease)    Cerebral vascular disease    Chronic insomnia    Chronic lower back pain  Degenerative spondylolisthesis 01/08/2016   Diabetes mellitus without complication (HCC)    Diabetic neuropathy (HCC)    Esophageal reflux    Hammer toes of both feet 10/08/2016   History of kidney stones    Hyperlipidemia    Hypertension    Hypogonadism in male    Leg length discrepancy 10/08/2016   Lower extremity edema    Lumbar radiculopathy 09/04/2022   Organic erectile dysfunction    Osteoarthritis    Parkinson's disease    Persistent atrial fibrillation (HCC) 09/04/2022   Pneumonia    hx   Preop cardiovascular exam    Pure hypercholesterolemia    Stage 3b chronic kidney disease (CKD) (HCC)    Stroke (HCC)  08/04/2000   Type 2 diabetes mellitus with diabetic nephropathy (HCC)    Family History:  Mother: lung cancer Maternal aunt: diabetes   Social History:  No drug use No alcohol use Former cigarette smoker 1.5 ppd, currently chews tobacco.  Lives with wife Inetta Fermo) at home, uses a walker.   Review of Systems: A complete ROS was negative except as per HPI. No issues with urination, BM, or appetite.   Physical Exam: Blood pressure 133/84, pulse 94, temperature 98.5 F (36.9 C), temperature source Oral, resp. rate 16, height 5\' 6"  (1.676 m), weight 71.4 kg, SpO2 100 %. GEN: chronically ill-appearing man in mild distress with significant tremor in R hand. Wearing glasses.   CARDIO: irregular rhythm. Tachycardic. No m/r/g. Radial pulses intact.  PULM: CTAB. Normal WOB on RA.  ABD: soft, non-tender, non-distended. Normoactive bowel sounds.  NEURO: EOM intact. Facial sensation intact. L facial droop. Strength 4/5 in LUE, 3/5 in RUE. Strength 3/5 in LLE, 2/5 in RLE (limited by pain). Sensation intact, notes LLE feels different from RLE. Difficulty with FTN in R hand due to tremor.  MSK: Full ROM with R shoulder and R knee, reports tenderness.   EKG: personally reviewed my interpretation is Afib   CT ANGIO HEAD NECK W WO CM (CODE STROKE) Result Date: 12/21/2022 CLINICAL DATA:  Neuro deficit acute stroke suspected. Previous aneurysm clipping.  IMPRESSION: 1. 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 No residual or recurrent aneurysm is present. 2. No other significant proximal stenosis, aneurysm, or branch vessel occlusion within the Circle of Willis. 3. Atherosclerotic changes at the carotid bifurcations and cavernous internal carotid arteries bilaterally without significant stenosis relative to the more distal vessels. 4. Tortuosity of the cervical internal carotid arteries bilaterally without significant stenosis. This is nonspecific, but most  commonly seen in the setting of hypertension. 5. Multilevel degenerative changes in the cervical spine. 6. 13 mm well-circumscribed hyperdense lesion in the right parotid gland. This most likely represents a benign lesion such as a pleomorphic adenoma or Wharton's tumor. Recommend ENT follow-up. 7. 12 mm hypodense nodule in the right lobe of the thyroid. Not clinically significant; no follow-up imaging recommended (ref: J Am Coll Radiol. 2015 Feb;12(2): 143-50). 8. A 6.5 mm nodule is present in the medial left upper lobe, similar in size to the prior chest CT. Given this is stable from CT scan of 13 months ago, additional CT scan at 6-12 months is considered optional for low-risk patients, but is recommended for high-risk patients. This recommendation follows the consensus statement: Guidelines for Management of Incidental Pulmonary Nodules Detected on CT Images: From the Fleischner Society 2017; Radiology 2017; 284:228-243. 9. Aortic Atherosclerosis (ICD10-I70.0) and Emphysema (ICD10-J43.9).   CT HEAD CODE STROKE WO CONTRAST Result Date: 12/21/2022 CLINICAL  DATA:  Code stroke.  Neuro deficit, acute, stroke suspected IMPRESSION: 1. No evidence of acute intracranial abnormality.  ASPECTS is 10. 2. 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 Code stroke imaging results were communicated on 12/21/2022 at 12:33 pm to provider Dr. Amada Jupiter via secure text paging. Electronically Signed   By: Feliberto Harts M.D.   On: 12/21/2022 12:33    Assessment & Plan by Problem: Principal Problem:   Acute ischemic stroke (HCC)  #Acute ischemic stroke  Wonder if he might have had a TIA vs acute ischemic stroke that caused his L facial droop and weakness vs gradual progression of his Parkinson's. He did take nitroglycerin for his chest pain prior to arrival that could've precipitated an acute ischemic stroke event. Risk factors for stroke include CAD, HTN, HLD, DM, Afib, chewing tobacco. He is  already on eliquis for Afib, statin for his HLD, and anti-HTN. CT head with no acute abnormality and CTA with no LVO. He has a history of aneurysm clips. Brought in as code stroke on admission. Neurology following and admitted for further stroke work-up. Not a candidate for IV thrombolytic due to being on eliquis and not a candidate for IR due to no LVO. Unable to obtain MRI due to clips in the brain. He is followed by [facility]) for his Parkinson's. At his last visit in 09/2022, they noted that he was having increased gait difficulty and had a fall recently. He was brought in to his appointment in a wheelchair. He was also brought in wheelchair to most recent visit with cardiology in 10/2022.  -neurology following, appreciate assistance  -permissive HTN x 48 hours  -q2h neuro checks  -continue eliquis  -f/u echo, A1c, lipid panel, TSH -PT/OT  #Chest pain Patient reported chest pain prior to arrival that improved with nitroglycerin. Differential includes ACS (NSTEMI vs STEMI), PE, MSK pain. Less likely PE given he is on full dose anticoagulation. He is at high risk for ACS given his pre-existing CAD, HTN, mixed dyslipidemia, DM. History of L heart cath in 01/2022 with findings of distal LM 40% stenosis and Lcx 65-70% stenosis. Lcx lesion was not favorable for PCI at that time. Initial EKG in ED with Afib, possible ST elevation. Obtaining ACS work-up given his history and risk factors. He is followed by cardiology outpatient (Dr. Tomie China). Last seen in March 2024.  -troponin I x 2 hrs  -repeat EKG  -f/u CXR -f/u TSH -f/u Mg  -start mg-ox 400 daily  -continue home lasix  -continue home PRN nitroglycerin   #R knee and shoulder pain Reports new pain in his R knee and shoulder. He denies any trauma that may have precipitated this pain. Has a history of surgery in R shoulder many years ago. Most likely osteoarthritis but will obtain imaging to rule out acute fracture. Reports pain  did not improve with morphine in ED. Has voltaren gel given at home, will restart this for pain.   -f/u R knee xray -f/u R shoulder xray -f/u R hip xray  -PRN voltaren gel, tylenol, percocet 5-235 q6h  Chronic conditions:  Parkinsons: continue home sinemet and requip COPD: continue home LABA LAMA ICS  Afib: continue home eliquis and cardizem HTN: holding home anti-HTN (losartan 25) Mixed dyslipidemia: continue home crestor  GERD: continue home prilosec  DM: Glucose 102 on arrival. Will monitor sugars.  Stage 3b CKD: Cr 1.34, decreased from 1.45 11 months ago. Will trend BMP.  Anxiety: continue home PRN xanax  BPH: continue home tamsulosin   DIET: Regular IVF: None DVT PPX: on eliquis  BOWEL: PRN senna  CODE: FULL FAM COM: wife at bedside   PT/OT recs: pending  Dispo: Admit patient to Observation with expected length of stay less than 2 midnights.  Signed: Karie Fetch, MD, PGY-1 12/21/2022, 4:12 PM  Pager: [phone] After 5pm on weekdays and 1pm on weekends: On Call pager: [phone]

## 2022-12-21 NOTE — Hospital Course (Signed)
Patient is a 82 yo male with a PMHx of Parkinson's, Afib on eliquis, anxiety, chronic back pain, DM, HLD, HTN, CKD who presented to [facility] from [place] after having chest pain, L sided weakness and facial droop, suspected to be TIA in the setting of taking nitroglycerin for his chest pain.   #TIA Brought in as a code stroke on admission. Suspect a TIA caused his L facial droop and weakness after taking his nitroglycerin for chest pain prior to his arrival. Has been on nitro since about 09/2022 and only took this once before hospitalization. Outpatient doctors have been adjusting his blood pressure medications. CT head with no acute abnormality and CTA with no LVO. Not a candidate for IV thrombolytic due to being on eliquis and not a candidate for IR due to no LVO. He has a history of distal right ICA region aneurysm clipping that were stable on CT. Neurology and stroke team followed him during this admission. Unable to obtain MRI due to clips in the brain. Stroke risk factor modification labs obtained with A1c 5.7, LDL 17. Repeat CT head stable. Echo showed LVEF 55-60%, normal function on LV and normal function in RV. By time of discharge, still having some left sided extremity pain but no paresthesias or numbness/weakness. Discussed holding home nitroglycerin and losartan at discharge and discuss with outpatient cardiologist prior to restarting. Stroke team suspected TIA vs. Small ischemic stroke, unable to confirm due to inability to get MIR imaging. Etiology is drug-induced hypotension. They recommended outpatient follow-up with neurology in 8 weeks and continuing eliquis at discharge. Also recommended close follow-up with PCP due to A1c being in prediabetic range.   #CAD #HTN #Hypomagnesemia  Patient reported chest pain prior to arrival that improved with nitroglycerin. ACS (NSTEMI vs STEMI) rule out with negative troponin, repeat EKG with only Afib. Suspect chest pain due to chronic, stable CAD. CXR  wnl. TSH wnl. Repleted Mg prior to discharge. Continued on home mg-ox. Held home anti-HTN on admission and advised to hold home medications as above.   #R knee, hip, shoulder osteoarthritis  Reported new pain in his R knee and shoulder on admission. Imaging consistent with OA of hip, knee, shoulder. He had PRN voltaren gel and percocet for pain.   #Parkinson He was continued on home sinemet and requip. He is followed by [facility]) for his Parkinson's. At his last visit in 09/2022, they noted that he was having increased gait difficulty and had a fall recently. He was brought in to his appointment in a wheelchair. He was also brought in wheelchair to most recent visit with cardiology in 10/2022. Wife reports that they have home health PT/OT.   #Stage 3b CKD:  Cr stable and decreased from prior.    Chronic conditions:  Parkinsons: continue home sinemet and requip COPD: continue home LABA LAMA ICS  Afib: continue home eliquis and cardizem Mixed dyslipidemia: continue home crestor  GERD: continue home prilosec  DM: Sugars stable during hospital stay.  Anxiety: continue home PRN xanax  BPH: continue home tamsulosin

## 2022-12-21 NOTE — ED Triage Notes (Signed)
PT arrived via [facility] from church with a c/o of left facial drooping and tingling and left leg tingling. A & 0 X4. LKW: today @1130 .

## 2022-12-21 NOTE — Consult Note (Addendum)
Neurology Consultation Reason for Consult: Left facial droop Referring Physician: Para Skeans  CC: Left sided weakness  History is obtained from:patient  HPI: Micheal [name]. is a 82 y.o. male with a history of parkinsons disease, afib on eliquis, who presents with left sided paresthesia and facial weakness that started abruptly while in church today. He describes left side became weak and numb on the left side.  His weakness has improved, but he continues to be quite numb throughout his left side.  He also describes some paresthesia as well.  He does have a history of atrial fibrillation and is anticoagulated and so is not a IV tenecteplase candidate.  He has a history of aneurysmal clip is therefore not an MRI candidate.   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   titanium clips   Anxiety    Atherosclerosis of native coronary artery    Back pain    Benign enlargement of prostate    CAD (coronary artery disease)    Cerebral vascular disease    Chronic insomnia    Chronic lower back pain    Degenerative spondylolisthesis 01/08/2016   Diabetes mellitus without complication (HCC)    Diabetic neuropathy (HCC)    Esophageal reflux    Hammer toes of both feet 10/08/2016   History of kidney stones    Hyperlipidemia    Hypertension    Hypogonadism in male    Leg length discrepancy 10/08/2016   Lower extremity edema    Lumbar radiculopathy 09/04/2022   Organic erectile dysfunction    Osteoarthritis    Parkinson's disease    Persistent atrial fibrillation (HCC) 09/04/2022   Pneumonia    hx   Preop cardiovascular exam    Pure hypercholesterolemia    Stage 3b chronic kidney disease (CKD) (HCC)    Stroke (HCC) 08/04/2000   Type 2 diabetes mellitus with diabetic nephropathy (HCC)       Family History  Problem Relation Age of Onset   Lung cancer Mother    Diabetes Maternal Aunt      Social History:  reports that he quit smoking about 25 years ago. His smoking use included cigarettes. He has a 48.00 pack-year smoking history. He has never used smokeless tobacco. He reports that he does not drink alcohol and does not use drugs.   Exam: Current vital signs: There were no vitals taken for this visit. Vital signs in last 24 hours:     Physical Exam  Appears well-developed and well-nourished.   Neuro: Mental Status: Patient is awake, alert, oriented to person, place, month, year, and situation. Patient is able to give a clear and coherent history. No signs of aphasia or neglect Cranial Nerves: II: Visual Fields are full. Pupils are equal, round, and reactive to light.   III,IV, VI: EOMI without ptosis or diploplia.  V: Facial sensation is symmetric to temperature VII: Facial movement with mild left facial weakness VIII: hearing is intact to voice X: Uvula elevates symmetrically XI: Shoulder shrug is symmetric. XII: tongue is midline without atrophy or fasciculations.  Motor: He has significant tremor bilaterally, he has drift in biluateral upper extremities, L > R, he is unable to lift either leg due to pain, but with encouragement, I am anble to give some antigravity effort in both.  Sensory: Sensation is  diminished throughout the left side Cerebellar: No clear ataxia   I have reviewed labs in epic and the results pertinent to this consultation are:   I have reviewed the images obtained: CT/CTA-negative  Impression: 82 year old male with abrupt onset left hypoesthesia which is persistent. Initially with weakness as well, but this is improved.  My suspicion is that this does possibly represent a small ischemic stroke versus TIA, possibly thalamic and he will need to be admitted for secondary risk factor modification.   Recommendations: - HgbA1c,  fasting lipid panel -Repeat head CT in the a.m. - Frequent neuro checks - Echocardiogram - Prophylactic therapy-apixaban 5 mg twice daily - Risk factor modification - Telemetry monitoring - PT consult, OT consult, Speech consult -Continue home Sinemet - Stroke team to follow    Ritta Slot, MD [facility] [phone]  If 7pm- 7am, please page neurology on call as listed in AMION.

## 2022-12-21 NOTE — ED Notes (Signed)
Patient sitting up eating sandwich.  

## 2022-12-22 ENCOUNTER — Observation Stay (HOSPITAL_COMMUNITY): Payer: 59

## 2022-12-22 ENCOUNTER — Observation Stay (HOSPITAL_BASED_OUTPATIENT_CLINIC_OR_DEPARTMENT_OTHER): Payer: 59

## 2022-12-22 DIAGNOSIS — I959 Hypotension, unspecified: Secondary | ICD-10-CM | POA: Diagnosis not present

## 2022-12-22 DIAGNOSIS — I952 Hypotension due to drugs: Secondary | ICD-10-CM

## 2022-12-22 DIAGNOSIS — G459 Transient cerebral ischemic attack, unspecified: Secondary | ICD-10-CM | POA: Diagnosis not present

## 2022-12-22 LAB — CBC
HCT: 35.6 % — ABNORMAL LOW (ref 39.0–52.0)
Hemoglobin: 10.9 g/dL — ABNORMAL LOW (ref 13.0–17.0)
MCH: 28.9 pg (ref 26.0–34.0)
MCHC: 30.6 g/dL (ref 30.0–36.0)
MCV: 94.4 fL (ref 80.0–100.0)
Platelets: 243 10*3/uL (ref 150–400)
RBC: 3.77 MIL/uL — ABNORMAL LOW (ref 4.22–5.81)
RDW: 13 % (ref 11.5–15.5)
WBC: 10.9 10*3/uL — ABNORMAL HIGH (ref 4.0–10.5)
nRBC: 0 % (ref 0.0–0.2)

## 2022-12-22 LAB — BASIC METABOLIC PANEL
Anion gap: 10 (ref 5–15)
BUN: 23 mg/dL (ref 8–23)
CO2: 21 mmol/L — ABNORMAL LOW (ref 22–32)
Calcium: 8.2 mg/dL — ABNORMAL LOW (ref 8.9–10.3)
Chloride: 105 mmol/L (ref 98–111)
Creatinine, Ser: 1.26 mg/dL — ABNORMAL HIGH (ref 0.61–1.24)
GFR, Estimated: 58 mL/min — ABNORMAL LOW (ref >60)
Glucose, Bld: 135 mg/dL — ABNORMAL HIGH (ref 70–99)
Potassium: 4.3 mmol/L (ref 3.5–5.1)
Sodium: 136 mmol/L (ref 135–145)

## 2022-12-22 LAB — HEMOGLOBIN A1C
Hgb A1c MFr Bld: 5.7 % — ABNORMAL HIGH (ref 4.8–5.6)
Mean Plasma Glucose: 116.89 mg/dL

## 2022-12-22 LAB — ECHOCARDIOGRAM COMPLETE
AR max vel: 2.12 cm2
AV Area VTI: 1.83 cm2
AV Area mean vel: 2.09 cm2
AV Mean grad: 3 mmHg
AV Peak grad: 4.9 mmHg
Ao pk vel: 1.11 m/s
Calc EF: 62.8 %
S' Lateral: 2.8 cm
Single Plane A2C EF: 58 %
Single Plane A4C EF: 65.9 %

## 2022-12-22 LAB — TROPONIN I (HIGH SENSITIVITY): Troponin I (High Sensitivity): 8 ng/L (ref <18)

## 2022-12-22 MED ORDER — MAGNESIUM SULFATE 4 GM/100ML IV SOLN
4.0000 g | Freq: Once | INTRAVENOUS | Status: AC
  Administered 2022-12-22: 4 g via INTRAVENOUS
  Filled 2022-12-22: qty 100

## 2022-12-22 NOTE — ED Notes (Signed)
Ambulated patient to the bathroom

## 2022-12-22 NOTE — Progress Notes (Signed)
PT Cancellation Note  Patient Details Name: Micheal [name]. MRN: [id number] DOB: [birth year]   Cancelled Treatment:    Reason Eval/Treat Not Completed: Patient at procedure or test/unavailable. Will re-attempt later today.    [name] 12/22/2022, 9:53 AM

## 2022-12-22 NOTE — Evaluation (Signed)
Occupational Therapy Evaluation Patient Details Name: Micheal [name]. MRN: [id number] DOB: 03-09-1941 Today's Date: 12/22/2022   History of Present Illness Micheal [name]. is a 82 y.o. male with a history of parkinsons disease, afib on eliquis, who presents with left sided paresthesia and facial weakness that started abruptly while in church today. He describes left side became weak and numb on the left side. No evidence of acute intracranial abnormality. CT negative for acute changes.  Noted chronic anterior temporal lobe encephalomalacia and aneurysm clips.pmhx significant for Parkinson's disease, DM, HTN, CVA, aneurysm, kidney stones, CAD, HLD.   Clinical Impression   Pt completes simulated selfcare tasks and transfers at a min guard assist with increased time and use of the RW for support.  Pt currently also uses a right AFO to assist with foot clearance during mobility.  No weakness noted in the RUE above baseline with some weakness present in the LUE secondary to history of rotator cuff surgery.  Prior to admission, pt was living with his spouse who works full-time and HHOT was coming out to continue with strengthening and ADL modifications.  Feel he will benefit from acute care OT at this time in order to increase ADL performance and functional transfer independence.       Recommendations for follow up therapy are one component of a multi-disciplinary discharge planning process, led by the attending physician.  Recommendations may be updated based on patient status, additional functional criteria and insurance authorization.   Assistance Recommended at Discharge PRN  Patient can return home with the following A little help with bathing/dressing/bathroom;Assistance with cooking/housework;Assist for transportation;Help with stairs or ramp for entrance;A little help with walking and/or transfers    Functional Status Assessment  Patient has had a recent decline in their functional  status and demonstrates the ability to make significant improvements in function in a reasonable and predictable amount of time.  Equipment Recommendations  None recommended by OT       Precautions / Restrictions Precautions Precautions: Fall Precaution Comments: RUE tremor Required Braces or Orthoses: Other Brace Other Brace: Pre-fab off the shelf R AFO Restrictions Weight Bearing Restrictions: No      Mobility Bed Mobility Overal bed mobility: Needs Assistance Bed Mobility: Supine to Sit     Supine to sit: Min assist     General bed mobility comments: Assist for bringing trunk up to sitting.    Transfers Overall transfer level: Needs assistance Equipment used: Rolling walker (2 wheels) Transfers: Sit to/from Stand, Bed to chair/wheelchair/BSC Sit to Stand: Min guard     Step pivot transfers: Min guard     General transfer comment: Mod instructional cueing to square up to the surface before sitting and to bring the RW back with him.  Pt tries to push the RW to the side and then turn and sit down instead.      Balance Overall balance assessment: Needs assistance Sitting-balance support: Feet supported Sitting balance-Leahy Scale: Fair     Standing balance support: Bilateral upper extremity supported, During functional activity Standing balance-Leahy Scale: Poor Standing balance comment: Pt needs UE support during mobility.                           ADL either performed or assessed with clinical judgement   ADL Overall ADL's : Needs assistance/impaired Eating/Feeding: Set up;Sitting Eating/Feeding Details (indicate cue type and reason): drink from cup, Per his spouse he has weighted utensils at  home he's trying that therapy gave him. Grooming: Sitting;Wash/dry hands;Wash/dry face;Supervision/safety   Upper Body Bathing: Sitting;Set up Upper Body Bathing Details (indicate cue type and reason): simulated Lower Body Bathing: Sit to/from stand;Min  guard Lower Body Bathing Details (indicate cue type and reason): simulated Upper Body Dressing : Set up;Sitting Upper Body Dressing Details (indicate cue type and reason): simulated Lower Body Dressing: Sit to/from stand;Min guard   Toilet Transfer: Ambulation;Regular Toilet;Grab bars;Min guard Toilet Transfer Details (indicate cue type and reason): simulated         Functional mobility during ADLs: Min guard;Rolling walker (2 wheels) General ADL Comments: Pt and spouse report that he is getting OT and PT at home currently.  Therapist brought a grab bar to be placed on the shower to assist with sit to stand from the toilet and pt already has a shower seat.  Adaptive weighted utensils are also being utilized secondary to increased tremor in the right hand greater than the left.     Vision Baseline Vision/History: 1 Wears glasses Ability to See in Adequate Light: 0 Adequate Patient Visual Report: No change from baseline Vision Assessment?: No apparent visual deficits     Perception  WFL   Praxis  [facility]    Pertinent Vitals/Pain Pain Assessment Pain Assessment: No/denies pain     Hand Dominance Right   Extremity/Trunk Assessment Upper Extremity Assessment Upper Extremity Assessment: RUE deficits/detail;LUE deficits/detail RUE Deficits / Details: tremor more significant in the RUE compared to left but strength 4/5 throughout with gross testing RUE Coordination: decreased fine motor;decreased gross motor LUE Deficits / Details: Shoulder flexion 2-/10, elbow flexion/extension 3+/4, grip 3+/4. History of rotator cuff surgery and limited shoulder movement LUE Coordination: decreased fine motor;decreased gross motor   Lower Extremity Assessment Lower Extremity Assessment: Defer to PT evaluation   Cervical / Trunk Assessment Cervical / Trunk Assessment: Kyphotic   Communication Communication Communication: No difficulties   Cognition Arousal/Alertness: Awake/alert Behavior  During Therapy: WFL for tasks assessed/performed Overall Cognitive Status: Within Functional Limits for tasks assessed                                 General Comments: Pt alert and oriented X3, able to state that he took too many nitroglycerine in church for chest pain.                Home Living Family/patient expects to be discharged to:: Private residence Living Arrangements: Spouse/significant other Available Help at Discharge: Available PRN/intermittently (spouse is still working so pt is alone for up to 12 hours a day with her schedule) Type of Home: House Home Access: Stairs to enter;Ramped entrance     Home Layout: One level     Bathroom Shower/Tub: Chief Strategy Officer: Standard Bathroom Accessibility: Yes How Accessible: Accessible via walker Home Equipment: Shower seat;Hand held shower head;Electric scooter;Grab bars - tub/shower;Rollator (4 wheels);Cane - quad   Additional Comments: uses rollator at home      Prior Functioning/Environment Prior Level of Function : Needs assist;Independent/Modified Independent             Mobility Comments: modified independent with mobility ADLs Comments: Spouse would assist with some LB dressing        OT Problem List: Decreased strength;Decreased activity tolerance;Impaired balance (sitting and/or standing);Decreased knowledge of use of DME or AE;Decreased coordination;Impaired UE functional use      OT Treatment/Interventions: Self-care/ADL training;Balance training;Therapeutic activities;DME and/or  AE instruction;Therapeutic exercise;Patient/family education;Neuromuscular education    OT Goals(Current goals can be found in the care plan section) Acute Rehab OT Goals Patient Stated Goal: Pt did not state this session OT Goal Formulation: With patient/family Time For Goal Achievement: 01/05/23 Potential to Achieve Goals: Good  OT Frequency: Min 2X/week       AM-PAC OT "6 Clicks"  Daily Activity     Outcome Measure Help from another person eating meals?: A Little Help from another person taking care of personal grooming?: A Little Help from another person toileting, which includes using toliet, bedpan, or urinal?: A Little Help from another person bathing (including washing, rinsing, drying)?: A Little Help from another person to put on and taking off regular upper body clothing?: A Little Help from another person to put on and taking off regular lower body clothing?: A Little 6 Click Score: 18   End of Session Equipment Utilized During Treatment: Rolling walker (2 wheels);Other (comment) (R AFO) Nurse Communication: Mobility status  Activity Tolerance: Patient tolerated treatment well Patient left: in bed;with call bell/phone within reach  OT Visit Diagnosis: Unsteadiness on feet (R26.81);Muscle weakness (generalized) (M62.81)                Time: 4098-1191 OT Time Calculation (min): 41 min Charges:  OT General Charges $OT Visit: 1 Visit OT Evaluation $OT Eval Moderate Complexity: 1 Mod OT Treatments $Self Care/Home Management : 23-37 mins Perrin Maltese, OTR/L Acute Rehabilitation Services  Office [phone] 12/22/2022

## 2022-12-22 NOTE — Discharge Summary (Cosign Needed Addendum)
Name: Micheal [name]. MRN: [id number] DOB: 08-12-1940 82 y.o. PCP: Simone Curia, MD  Date of Admission: 12/21/2022 12:16 PM Date of Discharge: 12/22/2022 Attending Physician: Dr. Antony Contras  Discharge Diagnosis: Principal Problem:   Drug-induced hypotension Active Problems:   CAD (coronary artery disease)   Cerebral vascular disease   Stage 3b chronic kidney disease (CKD) (HCC)   History of stroke   Type 2 diabetes mellitus with diabetic nephropathy (HCC)   Persistent atrial fibrillation [facility])    Discharge Medications: Allergies as of 12/22/2022   No Known Allergies      Medication List     STOP taking these medications    losartan 50 MG tablet Commonly known as: COZAAR   nitroGLYCERIN 0.4 MG SL tablet Commonly known as: NITROSTAT   sildenafil 50 MG tablet Commonly known as: VIAGRA   testosterone cypionate 200 MG/ML injection Commonly known as: DEPOTESTOSTERONE CYPIONATE       TAKE these medications    acetaminophen 500 MG tablet Commonly known as: TYLENOL Take 1,000 mg by mouth daily.   albuterol 108 (90 Base) MCG/ACT inhaler Commonly known as: VENTOLIN HFA Inhale 2 puffs into the lungs every 8 (eight) hours as needed for shortness of breath.   ALPRAZolam 0.25 MG tablet Commonly known as: XANAX Take 0.25 mg by mouth every 6 (six) hours as needed for anxiety.   carbidopa-levodopa 25-100 MG tablet Commonly known as: SINEMET IR Take 3 tablets by mouth 3 (three) times daily. 0830, 1300, 2200   diltiazem 120 MG 24 hr capsule Commonly known as: CARDIZEM CD Take 1 capsule (120 mg total) by mouth daily.   Eliquis 5 MG Tabs tablet Generic drug: apixaban Take 5 mg by mouth 2 (two) times daily.   fluticasone 50 MCG/ACT nasal spray Commonly known as: FLONASE Place 2 sprays into both nostrils daily.   furosemide 20 MG tablet Commonly known as: LASIX Take 20 mg by mouth daily.   gabapentin 300 MG capsule Commonly known as: NEURONTIN Take 300 mg  by mouth 3 (three) times daily as needed (pain).   magnesium oxide 400 (240 Mg) MG tablet Commonly known as: MAG-OX Take 400 mg by mouth daily.   metFORMIN 850 MG tablet Commonly known as: GLUCOPHAGE Take 850 mg by mouth with breakfast, with lunch, and with evening meal.   mirtazapine 7.5 MG tablet Commonly known as: REMERON Take 7.5 mg by mouth at bedtime.   multivitamin tablet Take 1 tablet by mouth daily.   omeprazole 40 MG capsule Commonly known as: PRILOSEC Take 40 mg by mouth daily.   polycarbophil 625 MG tablet Commonly known as: FIBERCON Take 625 mg by mouth daily.   rOPINIRole 0.5 MG tablet Commonly known as: REQUIP Take 0.5 mg by mouth 3 (three) times daily.   rosuvastatin 20 MG tablet Commonly known as: CRESTOR Take 20 mg by mouth at bedtime.   tamsulosin 0.4 MG Caps capsule Commonly known as: FLOMAX Take 0.4 mg by mouth daily after breakfast.   Trelegy Ellipta 100-62.5-25 MCG/ACT Aepb Generic drug: Fluticasone-Umeclidin-Vilant Take 1 puff by mouth daily.   Voltaren Arthritis Pain 1 % Gel Generic drug: diclofenac Sodium Apply 2 g topically 2 (two) times daily as needed (pain).        Disposition and follow-up:   Micheal W Ferraris Jr. was discharged from [facility] in Stable condition.  At the hospital follow up visit please address:  1.  Follow-up: a. Chest pain and HTN and whether or not to restart  his nitroglycerin or other anti-HTN. These were held at time of discharge.   b. Any new weakness or stroke-like symptoms   c. Close follow-up with PCP for A1c 5.7.   d. ENT follow-up for hyperdense lesion in R parotid gland found on CTA e. Repeat CT chest scan at 6-12 months for nodule in medial left upper lobe on CT chest    2.  Labs / imaging needed at time of follow-up: CBC, BMP  3.  Pending labs/ test needing follow-up: none  Follow-up Appointments:  Follow-up Information     Mariam Dollar, Amy, PA-C. Go to.   Specialty:  General Practice Why: Make follow-up appointment with neurology in 8 weeks Contact information: [address] [place] [address] [phone]         Simone Curia, MD. Schedule an appointment as soon as possible for a visit.   Specialty: Internal Medicine Why: Make an appointment with your PCP for a hospital follow-up visit in 1-2 weeks. Contact information: [address] [place] [address] [phone]         Revankar, Aundra Dubin, MD. Go to.   Specialty: Cardiology Why: Go to your next follow-up appointment with cardiology. Contact information: [address] [place] [address] [phone]               Please follow-up with your cardiologist, neurologist and PCP.   Hospital Course by problem list: Patient is a 82 yo male with a PMHx of Parkinson's, Afib on eliquis, anxiety, chronic back pain, DM, HLD, HTN, CKD who presented to [facility] from [place] after having chest pain, L sided weakness and facial droop, suspected to be TIA in the setting of taking nitroglycerin for his chest pain.   #stroke like symptoms; suspected symptomatic hypotension  Brought in as a code stroke on admission. Suspect a TIA caused his L facial droop and weakness after taking his nitroglycerin for chest pain prior to his arrival. Has been on nitro since about 09/2022 and only took this once before hospitalization. Outpatient doctors have been adjusting his blood pressure medications. CT head with no acute abnormality and CTA with no LVO. Not a candidate for IV thrombolytic due to being on eliquis and not a candidate for IR due to no LVO. He has a history of distal right ICA region aneurysm clipping that were stable on CT. Neurology and stroke team followed him during this admission. Unable to obtain MRI due to clips in the brain. Stroke risk factor modification labs obtained with A1c 5.7, LDL 17. Repeat CT head stable. Echo showed LVEF 55-60%, normal function on LV and  normal function in RV. By time of discharge, still having some left sided extremity pain but no paresthesias or numbness/weakness. Discussed holding home nitroglycerin and losartan at discharge and discuss with outpatient cardiologist prior to restarting. Stroke team suspected TIA vs. Small ischemic stroke, unable to confirm due to inability to get MIR imaging. Etiology is drug-induced hypotension. They recommended outpatient follow-up with neurology in 8 weeks and continuing eliquis at discharge. Also recommended close follow-up with PCP due to A1c being in prediabetic range.   #CAD #HTN #Hypomagnesemia  Patient reported chest pain prior to arrival that improved with nitroglycerin. ACS (NSTEMI vs STEMI) rule out with negative troponin, repeat EKG with only Afib. Suspect chest pain due to chronic, stable CAD. CXR wnl. TSH wnl. Repleted Mg prior to discharge. Continued on home mg-ox. Held home anti-HTN on admission and advised to  hold home medications as above.   #R knee, hip, shoulder osteoarthritis  Reported new pain in his R knee and shoulder on admission. Imaging consistent with OA of hip, knee, shoulder. He had PRN voltaren gel and percocet for pain.   #Parkinson He was continued on home sinemet and requip. He is followed by [facility]) for his Parkinson's. At his last visit in 09/2022, they noted that he was having increased gait difficulty and had a fall recently. He was brought in to his appointment in a wheelchair. He was also brought in wheelchair to most recent visit with cardiology in 10/2022. Wife reports that they have home health PT/OT.   #Stage 3b CKD:  Cr stable and decreased from prior.    Chronic conditions:  Parkinsons: continue home sinemet and requip COPD: continue home LABA LAMA ICS  Afib: continue home eliquis and cardizem Mixed dyslipidemia: continue home crestor  GERD: continue home prilosec  DM: Sugars stable during hospital stay.  Anxiety:  continue home PRN xanax  BPH: continue home tamsulosin    Subjective: Patient evaluated at bedside this AM. He is feeling better this morning but still having some left sided extremity pain without any paresthesias or numbness/weakness. Pain medications are working well for his pain. Went for echo but needed new IV for contrast.    Has been on nitro since about 09/2022 and only took this once before hospitalization. Has been on HCTZ, losartan, diltiazem.  Discharge Vitals:   BP 120/64 (BP Location: Right Arm)   Pulse 96   Temp 97.9 F (36.6 C) (Oral)   Resp 16   Ht 5\' 6"  (1.676 m)   Wt 71.4 kg   SpO2 97%   BMI 25.41 kg/m  Discharge exam: GEN: chronically ill-appearing man in mild distress with significant tremor in R hand. Wearing glasses.   CARDIO: irregular rhythm. Tachycardic. No m/r/g. Radial pulses intact.  PULM: CTAB. Normal WOB on RA.  ABD: soft, non-tender, non-distended. Normoactive bowel sounds.  NEURO: EOM intact. Facial sensation intact. Minimal L facial droop. Strength 4/5 in LUE, 4/5 in RUE. Strength 4/5 in LLE, 4/5 in RLE (limited by pain). Sensation intact bilaterally in LE.  MSK: Full ROM with R shoulder and R knee, reports tenderness.   Pertinent Labs, Studies, and Procedures:     Latest Ref Rng & Units 12/22/2022    3:00 AM 12/21/2022   12:29 PM 12/21/2022   12:18 PM  CBC  WBC 4.0 - 10.5 K/uL 10.9   10.5   Hemoglobin 13.0 - 17.0 g/dL 16.1  09.6  04.5   Hematocrit 39.0 - 52.0 % 35.6  32.0  35.9   Platelets 150 - 400 K/uL 243   251        Latest Ref Rng & Units 12/22/2022    3:00 AM 12/21/2022   12:29 PM 12/21/2022   12:18 PM  CMP  Glucose 70 - 99 mg/dL 409  98  99   BUN 8 - 23 mg/dL 23  28  26    Creatinine 0.61 - 1.24 mg/dL 8.11  9.14  7.82   Sodium 135 - 145 mmol/L 136  141  139   Potassium 3.5 - 5.1 mmol/L 4.3  4.3  4.3   Chloride 98 - 111 mmol/L 105  106  105   CO2 22 - 32 mmol/L 21   19   Calcium 8.9 - 10.3 mg/dL 8.2   8.9   Total Protein 6.5 - 8.1  g/dL  6.1   Total Bilirubin 0.3 - 1.2 mg/dL   0.8   Alkaline Phos 38 - 126 U/L   56   AST 15 - 41 U/L   15   ALT 0 - 44 U/L   8     CT HEAD WO CONTRAST ( ) Result Date: 12/22/2022 CLINICAL DATA:  82 year old male code stroke presentation yesterday. History of treated right ICA region aneurysm(s).  IMPRESSION: 1. Stable non contrast CT appearance of the brain. No acute or evolving infarct identified. 2. Sequelae of distal right ICA region aneurysm clipping with chronic regional frontotemporal encephalomalacia. Electronically Signed   By: Odessa Fleming M.D.   On: 12/22/2022 06:54   DG Shoulder Right Result Date: 12/21/2022 IMPRESSION: 1. Postsurgical and degenerative changes of the right shoulder. No acute fracture. Electronically Signed   By: Sharlet Salina M.D.   On: 12/21/2022 16:55   DG Knee 1-2 Views Right Result Date: 12/21/2022 IMPRESSION: 1. Mild patellofemoral compartmental osteoarthritis. 2. No acute bony abnormality. Electronically Signed   By: Sharlet Salina M.D.   On: 12/21/2022 16:54   DG HIP UNILAT WITH PELVIS 1V RIGHT Result Date: 12/21/2022 IMPRESSION: 1. No acute displaced fracture. 2. Mild bilateral hip osteoarthritis. Electronically Signed   By: Sharlet Salina M.D.   On: 12/21/2022 16:54   DG Chest 1 View Result Date: 12/21/2022 IMPRESSION: 1. No acute intrathoracic process. Electronically Signed   By: Sharlet Salina M.D.   On: 12/21/2022 16:51   CT ANGIO HEAD NECK W WO CM (CODE STROKE) Result Date: 12/21/2022 IMPRESSION: 1. 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 No residual or recurrent aneurysm is present. 2. No other significant proximal stenosis, aneurysm, or branch vessel occlusion within the Circle of Willis. 3. Atherosclerotic changes at the carotid bifurcations and cavernous internal carotid arteries bilaterally without significant stenosis relative to the more distal vessels. 4. Tortuosity of the cervical internal  carotid arteries bilaterally without significant stenosis. This is nonspecific, but most commonly seen in the setting of hypertension. 5. Multilevel degenerative changes in the cervical spine. 6. 13 mm well-circumscribed hyperdense lesion in the right parotid gland. This most likely represents a benign lesion such as a pleomorphic adenoma or Wharton's tumor. Recommend ENT follow-up. 7. 12 mm hypodense nodule in the right lobe of the thyroid. Not clinically significant; no follow-up imaging recommended (ref: J Am Coll Radiol. 2015 Feb;12(2): 143-50). 8. A 6.5 mm nodule is present in the medial left upper lobe, similar in size to the prior chest CT. Given this is stable from CT scan of 13 months ago, additional CT scan at 6-12 months is considered optional for low-risk patients, but is recommended for high-risk patients. This recommendation follows the consensus statement: Guidelines for Management of Incidental Pulmonary Nodules Detected on CT Images: From the Fleischner Society 2017; Radiology 2017; 284:228-243. 9. Aortic Atherosclerosis (ICD10-I70.0) and Emphysema (ICD10-J43.9). Electronically Signed   By: Marin Roberts M.D.   On: 12/21/2022 12:49   CT HEAD CODE STROKE WO CONTRAST Result Date: 12/21/2022 IMPRESSION: 1. No evidence of acute intracranial abnormality.  ASPECTS is 10. 2. 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 Code stroke imaging results were communicated on 12/21/2022 at 12:33 pm to provider Dr. Amada Jupiter via secure text paging. Electronically Signed   By: Feliberto Harts M.D.   On: 12/21/2022 12:33   ECHOCARDIOGRAM COMPLETE Result Date: 12/22/2022 IMPRESSIONS  1. Left ventricular ejection fraction, by estimation, is 55 to 60%. The left ventricle has normal function.  The left ventricle has no regional wall motion abnormalities. Left ventricular diastolic function could not be evaluated.  2. Right ventricular systolic function is normal. The right ventricular  size is normal.  3. The mitral valve is normal in structure. Mild mitral valve regurgitation. No evidence of mitral stenosis.  4. The aortic valve is grossly normal. There is mild calcification of the aortic valve. Aortic valve regurgitation is not visualized.   FINDINGS   Left Ventricle: Left ventricular ejection fraction, by estimation, is 55  to 60%. The left ventricle has normal function. The left ventricle has no regional wall motion abnormalities. The left ventricular internal cavity size was normal in size. There is borderline left ventricular hypertrophy. Left ventricular diastolic function could not be evaluated due to atrial fibrillation. Left ventricular diastolic function could not be evaluated.  Right Ventricle: The right ventricular size is normal. Right vetricular  wall thickness was not well visualized. Right ventricular systolic  function is normal.  Left Atrium: Left atrial size was normal in size.  Right Atrium: Right atrial size was normal in size.  Pericardium: There is no evidence of pericardial effusion.  Mitral Valve: The mitral valve is normal in structure. Mild mitral annular calcification. Mild mitral valve regurgitation. No evidence of mitral valve stenosis.  Tricuspid Valve: The tricuspid valve is normal in structure. Tricuspid  valve regurgitation is trivial. No evidence of tricuspid stenosis.  Aortic Valve: The aortic valve is grossly normal. There is mild  calcification of the aortic valve. Aortic valve regurgitation is not  visualized. Aortic valve mean gradient measures 3.0 mmHg. Aortic valve peak gradient measures 4.9 mmHg. Aortic valve  area, by VTI measures 1.83 cm.  Pulmonic Valve: The pulmonic valve was not well visualized. Pulmonic valve regurgitation is trivial. No evidence of pulmonic stenosis.  Aorta: The aortic root and ascending aorta are structurally normal, with no evidence of dilitation.  IAS/Shunts: The atrial septum is grossly normal.  Jodelle Red MD Electronically signed by Jodelle Red MD Signature Date/Time: 12/22/2022/1:04:18 PM    Final     Discharge Instructions: Discharge Instructions     Call MD for:  difficulty breathing, headache or visual disturbances   Complete by: As directed    Call MD for:  extreme fatigue   Complete by: As directed    Call MD for:  hives   Complete by: As directed    Call MD for:  persistant dizziness or light-headedness   Complete by: As directed    Call MD for:  persistant nausea and vomiting   Complete by: As directed    Call MD for:  redness, tenderness, or signs of infection (pain, swelling, redness, odor or green/yellow discharge around incision site)   Complete by: As directed    Call MD for:  severe uncontrolled pain   Complete by: As directed    Call MD for:  temperature >100.4   Complete by: As directed    Diet - low sodium heart healthy   Complete by: As directed    Increase activity slowly   Complete by: As directed         Discharge Instructions      To Micheal [name]. or their caretakers,  They were admitted to [facility] on 12/21/2022 for evaluation and treatment of:  Principal Problem:   Drug-induced hypotension Active Problems:   CAD (coronary artery disease)   Cerebral vascular disease   Stage 3b chronic kidney disease (CKD) (HCC)  History of stroke   Type 2 diabetes mellitus with diabetic nephropathy (HCC)   Persistent atrial fibrillation (HCC)  The evaluation suggested drug-induced hypotension due to nitroglycerin that could've caused a "mini-stroke" (TIA) vs a small ischemic stroke. Unfortunately, cannot confirm if there was a stroke with MRI due to the clips in his brain.   They were discharged from the hospital on 12/22/22. I recommend the following after leaving the hospital:  Stop taking nitroglycerin, losartan and viagra due to the increased risk of lowering your blood pressure too much and potentially  causing damage to your brain or heart. Follow-up with your cardiologist before restarting these medications.   Follow-up with your PCP in 1-2 weeks after this hospitalization. Follow-up your diabetes closely with PCP (A1c 5.7). Follow-up with your neurologist in 8 weeks. Continue to follow-up with your cardiologist.   Karie Fetch MD, PGY-1  12/22/2022, 1:36 PM

## 2022-12-22 NOTE — ED Notes (Signed)
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 The dural sinuses are patent. The straight sinus and  deep cerebral veins are intact. Cortical veins are within normal limits. No significant vascular malformation is evident. Anatomic variants: None Review of the MIP images confirms the above findings IMPRESSION: 1. 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 No residual or recurrent aneurysm is present. 2. No other significant proximal stenosis, aneurysm, or branch vessel occlusion within the Circle of Willis. 3. Atherosclerotic changes at the carotid bifurcations and cavernous internal carotid arteries bilaterally without significant stenosis relative to the more distal vessels. 4. Tortuosity of the cervical internal carotid arteries  bilaterally without significant stenosis. This is nonspecific, but most commonly seen in the setting of hypertension. 5. Multilevel degenerative changes in the cervical spine. 6. 13 mm well-circumscribed hyperdense lesion in the right parotid gland. This most likely represents a benign lesion such as a pleomorphic adenoma or Wharton's tumor. Recommend ENT follow-up. 7. 12 mm hypodense nodule in the right lobe of the thyroid. Not clinically significant; no follow-up imaging recommended (ref: J Am Coll Radiol. 2015 Feb;12(2): 143-50). 8. A 6.5 mm nodule is present in the medial left upper lobe, similar in size to the prior chest CT. Given this is stable from CT scan of 13 months ago, additional CT scan at 6-12 months is considered optional for low-risk patients, but is recommended for high-risk patients. This recommendation follows the consensus statement: Guidelines for Management of Incidental Pulmonary Nodules Detected on CT Images: From the Fleischner Society 2017; Radiology 2017; 284:228-243. 9. Aortic Atherosclerosis (ICD10-I70.0) and Emphysema (ICD10-J43.9). Electronically Signed   By: Marin Roberts M.D.   On: 12/21/2022 12:49   CT HEAD CODE STROKE WO CONTRAST  Result Date: 12/21/2022 CLINICAL DATA:  Code stroke.  Neuro deficit, acute, stroke suspected EXAM: CT HEAD WITHOUT CONTRAST TECHNIQUE: Contiguous axial images were obtained from the base of the skull through the vertex without intravenous contrast. RADIATION DOSE REDUCTION: This exam was performed according to the departmental dose-optimization program which includes automated exposure control, adjustment of the mA and/or kV according to patient size and/or use of iterative reconstruction technique. COMPARISON:  CT head November 01, 2022. FINDINGS: Brain: Anterior right temporal lobe encephalomalacia. Small remote infarct in the right frontal cortex, similar. No evidence of acute large vascular territory infarct, acute hemorrhage, mass lesion,  midline shift or hydrocephalus. Vascular: Aneurysm clips in the anterior right middle cranial fossa. Shortness freak artifact. Skull: No acute fracture.  Right pterional craniotomy. Sinuses/Orbits: No acute findings. Other: Mastoid effusions. ASPECTS [facility] Stroke Program Early CT Score) total score (0-10 with 10 being normal): 10 IMPRESSION: 1. No evidence of acute intracranial abnormality.  ASPECTS is 10. 2. 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 Code stroke imaging results were communicated on 12/21/2022 at 12:33 pm to provider Dr. Amada Jupiter via secure text paging. Electronically Signed   By: Feliberto Harts M.D.   On: 12/21/2022 12:33    Pending Labs Unresulted Labs (From admission, onward)    None       Vitals/Pain Today's Vitals   12/22/22 1304 12/22/22 1400 12/22/22 1401 12/22/22 1434  BP: 127/88 (!) 97/41    Pulse: 84 91    Resp: (!) 101 13    Temp: 97.6 F (36.4 C)   98 F (36.7 C)  TempSrc: Oral   Oral  SpO2: 99% 97%    Weight:      Height:      PainSc:   0-No pain 3     Isolation Precautions No active isolations  Medications Medications  acetaminophen (TYLENOL)  tablet 650 mg (650 mg Oral Given 12/22/22 0937)    Or  acetaminophen (TYLENOL) suppository 650 mg ( Rectal See Alternative 12/22/22 0937)  senna-docusate (Senokot-S) tablet 1 tablet (has no administration in time range)  ondansetron (ZOFRAN) tablet 4 mg (has no administration in time range)    Or  ondansetron (ZOFRAN) injection 4 mg (has no administration in time range)  albuterol (PROVENTIL) (2.5 MG/3ML) 0.083% nebulizer solution 3 mL (has no administration in time range)  ALPRAZolam (XANAX) tablet 0.25 mg (has no administration in time range)  carbidopa-levodopa (SINEMET IR) 25-100 MG per tablet immediate release 3 tablet (3 tablets Oral Given 12/22/22 0937)  diclofenac Sodium (VOLTAREN) 1 % topical gel 2 g (has no administration in time range)  diltiazem (CARDIZEM CD) 24 hr  capsule 120 mg (120 mg Oral Given 12/22/22 1013)  apixaban (ELIQUIS) tablet 5 mg (5 mg Oral Given 12/22/22 0937)  furosemide (LASIX) tablet 20 mg (20 mg Oral Given 12/22/22 0938)  magnesium oxide (MAG-OX) tablet 400 mg (400 mg Oral Given 12/22/22 1014)  pantoprazole (PROTONIX) EC tablet 40 mg (40 mg Oral Given 12/22/22 0938)  rOPINIRole (REQUIP) tablet 0.5 mg (0.5 mg Oral Given 12/22/22 0938)  rosuvastatin (CRESTOR) tablet 20 mg (20 mg Oral Given 12/21/22 2155)  tamsulosin (FLOMAX) capsule 0.4 mg (0.4 mg Oral Given 12/22/22 0942)  fluticasone furoate-vilanterol (BREO ELLIPTA) 100-25 MCG/ACT 1 puff (1 puff Inhalation Given 12/22/22 0943)    And  umeclidinium bromide (INCRUSE ELLIPTA) 62.5 MCG/ACT 1 puff (1 puff Inhalation Given 12/22/22 0944)  oxyCODONE-acetaminophen (PERCOCET/ROXICET) 5-325 MG per tablet 1 tablet (1 tablet Oral Given 12/22/22 0032)  sodium chloride flush (NS) 0.9 % injection 3 mL (3 mLs Intravenous Given 12/21/22 1454)  iohexol (OMNIPAQUE) 350 MG/ML injection 75 mL (75 mLs Intravenous Contrast Given 12/21/22 1234)  morphine (PF) 4 MG/ML injection 4 mg (4 mg Intravenous Given 12/21/22 1448)  ondansetron (ZOFRAN) injection 4 mg (4 mg Intravenous Given 12/21/22 1448)  magnesium sulfate IVPB 4 g 100 mL (0 g Intravenous Stopped 12/22/22 1315)    Mobility walks with device     Focused Assessments Cardiac Assessment Handoff:  Cardiac Rhythm: Atrial fibrillation No results found for: "CKTOTAL", "CKMB", "CKMBINDEX", "TROPONINI" Lab Results  Component Value Date   DDIMER 0.42 05/05/2019   Does the Patient currently have chest pain? No    R Recommendations: See Admitting Provider Note  Report given to:   Additional Notes: Ambulates with a walker. Pt has braces he wears on his legs and is able to ambulate safely.

## 2022-12-22 NOTE — Evaluation (Signed)
Physical Therapy Evaluation Patient Details Name: Micheal [name]. MRN: [id number] DOB: Feb 04, 1941 Today's Date: 12/22/2022  History of Present Illness  Micheal [name]. is a 82 y.o. male with a history of parkinsons disease, afib on eliquis, who presents with left sided paresthesia and facial weakness that started abruptly while in church today. He describes left side became weak and numb on the left side. No evidence of acute intracranial abnormality. CT negative for acute changes.  Noted chronic anterior temporal lobe encephalomalacia and aneurysm clips.pmhx significant for Parkinson's disease, DM, HTN, CVA, aneurysm, kidney stones, CAD, HLD.   Clinical Impression  Patient received seated on edge of stretcher eating lunch. Wife present in room. Patient is agreeable to PT assessment. He transferred to straight back chair to don socks and shoes with AFO for gait with supervision. Transfers sit to stand with supervision. Ambulated 150 feet with RW and supervision. He demonstrates typical Parkinson's gait, but no loss of balance or safety issues noted. He reports getting HHPT currently and will continue to benefit from skilled PT as he has pain in right side of body currently.           Recommendations for follow up therapy are one component of a multi-disciplinary discharge planning process, led by the attending physician.  Recommendations may be updated based on patient status, additional functional criteria and insurance authorization.  Follow Up Recommendations       Assistance Recommended at Discharge PRN  Patient can return home with the following  Assistance with cooking/housework;Assist for transportation;Help with stairs or ramp for entrance    Equipment Recommendations None recommended by PT  Recommendations for Other Services       Functional Status Assessment Patient has had a recent decline in their functional status and demonstrates the ability to make significant  improvements in function in a reasonable and predictable amount of time.     Precautions / Restrictions Precautions Precautions: Fall Precaution Comments: RUE tremor Required Braces or Orthoses: Other Brace Other Brace: Pre-fab off the shelf R AFO Restrictions Weight Bearing Restrictions: No      Mobility  Bed Mobility Overal bed mobility: Needs Assistance Bed Mobility: Sit to Supine           General bed mobility comments: Received sitting on edge of bed. Needed min A to bring LEs up onto high stretcher    Transfers Overall transfer level: Modified independent Equipment used: Rolling walker (2 wheels) Transfers: Sit to/from Stand Sit to Stand: Modified independent (Device/Increase time)                Ambulation/Gait Ambulation/Gait assistance: Supervision Gait Distance (Feet): 150 Feet Assistive device: Rolling walker (2 wheels) Gait Pattern/deviations: Step-through pattern, Decreased step length - right, Decreased step length - left, Shuffle Gait velocity: WFL     General Gait Details: occasional shuffle gait, decreased ability to start from stopped position due to [employer] Mobility    Modified Rankin (Stroke Patients Only)       Balance Overall balance assessment: Modified Independent Sitting-balance support: Feet supported Sitting balance-Leahy Scale: Normal     Standing balance support: Bilateral upper extremity supported, During functional activity, Reliant on assistive device for balance Standing balance-Leahy Scale: Good Standing balance comment: Pt needs UE support during mobility.  Pertinent Vitals/Pain Pain Assessment Pain Assessment: 0-10 Pain Score: 4  Faces Pain Scale: Hurts a little bit Pain Location: R leg and arm Pain Descriptors / Indicators: Discomfort Pain Intervention(s): Monitored during session    Home Living Family/patient expects to be  discharged to:: Private residence Living Arrangements: Spouse/significant other Available Help at Discharge: Available PRN/intermittently Type of Home: House Home Access: Stairs to enter;Ramped entrance   Entrance Stairs-Number of Steps: 2   Home Layout: One level Home Equipment: Shower seat;Hand held shower head;Electric scooter;Grab bars - tub/shower;Rollator (4 wheels);Cane - quad Additional Comments: uses rollator at home    Prior Function Prior Level of Function : Independent/Modified Independent             Mobility Comments: modified independent with mobility ADLs Comments: Spouse would assist with some LB dressing     Hand Dominance   Dominant Hand: Right    Extremity/Trunk Assessment   Upper Extremity Assessment Upper Extremity Assessment: Overall WFL for tasks assessed RUE Deficits / Details: tremor more significant in the RUE compared to left but strength 4/5 throughout with gross testing RUE Coordination: decreased fine motor;decreased gross motor LUE Deficits / Details: Shoulder flexion 2-/10, elbow flexion/extension 3+/4, grip 3+/4. History of rotator cuff surgery and limited shoulder movement LUE Coordination: decreased fine motor;decreased gross motor    Lower Extremity Assessment Lower Extremity Assessment: Overall WFL for tasks assessed    Cervical / Trunk Assessment Cervical / Trunk Assessment: Kyphotic  Communication   Communication: No difficulties  Cognition Arousal/Alertness: Awake/alert Behavior During Therapy: WFL for tasks assessed/performed Overall Cognitive Status: Within Functional Limits for tasks assessed                                          General Comments      Exercises     Assessment/Plan    PT Assessment Patient needs continued PT services  PT Problem List Decreased mobility;Decreased strength;Pain       PT Treatment Interventions DME instruction;Gait training;Therapeutic exercise;Balance  training;Stair training;Functional mobility training;Therapeutic activities;Patient/family education    PT Goals (Current goals can be found in the Care Plan section)  Acute Rehab PT Goals Patient Stated Goal: to return home PT Goal Formulation: With patient/family Time For Goal Achievement: 12/27/22 Potential to Achieve Goals: Good    Frequency Min 3X/week     Co-evaluation               AM-PAC PT "6 Clicks" Mobility  Outcome Measure Help needed turning from your back to your side while in a flat bed without using bedrails?: A Little Help needed moving from lying on your back to sitting on the side of a flat bed without using bedrails?: A Little Help needed moving to and from a bed to a chair (including a wheelchair)?: A Little Help needed standing up from a chair using your arms (e.g., wheelchair or bedside chair)?: A Little Help needed to walk in hospital room?: A Little Help needed climbing 3-5 steps with a railing? : A Little 6 Click Score: 18    End of Session Equipment Utilized During Treatment: Gait belt Activity Tolerance: Patient tolerated treatment well Patient left: in bed;with call bell/phone within reach;with family/visitor present Nurse Communication: Mobility status PT Visit Diagnosis: Unsteadiness on feet (R26.81);Other abnormalities of gait and mobility (R26.89);Difficulty in walking, not elsewhere classified (R26.2)    Time: 0981-1914 PT Time Calculation (  min) (ACUTE ONLY): 23 min   Charges:   PT Evaluation $PT Eval Moderate Complexity: 1 Mod PT Treatments $Gait Training: 8-22 mins        Ayat Drenning, PT, GCS 12/22/22,2:08 PM

## 2022-12-22 NOTE — Progress Notes (Addendum)
STROKE TEAM PROGRESS NOTE   INTERVAL HISTORY Wife at bedside.   Presented to ED 5/19 with left-sided paresthesia and vague facial weakness accompanied with left arm and chest pain.  This started abruptly while in church.  Patient states he then took nitroglycerin for the chest pain.  Then the weakness in his left side got worse.  Patient does have a history of A-fib atrial fibrillation on Eliquis at home.  Therefore he was not a TNK candidate.  Patient unable to get MRI imaging due to history of aneurysm clip. CT head x 2 negative.  Stroke workup: A1c in prediabetic range, recommend close follow-up with PCP.  LDL within goal range. EF 55 to 60%, bubble study unable to be done due to no IV access.  On exam today tremor seen to bilateral upper extremity right greater than left.  Patient states the right tremor is his baseline but that it has been getting worse and has spread over to the left arm over the past few months  Drift seen to bilateral upper extremities with left greater than right.  Continued left numbness.  Oriented, able to recall events, naming repetition intact.   Recommend patient continues Eliquis on discharge.  Follow-up with PCP, cardiology, neurology.  Vitals:   12/22/22 0700 12/22/22 0800 12/22/22 1004 12/22/22 1304  BP: 110/82 (!) 135/97 123/70 127/88  Pulse: 84  84 84  Resp: (!) 8 16 (!) 103 (!) 101  Temp:   97.6 F (36.4 C) 97.6 F (36.4 C)  TempSrc:   Oral Oral  SpO2: 97%  97% 99%  Weight:      Height:       CBC:  Recent Labs  Lab 12/21/22 1218 12/21/22 1229 12/22/22 0300  WBC 10.5  --  10.9*  NEUTROABS 8.2*  --   --   HGB 10.8* 10.9* 10.9*  HCT 35.9* 32.0* 35.6*  MCV 99.4  --  94.4  PLT 251  --  243   Basic Metabolic Panel:  Recent Labs  Lab 12/21/22 1218 12/21/22 1229 12/21/22 1929 12/22/22 0300  NA 139 141  --  136  K 4.3 4.3  --  4.3  CL 105 106  --  105  CO2 19*  --   --  21*  GLUCOSE 99 98  --  135*  BUN 26* 28*  --  23  CREATININE  1.34* 1.30*  --  1.26*  CALCIUM 8.9  --   --  8.2*  MG  --   --  1.2*  --    Lipid Panel:  Recent Labs  Lab 12/21/22 1307  CHOL 65  TRIG 82  HDL 32*  CHOLHDL 2.0  VLDL 16  LDLCALC 17   HgbA1c:  Recent Labs  Lab 12/22/22 0300  HGBA1C 5.7*   Urine Drug Screen: No results for input(s): "LABOPIA", "COCAINSCRNUR", "LABBENZ", "AMPHETMU", "THCU", "LABBARB" in the last 168 hours.  Alcohol Level  Recent Labs  Lab 12/21/22 1218  ETH <10    IMAGING past 24 hours ECHOCARDIOGRAM COMPLETE  Result Date: 12/22/2022    ECHOCARDIOGRAM REPORT   Patient Name:   Micheal [name]. Date of Exam: 12/22/2022 Medical Rec #:  [id number]            Height:       66.0 in Accession #:    [id number]           Weight:       157.4 lb Date of Birth:  Apr 08, 1941  BSA:          1.806 m Patient Age:    82 years             BP:           110/82 mmHg Patient Gender: M                    HR:           102 bpm. Exam Location:  Inpatient Procedure: 2D Echo, Cardiac Doppler and Color Doppler Indications:    Stroke  History:        Patient has prior history of Echocardiogram examinations, most                 recent 05/20/2021. CAD, Stroke; Risk Factors:Diabetes,                 Dyslipidemia and Hypertension.  Sonographer:    Milbert Coulter Referring Phys: [id number] JULIE HAVILAND  Sonographer Comments: Patient sent from ED without an IV. Bubble study could not be completed. IMPRESSIONS  1. Left ventricular ejection fraction, by estimation, is 55 to 60%. The left ventricle has normal function. The left ventricle has no regional wall motion abnormalities. Left ventricular diastolic function could not be evaluated.  2. Right ventricular systolic function is normal. The right ventricular size is normal.  3. The mitral valve is normal in structure. Mild mitral valve regurgitation. No evidence of mitral stenosis.  4. The aortic valve is grossly normal. There is mild calcification of the aortic valve. Aortic valve regurgitation  is not visualized. Comparison(s): Prior images unable to be directly viewed, comparison made by report only. Conclusion(s)/Recommendation(s): Normal echo. Unable to obtain IV access for bubble study; if IV placed and bubble study still required, please place order for limited echo with bubble. FINDINGS  Left Ventricle: Left ventricular ejection fraction, by estimation, is 55 to 60%. The left ventricle has normal function. The left ventricle has no regional wall motion abnormalities. The left ventricular internal cavity size was normal in size. There is  borderline left ventricular hypertrophy. Left ventricular diastolic function could not be evaluated due to atrial fibrillation. Left ventricular diastolic function could not be evaluated. Right Ventricle: The right ventricular size is normal. Right vetricular wall thickness was not well visualized. Right ventricular systolic function is normal. Left Atrium: Left atrial size was normal in size. Right Atrium: Right atrial size was normal in size. Pericardium: There is no evidence of pericardial effusion. Mitral Valve: The mitral valve is normal in structure. Mild mitral annular calcification. Mild mitral valve regurgitation. No evidence of mitral valve stenosis. Tricuspid Valve: The tricuspid valve is normal in structure. Tricuspid valve regurgitation is trivial. No evidence of tricuspid stenosis. Aortic Valve: The aortic valve is grossly normal. There is mild calcification of the aortic valve. Aortic valve regurgitation is not visualized. Aortic valve mean gradient measures 3.0 mmHg. Aortic valve peak gradient measures 4.9 mmHg. Aortic valve area, by VTI measures 1.83 cm. Pulmonic Valve: The pulmonic valve was not well visualized. Pulmonic valve regurgitation is trivial. No evidence of pulmonic stenosis. Aorta: The aortic root and ascending aorta are structurally normal, with no evidence of dilitation. IAS/Shunts: The atrial septum is grossly normal.  LEFT VENTRICLE  PLAX 2D LVIDd:         3.10 cm LVIDs:         2.80 cm LV PW:         1.10 cm LV IVS:  1.10 cm LVOT diam:     2.00 cm LV SV:         38 LV SV Index:   21 LVOT Area:     3.14 cm  LV Volumes (MOD) LV vol d, MOD A2C: 50.5 ml LV vol d, MOD A4C: 53.9 ml LV vol s, MOD A2C: 21.2 ml LV vol s, MOD A4C: 18.4 ml LV SV MOD A2C:     29.3 ml LV SV MOD A4C:     53.9 ml LV SV MOD BP:      33.8 ml RIGHT VENTRICLE RV Basal diam:  3.20 cm RV Mid diam:    2.60 cm TAPSE (M-mode): 1.7 cm LEFT ATRIUM             Index        RIGHT ATRIUM           Index LA diam:        3.20 cm 1.77 cm/m   RA Area:     14.70 cm LA Vol (A2C):   41.4 ml 22.92 ml/m  RA Volume:   30.00 ml  16.61 ml/m LA Vol (A4C):   45.0 ml 24.91 ml/m LA Biplane Vol: 44.7 ml 24.75 ml/m  AORTIC VALVE AV Area (Vmax):    2.12 cm AV Area (Vmean):   2.09 cm AV Area (VTI):     1.83 cm AV Vmax:           111.00 cm/s AV Vmean:          75.600 cm/s AV VTI:            0.206 m AV Peak Grad:      4.9 mmHg AV Mean Grad:      3.0 mmHg LVOT Vmax:         74.90 cm/s LVOT Vmean:        50.300 cm/s LVOT VTI:          0.120 m LVOT/AV VTI ratio: 0.58  AORTA Ao Root diam: 3.10 cm Ao Asc diam:  2.90 cm  SHUNTS Systemic VTI:  0.12 m Systemic Diam: 2.00 cm Jodelle Red MD Electronically signed by Jodelle Red MD Signature Date/Time: 12/22/2022/1:04:18 PM    Final    CT HEAD WO CONTRAST ( )  Result Date: 12/22/2022 CLINICAL DATA:  82 year old male code stroke presentation yesterday. History of treated right ICA region aneurysm(s). EXAM: CT HEAD WITHOUT CONTRAST TECHNIQUE: Contiguous axial images were obtained from the base of the skull through the vertex without intravenous contrast. RADIATION DOSE REDUCTION: This exam was performed according to the departmental dose-optimization program which includes automated exposure control, adjustment of the mA and/or kV according to patient size and/or use of iterative reconstruction technique. COMPARISON:  CT head and CTA  head and neck yesterday. FINDINGS: Brain: Mild aneurysm clip streak artifact at the base of the brain. Small area of chronic appearing encephalomalacia in the right inferior frontal gyrus. Larger area of chronic right anterior temporal lobe encephalomalacia. No midline shift, ventriculomegaly, mass effect, evidence of mass lesion, intracranial hemorrhage or evidence of cortically based acute infarction. Vascular: Multiple distal right ICA region aneurysm clips are stable in configuration. Calcified atherosclerosis at the skull base. No suspicious intracranial vascular hyperdensity. Skull: Right frontotemporal craniotomy. No acute osseous abnormality identified. Sinuses/Orbits: Stable and generally well aerated paranasal sinuses, tympanic cavities and mastoids. Right sphenoid sinus fluid and mucosal thickening is stable. Other: No acute orbit or scalp soft tissue finding. IMPRESSION: 1. Stable non contrast CT appearance of  the brain. No acute or evolving infarct identified. 2. Sequelae of distal right ICA region aneurysm clipping with chronic regional frontotemporal encephalomalacia. Electronically Signed   By: Odessa Fleming M.D.   On: 12/22/2022 06:54   DG Shoulder Right  Result Date: 12/21/2022 CLINICAL DATA:  Right shoulder pain, stroke symptoms EXAM: RIGHT SHOULDER - 2+ VIEW COMPARISON:  07/24/2008 FINDINGS: Internal rotation, external rotation, transscapular views of the right shoulder are obtained. No fracture, subluxation, or dislocation. There is evidence of prior rotator cuff repair, with likely prior acromioplasty. Narrowing of the acromial humeral interval. Mild osteoarthritis of the acromioclavicular and glenohumeral joints. Soft tissues are unremarkable. Right chest is clear. IMPRESSION: 1. Postsurgical and degenerative changes of the right shoulder. No acute fracture. Electronically Signed   By: Sharlet Salina M.D.   On: 12/21/2022 16:55   DG Knee 1-2 Views Right  Result Date: 12/21/2022 CLINICAL  DATA:  Right knee pain, stroke symptoms EXAM: RIGHT KNEE - 1-2 VIEW COMPARISON:  None Available. FINDINGS: Frontal and lateral views of the right knee are obtained. No fracture, subluxation, or dislocation. Mild patellofemoral compartmental osteoarthritis. No joint effusion. Soft tissues are unremarkable. IMPRESSION: 1. Mild patellofemoral compartmental osteoarthritis. 2. No acute bony abnormality. Electronically Signed   By: Sharlet Salina M.D.   On: 12/21/2022 16:54   DG HIP UNILAT WITH PELVIS 1V RIGHT  Result Date: 12/21/2022 CLINICAL DATA:  Right hip pain, stroke symptoms EXAM: DG HIP (WITH OR WITHOUT PELVIS) 1V RIGHT COMPARISON:  05/30/2019 FINDINGS: Frontal view of the pelvis as well as a frogleg lateral view of the right hip are obtained. There are no acute displaced fractures. Alignment is anatomic. Mild symmetrical bilateral hip osteoarthritis. Sacroiliac joints are normal. Postsurgical changes at the L4-5 level. Excreted contrast within the bladder from preceding CT. IMPRESSION: 1. No acute displaced fracture. 2. Mild bilateral hip osteoarthritis. Electronically Signed   By: Sharlet Salina M.D.   On: 12/21/2022 16:54   DG Chest 1 View  Result Date: 12/21/2022 CLINICAL DATA:  Chest pain, stroke symptoms EXAM: CHEST  1 VIEW COMPARISON:  11/01/2022 FINDINGS: Single frontal view of the chest demonstrates an unremarkable cardiac silhouette. No airspace disease, effusion, or pneumothorax. No acute bony abnormality. IMPRESSION: 1. No acute intrathoracic process. Electronically Signed   By: Sharlet Salina M.D.   On: 12/21/2022 16:51    PHYSICAL EXAM  Appears well-developed and well-nourished.    Neuro: Mental Status: Patient is awake, alert, oriented to person, place, month, year, and situation. Patient is able to give a clear and coherent history. No signs of aphasia or neglect Cranial Nerves: II: Visual Fields are full. Pupils are equal, round, and reactive to light.   III,IV, VI: EOMI  without ptosis or diploplia.  V: Facial sensation is symmetric to temperature VII: Facial movement with mild left facial weakness VIII: hearing is intact to voice X: Uvula elevates symmetrically XI: Shoulder shrug is symmetric. XII: tongue is midline without atrophy or fasciculations.  Motor: He has significant tremor bilaterally (R>L) BUE drift L > R, Antigravity with no drift BLE.  Sensory: Sensation is diminished throughout the left side Cerebellar: No clear ataxia    ASSESSMENT/PLAN Mr. Micheal [name]. is a 82 y.o. male with history of parkinsons disease, afib on eliquis, who presents with left sided paresthesia and facial weakness that started abruptly while in church 5/19.  Patient has had a couple of these types of events over the past few months.  Unable to get MRI due to history  of aneurysm clip.  CT x 2 negative.  TIA versus small ischemic stroke, unable to confirm due to inability to get MRI imaging. Etiology: Drug-induced hypotension Multiple risk factors of atrial fibrillation, age, hypertension, prediabetic, former smoker, CAD.  Code Stroke CT head No acute abnormality. ASPECTS 10.    Repeat CT Head: Stable non contrast CT appearance of the brain.  CTA head & neck:  Right-sided aneurysm clip partially obscures the cavernous right internal carotid artery. No residual or recurrent aneurysm is present.  2D Echo: EF 55 to 60%, mild MVR.  Unable to do bubble study due to no IV access.  LDL 17 HgbA1c 5.7 VTE prophylaxis - eliquis    Diet   Diet regular Room service appropriate? Yes; Fluid consistency: Thin   Eliquis (apixaban) daily prior to admission, now on Eliquis (apixaban) daily.  Continue Eliquis on discharge. Therapy recommendations:  pending Disposition:  pending  Atrial Fibrillation Rate-controlled Diltiazem home med Continue Eliquis Follow-up with OP Cardiologist  Hypertension Home meds: Losartan 50 mg, Lasix 20 mg--on hold Daughter reported  worsening hypotension at home.  Patient was recently taken off one of his antihypertensive medications.  Long-term BP goal normotensive  Hyperlipidemia Home meds: Crestor 20 mg, resumed in hospital LDL 17, goal < 70 Continue statin at discharge  Diabetes type II Uncontrolled Home meds:   Metformin 850 mg HgbA1c 5.7, goal < 7.0 CBGs Recent Labs    12/21/22 1219 12/21/22 1738  GLUCAP 102* 105*    SSI Close follow-up with PCP recommended as A1c is within prediabetic range while on diabetic medications  Other Stroke Risk Factors Advanced Age >/= 88  Former Cigarette smoker Coronary artery disease   Other Active Problems Concerns Diabetic neuropathy  stage III chronic kidney disease Chronic back pain Anxiety  Hospital day # 0  Patient is OK for discharge from neurology standpoint, with recommendations as above. Follow-up with outpatient neurology in 8 weeks.    Pt seen by Neuro NP/APP and later by MD. Note/plan to be edited by MD as needed.    Lynnae January, DNP, AGACNP-BC [facility]  STROKE MD NOTE : I have personally obtained history,examined this patient, reviewed notes, independently viewed imaging studies, participated in medical decision making and plan of care.ROS completed by me personally and pertinent positives fully documented  I have made any additions or clarifications directly to the above note. Agree with note above.  Patient presents with transient left face and arm weakness and numbness which appears to be resolving.  CT scan of the head x 2 is negative for acute stroke with strong suspicion for small subcortical infarct and difficult to image his MRI not possible due to incompatible aneurysm clip.  Recommend continue Eliquis for stroke prevention for A-fib as there is no definitive data suggesting switching to Pradaxa or Xarelto or warfarin is necessarily so.  Or adding aspirin is better.  Maintain aggressive risk factor modification.  Greater  than 50% time during this 50-minute visit was spent in counseling and coordination of care about TIA and stroke and atrial fibrillation and discussion about stroke prevention and answering questions.  Delia Heady, MD Medical Director [facility] Pager: [phone] 12/22/2022 3:30 PM  Please use AMION for contact information & EPIC for messaging.   To contact Stroke Continuity provider, please refer to [email or web address]. After hours, contact General Neurology

## 2022-12-22 NOTE — ED Notes (Signed)
Patient's IV started leaking, removed it and cleaned up patient. Will place new one in the morning.

## 2022-12-22 NOTE — ED Notes (Signed)
Pt returned from vascular. Doesn't have an IV.

## 2022-12-22 NOTE — Discharge Instructions (Addendum)
To Mr. Micheal [name]. or their caretakers,  They were admitted to [facility] on 12/21/2022 for evaluation and treatment of:  Principal Problem:   Drug-induced hypotension Active Problems:   CAD (coronary artery disease)   Cerebral vascular disease   Stage 3b chronic kidney disease (CKD) (HCC)   History of stroke   Type 2 diabetes mellitus with diabetic nephropathy (HCC)   Persistent atrial fibrillation (HCC)  The evaluation suggested drug-induced hypotension due to nitroglycerin that could've caused a "mini-stroke" (TIA) vs a small ischemic stroke. Unfortunately, cannot confirm if there was a stroke with MRI due to the clips in his brain.   They were discharged from the hospital on 12/22/22. I recommend the following after leaving the hospital:  Stop taking nitroglycerin, losartan and viagra due to the increased risk of lowering your blood pressure too much and potentially causing damage to your brain or heart. Follow-up with your cardiologist before restarting these medications.   Follow-up with your PCP in 1-2 weeks after this hospitalization. Follow-up your diabetes closely with PCP (A1c 5.7). Follow-up with your neurologist in 8 weeks. Continue to follow-up with your cardiologist.   Karie Fetch MD, PGY-1  12/22/2022, 1:36 PM

## 2022-12-22 NOTE — Progress Notes (Addendum)
NAME:  Micheal Sacha., MRN:  [id number], DOB:  29-Mar-1941, LOS: 0 ADMISSION DATE:  12/21/2022  Subjective  NAEON/Overnight events: Received percocet x2.   Patient evaluated at bedside this AM. He is feeling better this morning but still having some left sided extremity pain without any paresthesias or numbness/weakness. Pain medications are working well for his pain. Went for echo but needed new IV for contrast.   Has been on nitro since about 09/2022 and only took this once before hospitalization. Has been on HCTZ, losartan, diltiazem.   Objective   Blood pressure (!) 143/130, pulse 93, temperature 97.6 F (36.4 C), temperature source Oral, resp. rate 13, height 5\' 6"  (1.676 m), weight 71.4 kg, SpO2 100 %.    Bps 96/52-143/130   No intake or output data in the 24 hours ending 12/22/22 4696 Filed Weights   12/21/22 1200 12/21/22 1317  Weight: 71.4 kg 71.4 kg   Physical Exam: GEN: chronically ill-appearing man in mild distress with significant tremor in R hand. Wearing glasses.   CARDIO: irregular rhythm. Tachycardic. No m/r/g. Radial pulses intact.  PULM: CTAB. Normal WOB on RA.  ABD: soft, non-tender, non-distended. Normoactive bowel sounds.  NEURO: EOM intact. Facial sensation intact. L facial droop. Strength 4/5 in LUE, 4/5 in RUE. Strength 4/5 in LLE, 4/5 in RLE (limited by pain). Sensation intact bilaterally in LE.  MSK: Full ROM with R shoulder and R knee, reports tenderness.   Labs       Latest Ref Rng & Units 12/22/2022    3:00 AM 12/21/2022   12:29 PM 12/21/2022   12:18 PM  CBC  WBC 4.0 - 10.5 K/uL 10.9   10.5   Hemoglobin 13.0 - 17.0 g/dL 29.5  28.4  13.2   Hematocrit 39.0 - 52.0 % 35.6  32.0  35.9   Platelets 150 - 400 K/uL 243   251       Latest Ref Rng & Units 12/22/2022    3:00 AM 12/21/2022   12:29 PM 12/21/2022   12:18 PM  BMP  Glucose 70 - 99 mg/dL 440  98  99   BUN 8 - 23 mg/dL 23  28  26    Creatinine 0.61 - 1.24 mg/dL 1.02  7.25  3.66   Sodium  135 - 145 mmol/L 136  141  139   Potassium 3.5 - 5.1 mmol/L 4.3  4.3  4.3   Chloride 98 - 111 mmol/L 105  106  105   CO2 22 - 32 mmol/L 21   19   Calcium 8.9 - 10.3 mg/dL 8.2   8.9   Y4I 5.7  LDL 17 TSH 1.2  Mg 1.2   Imaging:  CT HEAD WO CONTRAST ( ) Result Date: 12/22/2022  IMPRESSION: 1. Stable non contrast CT appearance of the brain. No acute or evolving infarct identified. 2. Sequelae of distal right ICA region aneurysm clipping with chronic regional frontotemporal encephalomalacia. Electronically Signed   By: Odessa Fleming M.D.   On: 12/22/2022 06:54   DG Shoulder Right Result Date: 12/21/2022 IMPRESSION: 1. Postsurgical and degenerative changes of the right shoulder. No acute fracture. Electronically Signed   By: Sharlet Salina M.D.   On: 12/21/2022 16:55   DG Knee 1-2 Views Right Result Date: 12/21/2022  IMPRESSION: 1. Mild patellofemoral compartmental osteoarthritis. 2. No acute bony abnormality. Electronically Signed   By: Sharlet Salina M.D.   On: 12/21/2022 16:54   DG HIP UNILAT WITH PELVIS 1V RIGHT Result Date: 12/21/2022  IMPRESSION: 1. No acute displaced fracture. 2. Mild bilateral hip osteoarthritis. Electronically Signed   By: Sharlet Salina M.D.   On: 12/21/2022 16:54   DG Chest 1 View Result Date: 12/21/2022 IMPRESSION: 1. No acute intrathoracic process. Electronically Signed   By: Sharlet Salina M.D.   On: 12/21/2022 16:51   CT ANGIO HEAD NECK W WO CM (CODE STROKE) Result Date: 12/21/2022  IMPRESSION: 1. 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 No residual or recurrent aneurysm is present. 2. No other significant proximal stenosis, aneurysm, or branch vessel occlusion within the Circle of Willis. 3. Atherosclerotic changes at the carotid bifurcations and cavernous internal carotid arteries bilaterally without significant stenosis relative to the more distal vessels. 4. Tortuosity of the cervical internal carotid arteries  bilaterally without significant stenosis. This is nonspecific, but most commonly seen in the setting of hypertension. 5. Multilevel degenerative changes in the cervical spine. 6. 13 mm well-circumscribed hyperdense lesion in the right parotid gland. This most likely represents a benign lesion such as a pleomorphic adenoma or Wharton's tumor. Recommend ENT follow-up. 7. 12 mm hypodense nodule in the right lobe of the thyroid. Not clinically significant; no follow-up imaging recommended (ref: J Am Coll Radiol. 2015 Feb;12(2): 143-50). 8. A 6.5 mm nodule is present in the medial left upper lobe, similar in size to the prior chest CT. Given this is stable from CT scan of 13 months ago, additional CT scan at 6-12 months is considered optional for low-risk patients, but is recommended for high-risk patients. This recommendation follows the consensus statement: Guidelines for Management of Incidental Pulmonary Nodules Detected on CT Images: From the Fleischner Society 2017; Radiology 2017; 284:228-243. 9. Aortic Atherosclerosis (ICD10-I70.0) and Emphysema (ICD10-J43.9). Electronically Signed   By: Marin Roberts M.D.   On: 12/21/2022 12:49   CT HEAD CODE STROKE WO CONTRAST Result Date: 12/21/2022 IMPRESSION: 1. No evidence of acute intracranial abnormality.  ASPECTS is 10. 2. 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 Code stroke imaging results were communicated on 12/21/2022 at 12:33 pm to provider Dr. Amada Jupiter via secure text paging. Electronically Signed   By: Feliberto Harts M.D.   On: 12/21/2022 12:33     Summary  Patient is a 82 yo male with a PMHx of Parkinson's, Afib on eliquis, anxiety, chronic back pain, DM, HLD, HTN, CKD who presented to [facility] after having chest pain, L sided weakness and facial droop.   Assessment & Plan:  Principal Problem:   Acute ischemic stroke (HCC)  #Suspected symptomatic hypotension, drug induced #TIA Suspect he may have had TIA  that caused his L facial droop and weakness after taking nitroglycerin for his chest pain. Repeat CT head stable. Pending echocardiogram. Advised patient to hold nitroglycerin for now and follow-up with cardiology. Will also hold anti-HTN meds on discharge.  -neurology following, appreciate assistance  -permissive HTN x 48 hours  -q2h neuro checks  -continue eliquis  -hold anti-HTN, hold nitroglycerin on discharge  -f/u echo -PT/OT   #CAD #Hypomagnesemia  Patient reported chest pain prior to arrival that improved with nitroglycerin. ACS (NSTEMI vs STEMI) rule out with negative troponin, repeat EKG with only Afib. Suspect due to chronic, stable CAD. Less likely PE given he is on full dose anticoagulation. CXR wnl. TSH wnl. Repleted Mg with IV today.   -start home mg-ox 400 daily  -replete IV Mg 4mg   -continue home lasix  -continue home PRN nitroglycerin    #R knee, hip, shoulder osteoarthritis  Reports new pain in his R knee and shoulder. Imaging consistent with OA of hip, knee, shoulder. Has been getting PRN percocet for pain.  -PRN voltaren gel, tylenol, percocet 5-235 q6h  #DM:  Glucose 135 on arrival. A1c 5.7.  -Will monitor sugars.   #Stage 3b CKD:  Cr stable.  -trend BMP    Chronic conditions:  Parkinsons: continue home sinemet and requip COPD: continue home LABA LAMA ICS  Afib: continue home eliquis and cardizem HTN: holding home anti-HTN (losartan 25) Mixed dyslipidemia: continue home crestor  GERD: continue home prilosec  Anxiety: continue home PRN xanax  BPH: continue home tamsulosin   Best practice:  DIET: Regular  IVF: None  DVT PPX: on eliquis  BOWEL: PRN senna  CODE: FULL FAM COM: wife at bedside   PT/OT recs: pending  Dispo: Likely home  Barriers to discharge: continued medical work-up  Karie Fetch, MD Internal Medicine Resident PGY-1 PAGER: [phone] 12/22/2022 6:32 AM  If after hours (below), please contact on-call pager:  [phone] 5PM-7AM Monday-Friday 1PM-7AM Saturday-Sunday

## 2022-12-22 NOTE — Progress Notes (Signed)
Discharge instructions provided to patient. All medications, follow up appointments, and discharge instructions provided. IV out. Discharging to home.  

## 2022-12-22 NOTE — TOC Transition Note (Signed)
Transition of Care [facility]) - CM/SW Discharge Note   Patient Details  Name: Micheal [name]. MRN: [id number] Date of Birth: Mar 28, 1941  [facility] CM/SW Contact:  Kermit Balo, RN Phone Number: 12/22/2022, 3:22 PM   Clinical Narrative:    Pt is discharging home with his spouse. They are together unless she is at work. Home health recommended and pt is already active with [facility]. CM called and updated them on his admission and resumption orders.  No new DME needs.  Wife over sees his medications.  Pt drives but wife can assist when available.  Wife transporting home today.   Final next level of care: Home w Home Health Services Barriers to Discharge: No Barriers Identified   Patient Goals and CMS Choice CMS Medicare.gov Compare Post Acute Care list provided to:: Patient Choice offered to / list presented to : Patient  Discharge Placement                         Discharge Plan and Services Additional resources added to the After Visit Summary for                            [facility]ranged: PT, OT [facility] Agency: [facility] Date [facility] Agency Contacted: 12/22/22   Representative spoke with at [facility] Agency: Alvino Chapel  Social Determinants of Health (SDOH) Interventions SDOH Screenings   Tobacco Use: Medium Risk (12/21/2022)     Readmission Risk Interventions     No data to display

## 2022-12-22 NOTE — ED Notes (Signed)
Pt went to vascular. 

## 2023-01-06 ENCOUNTER — Other Ambulatory Visit: Payer: Self-pay | Admitting: Cardiology

## 2023-01-07 NOTE — Telephone Encounter (Signed)
RX sent

## 2023-06-20 ENCOUNTER — Emergency Department (HOSPITAL_COMMUNITY): Payer: 59

## 2023-06-20 ENCOUNTER — Observation Stay (HOSPITAL_COMMUNITY): Payer: 59

## 2023-06-20 ENCOUNTER — Other Ambulatory Visit: Payer: Self-pay

## 2023-06-20 ENCOUNTER — Observation Stay (HOSPITAL_COMMUNITY)
Admission: EM | Admit: 2023-06-20 | Discharge: 2023-06-21 | Disposition: A | Discharge status: Home Health | Payer: 59 | Attending: Family Medicine | Admitting: Family Medicine

## 2023-06-20 DIAGNOSIS — E785 Hyperlipidemia, unspecified: Secondary | ICD-10-CM | POA: Diagnosis not present

## 2023-06-20 DIAGNOSIS — Z96651 Presence of right artificial knee joint: Secondary | ICD-10-CM | POA: Insufficient documentation

## 2023-06-20 DIAGNOSIS — Z8673 Personal history of transient ischemic attack (TIA), and cerebral infarction without residual deficits: Secondary | ICD-10-CM | POA: Insufficient documentation

## 2023-06-20 DIAGNOSIS — J449 Chronic obstructive pulmonary disease, unspecified: Secondary | ICD-10-CM | POA: Diagnosis not present

## 2023-06-20 DIAGNOSIS — E1122 Type 2 diabetes mellitus with diabetic chronic kidney disease: Secondary | ICD-10-CM | POA: Insufficient documentation

## 2023-06-20 DIAGNOSIS — I639 Cerebral infarction, unspecified: Secondary | ICD-10-CM

## 2023-06-20 DIAGNOSIS — Z87891 Personal history of nicotine dependence: Secondary | ICD-10-CM | POA: Diagnosis not present

## 2023-06-20 DIAGNOSIS — I4819 Other persistent atrial fibrillation: Secondary | ICD-10-CM | POA: Insufficient documentation

## 2023-06-20 DIAGNOSIS — I10 Essential (primary) hypertension: Secondary | ICD-10-CM | POA: Diagnosis present

## 2023-06-20 DIAGNOSIS — D110 Benign neoplasm of parotid gland: Secondary | ICD-10-CM | POA: Diagnosis not present

## 2023-06-20 DIAGNOSIS — Z7901 Long term (current) use of anticoagulants: Secondary | ICD-10-CM | POA: Diagnosis not present

## 2023-06-20 DIAGNOSIS — G20C Parkinsonism, unspecified: Secondary | ICD-10-CM | POA: Diagnosis not present

## 2023-06-20 DIAGNOSIS — Z8679 Personal history of other diseases of the circulatory system: Secondary | ICD-10-CM | POA: Insufficient documentation

## 2023-06-20 DIAGNOSIS — N1832 Chronic kidney disease, stage 3b: Secondary | ICD-10-CM | POA: Diagnosis present

## 2023-06-20 DIAGNOSIS — I129 Hypertensive chronic kidney disease with stage 1 through stage 4 chronic kidney disease, or unspecified chronic kidney disease: Secondary | ICD-10-CM | POA: Insufficient documentation

## 2023-06-20 DIAGNOSIS — R2990 Unspecified symptoms and signs involving the nervous system: Secondary | ICD-10-CM | POA: Diagnosis present

## 2023-06-20 DIAGNOSIS — E1140 Type 2 diabetes mellitus with diabetic neuropathy, unspecified: Secondary | ICD-10-CM | POA: Diagnosis not present

## 2023-06-20 DIAGNOSIS — E119 Type 2 diabetes mellitus without complications: Secondary | ICD-10-CM

## 2023-06-20 DIAGNOSIS — N179 Acute kidney failure, unspecified: Principal | ICD-10-CM | POA: Diagnosis present

## 2023-06-20 DIAGNOSIS — Z794 Long term (current) use of insulin: Secondary | ICD-10-CM | POA: Insufficient documentation

## 2023-06-20 DIAGNOSIS — R479 Unspecified speech disturbances: Secondary | ICD-10-CM | POA: Diagnosis present

## 2023-06-20 DIAGNOSIS — G20A1 Parkinson's disease without dyskinesia, without mention of fluctuations: Secondary | ICD-10-CM | POA: Diagnosis present

## 2023-06-20 DIAGNOSIS — E782 Mixed hyperlipidemia: Secondary | ICD-10-CM

## 2023-06-20 DIAGNOSIS — R10819 Abdominal tenderness, unspecified site: Secondary | ICD-10-CM | POA: Diagnosis not present

## 2023-06-20 DIAGNOSIS — I2510 Atherosclerotic heart disease of native coronary artery without angina pectoris: Secondary | ICD-10-CM | POA: Insufficient documentation

## 2023-06-20 DIAGNOSIS — G20B2 Parkinson's disease with dyskinesia, with fluctuations: Secondary | ICD-10-CM

## 2023-06-20 HISTORY — DX: Hypomagnesemia: E83.42

## 2023-06-20 HISTORY — DX: Unspecified symptoms and signs involving the nervous system: R29.90

## 2023-06-20 HISTORY — DX: Acute kidney failure, unspecified: N17.9

## 2023-06-20 LAB — AMMONIA: Ammonia: <10 umol/L (ref 9–35)

## 2023-06-20 LAB — DIFFERENTIAL
Abs Immature Granulocytes: 0.03 10*3/uL (ref 0.00–0.07)
Basophils Absolute: 0 10*3/uL (ref 0.0–0.1)
Basophils Relative: 1 %
Eosinophils Absolute: 0.1 10*3/uL (ref 0.0–0.5)
Eosinophils Relative: 1 %
Immature Granulocytes: 0 %
Lymphocytes Relative: 24 %
Lymphs Abs: 2 10*3/uL (ref 0.7–4.0)
Monocytes Absolute: 0.6 10*3/uL (ref 0.1–1.0)
Monocytes Relative: 7 %
Neutro Abs: 5.7 10*3/uL (ref 1.7–7.7)
Neutrophils Relative %: 67 %

## 2023-06-20 LAB — RETICULOCYTES
Immature Retic Fract: 9.7 % (ref 2.3–15.9)
RBC.: 4.24 MIL/uL (ref 4.22–5.81)
Retic Count, Absolute: 20.4 10*3/uL (ref 19.0–186.0)
Retic Ct Pct: 0.5 % (ref 0.4–3.1)

## 2023-06-20 LAB — IRON AND TIBC
Iron: 20 ug/dL — ABNORMAL LOW (ref 45–182)
Saturation Ratios: 5 % — ABNORMAL LOW (ref 17.9–39.5)
TIBC: 434 ug/dL (ref 250–450)
UIBC: 414 ug/dL

## 2023-06-20 LAB — CBC
HCT: 33.1 % — ABNORMAL LOW (ref 39.0–52.0)
Hemoglobin: 9.9 g/dL — ABNORMAL LOW (ref 13.0–17.0)
MCH: 22.3 pg — ABNORMAL LOW (ref 26.0–34.0)
MCHC: 29.9 g/dL — ABNORMAL LOW (ref 30.0–36.0)
MCV: 74.7 fL — ABNORMAL LOW (ref 80.0–100.0)
Platelets: 270 10*3/uL (ref 150–400)
RBC: 4.43 MIL/uL (ref 4.22–5.81)
RDW: 16.6 % — ABNORMAL HIGH (ref 11.5–15.5)
WBC: 8.4 10*3/uL (ref 4.0–10.5)
nRBC: 0 % (ref 0.0–0.2)

## 2023-06-20 LAB — I-STAT CHEM 8, ED
BUN: 38 mg/dL — ABNORMAL HIGH (ref 8–23)
Calcium, Ion: 1.13 mmol/L — ABNORMAL LOW (ref 1.15–1.40)
Chloride: 103 mmol/L (ref 98–111)
Creatinine, Ser: 1.9 mg/dL — ABNORMAL HIGH (ref 0.61–1.24)
Glucose, Bld: 116 mg/dL — ABNORMAL HIGH (ref 70–99)
HCT: 33 % — ABNORMAL LOW (ref 39.0–52.0)
Hemoglobin: 11.2 g/dL — ABNORMAL LOW (ref 13.0–17.0)
Potassium: 4.2 mmol/L (ref 3.5–5.1)
Sodium: 143 mmol/L (ref 135–145)
TCO2: 23 mmol/L (ref 22–32)

## 2023-06-20 LAB — CBG MONITORING, ED
Glucose-Capillary: 111 mg/dL — ABNORMAL HIGH (ref 70–99)
Glucose-Capillary: 113 mg/dL — ABNORMAL HIGH (ref 70–99)

## 2023-06-20 LAB — URINALYSIS, ROUTINE W REFLEX MICROSCOPIC
Bilirubin Urine: NEGATIVE
Glucose, UA: NEGATIVE mg/dL
Hgb urine dipstick: NEGATIVE
Ketones, ur: NEGATIVE mg/dL
Leukocytes,Ua: NEGATIVE
Nitrite: NEGATIVE
Protein, ur: NEGATIVE mg/dL
Specific Gravity, Urine: 1.01 (ref 1.005–1.030)
pH: 5 (ref 5.0–8.0)

## 2023-06-20 LAB — OSMOLALITY, URINE: Osmolality, Ur: 318 mosm/kg (ref 300–900)

## 2023-06-20 LAB — PHOSPHORUS: Phosphorus: 4.4 mg/dL (ref 2.5–4.6)

## 2023-06-20 LAB — PROTIME-INR
INR: 1.4 — ABNORMAL HIGH (ref 0.8–1.2)
Prothrombin Time: 17 s — ABNORMAL HIGH (ref 11.4–15.2)

## 2023-06-20 LAB — COMPREHENSIVE METABOLIC PANEL
ALT: <5 U/L (ref 0–44)
AST: 16 U/L (ref 15–41)
Albumin: 4.1 g/dL (ref 3.5–5.0)
Alkaline Phosphatase: 82 U/L (ref 38–126)
Anion gap: 15 (ref 5–15)
BUN: 41 mg/dL — ABNORMAL HIGH (ref 8–23)
CO2: 22 mmol/L (ref 22–32)
Calcium: 9.3 mg/dL (ref 8.9–10.3)
Chloride: 105 mmol/L (ref 98–111)
Creatinine, Ser: 1.81 mg/dL — ABNORMAL HIGH (ref 0.61–1.24)
GFR, Estimated: 37 mL/min — ABNORMAL LOW (ref >60)
Glucose, Bld: 121 mg/dL — ABNORMAL HIGH (ref 70–99)
Potassium: 4.5 mmol/L (ref 3.5–5.1)
Sodium: 142 mmol/L (ref 135–145)
Total Bilirubin: 0.5 mg/dL (ref <1.2)
Total Protein: 6.9 g/dL (ref 6.5–8.1)

## 2023-06-20 LAB — LIPASE, BLOOD: Lipase: 34 U/L (ref 11–51)

## 2023-06-20 LAB — HEMOGLOBIN A1C
Hgb A1c MFr Bld: 6.3 % — ABNORMAL HIGH (ref 4.8–5.6)
Mean Plasma Glucose: 134.11 mg/dL

## 2023-06-20 LAB — CREATININE, URINE, RANDOM: Creatinine, Urine: 28 mg/dL

## 2023-06-20 LAB — FERRITIN: Ferritin: 6 ng/mL — ABNORMAL LOW (ref 24–336)

## 2023-06-20 LAB — CK: Total CK: 40 U/L — ABNORMAL LOW (ref 49–397)

## 2023-06-20 LAB — TSH: TSH: 3.341 u[IU]/mL (ref 0.350–4.500)

## 2023-06-20 LAB — ETHANOL: Alcohol, Ethyl (B): <10 mg/dL (ref <10)

## 2023-06-20 LAB — MAGNESIUM: Magnesium: 1.6 mg/dL — ABNORMAL LOW (ref 1.7–2.4)

## 2023-06-20 LAB — APTT: aPTT: 37 s — ABNORMAL HIGH (ref 24–36)

## 2023-06-20 LAB — SODIUM, URINE, RANDOM: Sodium, Ur: 91 mmol/L

## 2023-06-20 MED ORDER — MORPHINE SULFATE (PF) 2 MG/ML IV SOLN
2.0000 mg | Freq: Once | INTRAVENOUS | Status: AC
  Administered 2023-06-20: 2 mg via INTRAVENOUS
  Filled 2023-06-20: qty 1

## 2023-06-20 MED ORDER — DILTIAZEM HCL ER COATED BEADS 120 MG PO CP24
120.0000 mg | ORAL_CAPSULE | Freq: Every day | ORAL | Status: DC
  Administered 2023-06-21: 120 mg via ORAL
  Filled 2023-06-20: qty 1

## 2023-06-20 MED ORDER — BUSPIRONE HCL 10 MG PO TABS
5.0000 mg | ORAL_TABLET | Freq: Two times a day (BID) | ORAL | Status: DC
  Administered 2023-06-21: 5 mg via ORAL
  Filled 2023-06-20: qty 1

## 2023-06-20 MED ORDER — MAGNESIUM SULFATE 2 GM/50ML IV SOLN
2.0000 g | Freq: Once | INTRAVENOUS | Status: AC
  Administered 2023-06-20: 2 g via INTRAVENOUS
  Filled 2023-06-20: qty 50

## 2023-06-20 MED ORDER — UMECLIDINIUM BROMIDE 62.5 MCG/ACT IN AEPB
1.0000 | INHALATION_SPRAY | Freq: Every day | RESPIRATORY_TRACT | Status: DC
  Administered 2023-06-21: 1 via RESPIRATORY_TRACT
  Filled 2023-06-20: qty 7

## 2023-06-20 MED ORDER — ALBUTEROL SULFATE (2.5 MG/3ML) 0.083% IN NEBU: 2.5000 mg | INHALATION_SOLUTION | RESPIRATORY_TRACT | Status: DC | PRN

## 2023-06-20 MED ORDER — ROSUVASTATIN CALCIUM 20 MG PO TABS: 20.0000 mg | ORAL_TABLET | Freq: Every day | ORAL | Status: DC

## 2023-06-20 MED ORDER — FLUTICASONE FUROATE-VILANTEROL 100-25 MCG/ACT IN AEPB
1.0000 | INHALATION_SPRAY | Freq: Every day | RESPIRATORY_TRACT | Status: DC
  Administered 2023-06-21: 1 via RESPIRATORY_TRACT
  Filled 2023-06-20: qty 28

## 2023-06-20 MED ORDER — APIXABAN 5 MG PO TABS
5.0000 mg | ORAL_TABLET | Freq: Two times a day (BID) | ORAL | Status: DC
  Administered 2023-06-21: 5 mg via ORAL
  Filled 2023-06-20: qty 1

## 2023-06-20 MED ORDER — CARBIDOPA-LEVODOPA 25-100 MG PO TABS
3.0000 | ORAL_TABLET | Freq: Three times a day (TID) | ORAL | Status: DC
  Administered 2023-06-21 (×2): 3 via ORAL
  Filled 2023-06-20 (×2): qty 3

## 2023-06-20 MED ORDER — ACETAMINOPHEN 325 MG PO TABS
650.0000 mg | ORAL_TABLET | Freq: Four times a day (QID) | ORAL | Status: DC | PRN
  Administered 2023-06-21: 650 mg via ORAL
  Filled 2023-06-20: qty 2

## 2023-06-20 MED ORDER — GABAPENTIN 100 MG PO CAPS: 100.0000 mg | ORAL_CAPSULE | Freq: Every day | ORAL | Status: DC

## 2023-06-20 MED ORDER — LACTATED RINGERS IV BOLUS
1000.0000 mL | Freq: Once | INTRAVENOUS | Status: AC
  Administered 2023-06-20: 1000 mL via INTRAVENOUS

## 2023-06-20 MED ORDER — PANTOPRAZOLE SODIUM 40 MG PO TBEC
40.0000 mg | DELAYED_RELEASE_TABLET | Freq: Every day | ORAL | Status: DC
  Administered 2023-06-21: 40 mg via ORAL
  Filled 2023-06-20: qty 1

## 2023-06-20 MED ORDER — ONDANSETRON HCL 4 MG/2ML IJ SOLN
4.0000 mg | Freq: Four times a day (QID) | INTRAMUSCULAR | Status: DC | PRN
  Administered 2023-06-21: 4 mg via INTRAVENOUS
  Filled 2023-06-20: qty 2

## 2023-06-20 MED ORDER — ONDANSETRON HCL 4 MG PO TABS: 4.0000 mg | ORAL_TABLET | Freq: Four times a day (QID) | ORAL | Status: DC | PRN

## 2023-06-20 MED ORDER — ROPINIROLE HCL 1 MG PO TABS
0.5000 mg | ORAL_TABLET | Freq: Three times a day (TID) | ORAL | Status: DC
  Administered 2023-06-21: 0.5 mg via ORAL
  Filled 2023-06-20: qty 1

## 2023-06-20 MED ORDER — INSULIN ASPART 100 UNIT/ML IJ SOLN: 0.0000 [IU] | INTRAMUSCULAR | Status: DC

## 2023-06-20 MED ORDER — MORPHINE SULFATE (PF) 2 MG/ML IV SOLN
2.0000 mg | INTRAVENOUS | Status: DC | PRN
  Administered 2023-06-20 – 2023-06-21 (×2): 2 mg via INTRAVENOUS
  Filled 2023-06-20 (×2): qty 1

## 2023-06-20 MED ORDER — TAMSULOSIN HCL 0.4 MG PO CAPS
0.4000 mg | ORAL_CAPSULE | Freq: Every day | ORAL | Status: DC
  Administered 2023-06-21: 0.4 mg via ORAL
  Filled 2023-06-20: qty 1

## 2023-06-20 MED ORDER — ACETAMINOPHEN 650 MG RE SUPP: 650.0000 mg | Freq: Four times a day (QID) | RECTAL | Status: DC | PRN

## 2023-06-20 MED ORDER — SODIUM CHLORIDE 0.9 % IV SOLN: INTRAVENOUS | Status: AC

## 2023-06-20 MED ORDER — SODIUM CHLORIDE 0.9% FLUSH
3.0000 mL | Freq: Once | INTRAVENOUS | Status: AC
  Administered 2023-06-20: 3 mL via INTRAVENOUS

## 2023-06-20 MED ORDER — HYDROCODONE-ACETAMINOPHEN 5-325 MG PO TABS
1.0000 | ORAL_TABLET | ORAL | Status: DC | PRN
  Administered 2023-06-21 (×2): 1 via ORAL
  Filled 2023-06-20 (×2): qty 1

## 2023-06-20 NOTE — Assessment & Plan Note (Signed)
Conitue Crestor 20 mg po  q day

## 2023-06-20 NOTE — ED Provider Notes (Signed)
[place] EMERGENCY DEPARTMENT AT [facility] Provider Note   CSN: [id number] Arrival date & time: 06/20/23  1426  An emergency department physician performed an initial assessment on this suspected stroke patient at 10.  History  Chief Complaint  Patient presents with   Code Stroke    Micheal [name]. is a 82 y.o. male.  HPI 82 year old male history of atrial fibrillation, prior stroke, CAD, diabetes, hypertension, hyperlipidemia, Parkinson's disease presenting for speech difficulty.  Patient is accompanied by his wife.  Wife reports patient was at his baseline around 7 AM today.  Around 1 PM she found him with difficulty speaking compared to baseline and felt like his face was drooping.  EMS was called.  Patient was activated on arrival.  Patient reports he is having some difficulty speaking.  He denies significant headache.  No nausea or vomiting.  Denies any chest pain or shortness of breath.  He does have some abdominal pain which started about an hour ago.  Also reports acute on chronic pain to his right hip, denies any falls presents with pain to his right knee as well unsure how long this has been there.  No fevers or chills.  Baseline can stand but has difficulty walking.  He has not missed any medications including his midday Parkinson's medicines.  Wife feels like his tremor is worse than normal.     Home Medications Prior to Admission medications   Medication Sig Start Date End Date Taking? Authorizing Provider  acetaminophen (TYLENOL) 500 MG tablet Take 1,000 mg by mouth daily.   Yes [provider]  albuterol (VENTOLIN HFA) 108 (90 Base) MCG/ACT inhaler Inhale 2 puffs into the lungs every 8 (eight) hours as needed for shortness of breath. 12/20/21  Yes [provider]  ALPRAZolam (XANAX) 0.25 MG tablet Take 0.25 mg by mouth every 6 (six) hours as needed for anxiety.   Yes [provider]  busPIRone (BUSPAR) 5 MG tablet Take 5 mg by  mouth 2 (two) times daily.   Yes [provider]  carbidopa-levodopa (SINEMET IR) 25-100 MG tablet Take 3 tablets by mouth 3 (three) times daily. 0830, 1300, 2200 10/31/15  Yes [provider]  diclofenac Sodium (VOLTAREN ARTHRITIS PAIN) 1 % GEL Apply 2 g topically 2 (two) times daily as needed (pain).   Yes [provider]  diltiazem (CARTIA XT) 120 MG 24 hr capsule Take 1 capsule by mouth once daily 01/07/23  Yes Revankar, Aundra Dubin, MD  ELIQUIS 5 MG TABS tablet Take 5 mg by mouth 2 (two) times daily. 08/28/22  Yes [provider]  fluticasone (FLONASE) 50 MCG/ACT nasal spray Place 2 sprays into both nostrils daily.   Yes [provider]  furosemide (LASIX) 20 MG tablet Take 20 mg by mouth daily.   Yes [provider]  gabapentin (NEURONTIN) 300 MG capsule Take 300 mg by mouth 3 (three) times daily as needed (pain).   Yes [provider]  magnesium oxide (MAG-OX) 400 (240 Mg) MG tablet Take 400 mg by mouth daily.   Yes [provider]  metFORMIN (GLUCOPHAGE) 850 MG tablet Take 850 mg by mouth with breakfast, with lunch, and with evening meal.   Yes [provider]  Multiple Vitamin (MULTIVITAMIN) tablet Take 1 tablet by mouth daily.   Yes [provider]  omega-3 acid ethyl esters (LOVAZA) 1 g capsule Take 1 capsule by mouth daily.   Yes [provider]  omeprazole (PRILOSEC) 40 MG  capsule Take 40 mg by mouth daily.  10/21/15  Yes [provider]  polycarbophil (FIBERCON) 625 MG tablet Take 625 mg by mouth daily.   Yes [provider]  rOPINIRole (REQUIP) 0.5 MG tablet Take 0.5 mg by mouth 3 (three) times daily.   Yes [provider]  rosuvastatin (CRESTOR) 20 MG tablet Take 20 mg by mouth at bedtime.   Yes [provider]  tamsulosin (FLOMAX) 0.4 MG CAPS capsule Take 0.4 mg by mouth daily after breakfast.    Yes [provider]  testosterone cypionate  (DEPOTESTOSTERONE CYPIONATE) 200 MG/ML injection Inject 200 mg into the muscle every 14 (fourteen) days. 04/09/23  Yes [provider]  TRELEGY ELLIPTA 100-62.5-25 MCG/ACT AEPB Take 1 puff by mouth daily. 11/29/21  Yes [provider]      Allergies    Patient has no known allergies.    Review of Systems   Review of Systems Review of systems completed and notable as per HPI.  ROS otherwise negative.   Physical Exam Updated Vital Signs BP (!) 144/94 (BP Location: Left Arm)   Pulse 90   Temp 98.4 F (36.9 C) (Oral)   Resp 16   Wt 66.4 kg   SpO2 94%   BMI 23.63 kg/m  Physical Exam Vitals and nursing note reviewed.  Constitutional:      General: He is not in acute distress.    Appearance: He is well-developed.  HENT:     Head: Normocephalic and atraumatic.     Nose: Nose normal.     Mouth/Throat:     Mouth: Mucous membranes are moist.     Pharynx: Oropharynx is clear.  Eyes:     Extraocular Movements: Extraocular movements intact.     Conjunctiva/sclera: Conjunctivae normal.     Pupils: Pupils are equal, round, and reactive to light.  Cardiovascular:     Rate and Rhythm: Normal rate and regular rhythm.     Pulses: Normal pulses.     Heart sounds: Normal heart sounds. No murmur heard. Pulmonary:     Effort: Pulmonary effort is normal. No respiratory distress.     Breath sounds: Normal breath sounds.  Abdominal:     Palpations: Abdomen is soft.     Tenderness: There is abdominal tenderness. There is no guarding or rebound.  Musculoskeletal:        General: No swelling.     Cervical back: Normal range of motion and neck supple. No rigidity or tenderness.     Right lower leg: No edema.     Left lower leg: No edema.     Comments: Patient has pain with range of motion of the right hip.  No skin changes.  No tenderness or swelling to the right knee  Skin:    General: Skin is warm and dry.     Capillary Refill: Capillary refill takes less than 2 seconds.   Neurological:     Mental Status: He is alert.     Comments: Slight left-sided facial asymmetry.  No other cranial nerve deficits.  No visual field cuts.  He is able to move all extremities antigravity although has significant tremor worse in the right upper extremity.  Psychiatric:        Mood and Affect: Mood normal.     ED Results / Procedures / Treatments   Labs (all labs ordered are listed, but only abnormal results are displayed) Labs Reviewed  PROTIME-INR - Abnormal; Notable for the following components:  Result Value   Prothrombin Time 17.0 (*)    INR 1.4 (*)    All other components within normal limits  APTT - Abnormal; Notable for the following components:   aPTT 37 (*)    All other components within normal limits  CBC - Abnormal; Notable for the following components:   Hemoglobin 9.9 (*)    HCT 33.1 (*)    MCV 74.7 (*)    MCH 22.3 (*)    MCHC 29.9 (*)    RDW 16.6 (*)    All other components within normal limits  COMPREHENSIVE METABOLIC PANEL - Abnormal; Notable for the following components:   Glucose, Bld 121 (*)    BUN 41 (*)    Creatinine, Ser 1.81 (*)    GFR, Estimated 37 (*)    All other components within normal limits  MAGNESIUM - Abnormal; Notable for the following components:   Magnesium 1.6 (*)    All other components within normal limits  CK - Abnormal; Notable for the following components:   Total CK 40 (*)    All other components within normal limits  HEMOGLOBIN A1C - Abnormal; Notable for the following components:   Hgb A1c MFr Bld 6.3 (*)    All other components within normal limits  IRON AND TIBC - Abnormal; Notable for the following components:   Iron 20 (*)    Saturation Ratios 5 (*)    All other components within normal limits  FERRITIN - Abnormal; Notable for the following components:   Ferritin 6 (*)    All other components within normal limits  OSMOLALITY - Abnormal; Notable for the following components:   Osmolality 302 (*)     All other components within normal limits  I-STAT CHEM 8, ED - Abnormal; Notable for the following components:   BUN 38 (*)    Creatinine, Ser 1.90 (*)    Glucose, Bld 116 (*)    Calcium, Ion 1.13 (*)    Hemoglobin 11.2 (*)    HCT 33.0 (*)    All other components within normal limits  CBG MONITORING, ED - Abnormal; Notable for the following components:   Glucose-Capillary 111 (*)    All other components within normal limits  CBG MONITORING, ED - Abnormal; Notable for the following components:   Glucose-Capillary 113 (*)    All other components within normal limits  DIFFERENTIAL  ETHANOL  LIPASE, BLOOD  URINALYSIS, ROUTINE W REFLEX MICROSCOPIC  RETICULOCYTES  AMMONIA  PHOSPHORUS  CREATININE, URINE, RANDOM  OSMOLALITY, URINE  TSH  SODIUM, URINE, RANDOM  VITAMIN B12  FOLATE  PREALBUMIN  MAGNESIUM  PHOSPHORUS  COMPREHENSIVE METABOLIC PANEL  CBC    EKG EKG Interpretation Date/Time:  Saturday June 20 2023 15:02:14 EST Ventricular Rate:  99 PR Interval:    QRS Duration:  86 QT Interval:  366 QTC Calculation: 436 R Axis:   -47  Text Interpretation: Atrial fibrillation Left anterior fascicular block Confirmed by Fulton Reek [phone]) on 06/20/2023 3:14:27 PM  Radiology DG CHEST PORT 1 VIEW  Result Date: 06/20/2023 CLINICAL DATA:  Stroke EXAM: PORTABLE CHEST 1 VIEW COMPARISON:  12/21/2022 FINDINGS: Single frontal view of the chest demonstrates a stable cardiac silhouette. Bibasilar hypoventilatory changes. No airspace disease, effusion, or pneumothorax. No acute bony abnormalities. IMPRESSION: 1. No acute intrathoracic process. Electronically Signed   By: Sharlet Salina M.D.   On: 06/20/2023 20:13   CT ABDOMEN PELVIS WO CONTRAST  Result Date: 06/20/2023 CLINICAL DATA:  Acute abdominal pain. Nephrolithiasis. Prior  appendectomy. EXAM: CT ABDOMEN AND PELVIS WITHOUT CONTRAST TECHNIQUE: Multidetector CT imaging of the abdomen and pelvis was performed following the  standard protocol without IV contrast. RADIATION DOSE REDUCTION: This exam was performed according to the departmental dose-optimization program which includes automated exposure control, adjustment of the mA and/or kV according to patient size and/or use of iterative reconstruction technique. COMPARISON:  11/01/2022 FINDINGS: Lower chest: No acute findings. Hepatobiliary: No mass visualized on this unenhanced exam. Gallbladder is unremarkable. No evidence of biliary ductal dilatation. Pancreas: No mass or inflammatory process visualized on this unenhanced exam. Spleen:  Within normal limits in size. Adrenals/Urinary tract: Multiple small less than 1 cm renal calculi are seen bilaterally. No evidence of urolithiasis or hydronephrosis. Unremarkable unopacified urinary bladder. Stomach/Bowel: No evidence of obstruction, inflammatory process, or abnormal fluid collections. Diverticulosis is seen mainly involving the sigmoid colon, however there is no evidence of diverticulitis. Vascular/Lymphatic: No pathologically enlarged lymph nodes identified. No evidence of abdominal aortic aneurysm. Reproductive:  No mass or other significant abnormality. Other:  None. Musculoskeletal:  No suspicious bone lesions identified. IMPRESSION: Bilateral nephrolithiasis. No evidence of urolithiasis, hydronephrosis, or other acute findings. Colonic diverticulosis, without radiographic evidence of diverticulitis. Electronically Signed   By: Danae Orleans M.D.   On: 06/20/2023 17:17   DG Hip Unilat With Pelvis 2-3 Views Right  Result Date: 06/20/2023 CLINICAL DATA:  Right hip pain. EXAM: DG HIP (WITH OR WITHOUT PELVIS) 2-3V RIGHT COMPARISON:  None Available. FINDINGS: There is no evidence of hip fracture or dislocation. There is no evidence of arthropathy or other focal bone abnormality. IMPRESSION: Negative. Electronically Signed   By: Ted Mcalpine M.D.   On: 06/20/2023 16:47   DG Knee Complete 4 Views Right  Result Date:  06/20/2023 CLINICAL DATA:  Right hip and right knee pain. EXAM: RIGHT KNEE - COMPLETE 4+ VIEW COMPARISON:  None Available. FINDINGS: No evidence of fracture, dislocation, or joint effusion. No evidence of other focal bone abnormality. Mild osteoarthritic changes of the right knee. Soft tissues are unremarkable. IMPRESSION: Negative. Electronically Signed   By: Ted Mcalpine M.D.   On: 06/20/2023 16:41   CT HEAD CODE STROKE WO CONTRAST  Result Date: 06/20/2023 CLINICAL DATA:  Code stroke. Neuro deficit, acute, stroke suspected. EXAM: CT HEAD WITHOUT CONTRAST TECHNIQUE: Contiguous axial images were obtained from the base of the skull through the vertex without intravenous contrast. RADIATION DOSE REDUCTION: This exam was performed according to the departmental dose-optimization program which includes automated exposure control, adjustment of the mA and/or kV according to patient size and/or use of iterative reconstruction technique. COMPARISON:  CT head without contrast 5//24 FINDINGS: Brain: No acute infarct, hemorrhage, or mass lesion is present. Remote lacunar infarct in the right external capsule is stable. Mild encephalomalacia of the right frontal operculum is stable. Patient is status post right pterional craniotomy for clipping of a right ICA aneurysm. Mild atrophy and white matter changes are present bilaterally. Basal ganglia are otherwise within normal limits. The brainstem and cerebellum are within normal limits. Midline structures are within normal limits. Vascular: Multiple clips are again noted at the level of the ophthalmic segment of the right internal carotid artery. Calcifications are present within the cavernous internal carotid arteries bilaterally. No hyperdense vessel is present. Skull: Right pterional craniotomy noted. Calvarium is otherwise within normal limits. Focal soft tissue swelling/hematoma is present in the high right parietal scalp without underlying fracture. No foreign  body or laceration is evident. Sinuses/Orbits: The paranasal sinuses and mastoid air cells  are clear. The globes and orbits are within normal limits. ASPECTS [facility] Stroke Program Early CT Score) - Ganglionic level infarction (caudate, lentiform nuclei, internal capsule, insula, M1-M3 cortex): 7/7 - Supraganglionic infarction (M4-M6 cortex): 3/3 Total score (0-10 with 10 being normal): 10/10 IMPRESSION: 1. No acute intracranial abnormality or significant interval change. 2. Focal soft tissue swelling/hematoma in the high right parietal scalp without underlying fracture. 3. Stable remote lacunar infarct of the right external capsule. 4. Stable mild encephalomalacia of the right frontal operculum. 5. Status post right pterional craniotomy for clipping of a right ICA aneurysm. 6. Aspects is 10/10. The above was relayed via text pager to Dr. Otelia Limes on 06/20/2023 at 15:02 . Electronically Signed   By: Marin Roberts M.D.   On: 06/20/2023 15:04    Procedures Procedures    Medications Ordered in ED Medications  insulin aspart (novoLOG) injection 0-9 Units (0 Units Subcutaneous Not Given 06/20/23 2054)  busPIRone (BUSPAR) tablet 5 mg (5 mg Oral Not Given 06/20/23 2253)  carbidopa-levodopa (SINEMET IR) 25-100 MG per tablet immediate release 3 tablet (3 tablets Oral Not Given 06/20/23 2253)  diltiazem (CARDIZEM CD) 24 hr capsule 120 mg (has no administration in time range)  apixaban (ELIQUIS) tablet 5 mg (5 mg Oral Not Given 06/20/23 2253)  gabapentin (NEURONTIN) capsule 100 mg (100 mg Oral Not Given 06/20/23 2254)  pantoprazole (PROTONIX) EC tablet 40 mg (has no administration in time range)  rosuvastatin (CRESTOR) tablet 20 mg (20 mg Oral Not Given 06/20/23 2254)  rOPINIRole (REQUIP) tablet 0.5 mg (0.5 mg Oral Not Given 06/20/23 2254)  tamsulosin (FLOMAX) capsule 0.4 mg (has no administration in time range)  fluticasone furoate-vilanterol (BREO ELLIPTA) 100-25 MCG/ACT 1 puff (has no administration  in time range)    And  umeclidinium bromide (INCRUSE ELLIPTA) 62.5 MCG/ACT 1 puff (has no administration in time range)  0.9 %  sodium chloride infusion ( Intravenous New Bag/Given 06/20/23 2304)  acetaminophen (TYLENOL) tablet 650 mg (has no administration in time range)    Or  acetaminophen (TYLENOL) suppository 650 mg (has no administration in time range)  HYDROcodone-acetaminophen (NORCO/VICODIN) 5-325 MG per tablet 1-2 tablet (has no administration in time range)  ondansetron (ZOFRAN) tablet 4 mg (has no administration in time range)    Or  ondansetron (ZOFRAN) injection 4 mg (has no administration in time range)  morphine (PF) 2 MG/ML injection 2 mg (2 mg Intravenous Given 06/20/23 2241)  albuterol (PROVENTIL) (2.5 MG/3ML) 0.083% nebulizer solution 2.5 mg (has no administration in time range)  sodium chloride flush (NS) 0.9 % injection 3 mL (3 mLs Intravenous Given 06/20/23 1500)  magnesium sulfate IVPB 2 g 50 mL (0 g Intravenous Stopped 06/20/23 1800)  morphine (PF) 2 MG/ML injection 2 mg (2 mg Intravenous Given 06/20/23 1647)  lactated ringers bolus 1,000 mL (0 mLs Intravenous Stopped 06/20/23 2001)  morphine (PF) 2 MG/ML injection 2 mg (2 mg Intravenous Given 06/20/23 1841)    ED Course/ Medical Decision Making/ A&P Clinical Course as of 06/21/23 0024  Sat Jun 20, 2023  2005 Discussed with hospitalist for admission. [JD]    Clinical Course User Index [JD] Laurence Spates, MD                                 Medical Decision Making Amount and/or Complexity of Data Reviewed Labs: ordered. Radiology: ordered.  Risk Prescription drug management. Decision regarding hospitalization.   Medical Decision  Making:   Jamoni Larmore. is a 82 y.o. male who presented to the ED today with facial droop and speech difficulty.  Activated as code stroke on arrival.  Last known well was 7 AM and was found at 1 PM with these findings.  Exam does have some difficulty speech although  hard to tell if this is worse than baseline.  Also has some slight facial asymmetry.  A significant tremor from his Parkinson's.  No other focal deficits.  Is already been evaluated by neurology.  Has had some right hip pain which is likely arthritis obtain x-ray to rule out fracture although no trauma.  Has been on exam is notable for mild diffuse tenderness as well, no vomiting or diarrhea.  Will obtain CT scan evaluate.   Patient placed on continuous vitals and telemetry monitoring while in ED which was reviewed periodically.  Reviewed and confirmed nursing documentation for past medical history, family history, social history.  Reassessment and Plan:   Labwork reviewed notable for AKI.  X-ray of the right knee and hip without acute abnormality.  CT of the abdomen pelvis unremarkable as well.  Suspect AKI is likely prerenal.  I talked with Dr. Otelia Limes with neurology who is working on recommendations for the patient.  I think he needs admission regardless for AKI.  Discussed with hospitalist and admitted.   Patient's presentation is most consistent with acute complicated illness / injury requiring diagnostic workup.           Final Clinical Impression(s) / ED Diagnoses Final diagnoses:  AKI (acute kidney injury) [facility])    Rx / DC Orders ED Discharge Orders     None         Laurence Spates, MD 06/21/23 [phone]

## 2023-06-20 NOTE — Code Documentation (Addendum)
Stroke Response Nurse Documentation Code Documentation  Markham Strahl. is a 82 y.o. male arriving to [facility]  for Code Stroke via [facility] with past medical hx of CAD, DM, HLD, HTN, Parkinson's, stroke, afib on Eliquis (apixaban) daily. LKW of 0700, then 1300 his wife found him with a facial droop and difficulty speaking. EMS was called.   Stroke team at the bedside on patient arrival. Labs drawn and patient cleared for CT by EDP.  NIH 9, see flowsheet for details. CT/CTA completed. OOW for TNK. Exam not consistent with LVO. Care Plan: NIH/vitals q2h. Bedside handoff with ED RN Lexi.    Scarlette Slice K  Rapid Response RN

## 2023-06-20 NOTE — ED Provider Notes (Signed)
MSE was initiated and I personally evaluated the patient and placed orders (if any) at  2:38 PM on June 20, 2023.  The patient appears stable so that the remainder of the MSE may be completed by another provider.  82 year old male with history of Parkinson's last known well 7 AM.  He called his wife and she noted that he is having difficulty speaking and some left-sided facial droop.  Code stroke was activated and patient was met on arrival with the neurology team.  He is being taken to CAT scan for further evaluation.   Terrilee Files, MD 06/20/23 1700

## 2023-06-20 NOTE — Assessment & Plan Note (Signed)
Allow permissive HTn  °

## 2023-06-20 NOTE — Assessment & Plan Note (Signed)
-   will replace electrolytes and repeat  check Mg, phos and Ca level and replace as needed Monitor on telemetry   Lab Results  Component Value Date   K 4.2 06/20/2023     Lab Results  Component Value Date   CREATININE 1.90 (H) 06/20/2023   Lab Results  Component Value Date   MG 1.6 (L) 06/20/2023   Lab Results  Component Value Date   CALCIUM 9.3 06/20/2023

## 2023-06-20 NOTE — Assessment & Plan Note (Signed)
Unable to obtain MRI  Symptoms resolved  Seen by Neurology  Not a candidate for TNK Unable to obtain MRI or CTA Can do dopplers tomorrow And repeat ct Contiue crestor  PT OT EVAl  Obtain echo
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
## 2023-06-20 NOTE — ED Triage Notes (Signed)
Pt BIB EMS for code stroke. Pt woke up and wife stated pt was not acting right and aphasic. EMS states pt had right sided facial droop, slurred speech. LSN 0700. Pt is on elliquis for afib and has hx of stroke and parkinsons.

## 2023-06-20 NOTE — ED Notes (Signed)
Spoke with main lab at this time, will add on lipase to existing labs.

## 2023-06-20 NOTE — Subjective & Objective (Signed)
Pt woke up at 1 pm with aphasia, right side facial droop, slurred speech, On eliquis for A.fib Prior CVA LSN 7 AM Pt came in as a code stroke  No Headache, no N/V Endorsed abd pain and right hip pain with no fall No fever no chills

## 2023-06-20 NOTE — Assessment & Plan Note (Signed)
Chornic stable continue sinemet 25-100 3 tabs tid

## 2023-06-20 NOTE — Assessment & Plan Note (Signed)
-  chronic avoid nephrotoxic medications such as NSAIDs, Vanco Zosyn combo,  avoid hypotension, continue to follow renal function  
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
## 2023-06-20 NOTE — ED Notes (Signed)
Family at bedside and Provider at bedside.

## 2023-06-20 NOTE — Assessment & Plan Note (Signed)
Chronic stable continue Crestor 54m gpo q day

## 2023-06-20 NOTE — ED Notes (Signed)
Able to add on Mg with lab at this time.

## 2023-06-20 NOTE — Assessment & Plan Note (Signed)
-   Order Sensitive  SSI     -  check TSH and HgA1C  - Hold by mouth medications*  

## 2023-06-20 NOTE — Assessment & Plan Note (Signed)
Will rehydrate order urine electrolytes  Follow renal function

## 2023-06-20 NOTE — H&P (Signed)
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   Diabetes mellitus without complication (HCC)  - Order Sensitive SSI     -  check TSH and HgA1C  - Hold by mouth medications     Hypomagnesemia - will replace electrolytes and repeat  check Mg, phos and Ca level and replace as needed Monitor on telemetry   Lab Results  Component Value Date   K 4.2 06/20/2023     Lab Results  Component Value  Date  CREATININE 1.90 (H) 06/20/2023   Lab Results  Component Value Date   MG 1.6 (L) 06/20/2023   Lab Results  Component Value Date   CALCIUM 9.3 06/20/2023     AKI (acute kidney injury) (HCC) Will rehydrate order urine electrolytes  Follow renal function  CAD (coronary artery disease) Chronic stable continue Crestor 42m gpo q day  Hyperlipidemia Conitue Crestor 20 mg po  q day  Hypertension Allow permissive HTn  Parkinson's disease (HCC) Chornic stable continue sinemet 25-100 3 tabs tid  Stage 3b chronic kidney disease (CKD) (HCC)  -chronic avoid nephrotoxic medications such as NSAIDs, Vanco Zosyn combo,  avoid hypotension, continue to follow renal function   Stroke-like symptom Unable to obtain MRI  Symptoms resolved  Seen by Neurology  Not a candidate for TNK Unable to obtain MRI or CTA Can do dopplers tomorrow And repeat ct Contiue crestor  PT OT EVAl  Obtain echo   Other plan as per orders.  DVT prophylaxis:  Eliquis   Code Status:    Code Status: Prior FULL CODE  as per patient   I had personally discussed CODE STATUS with patient and family   ACP   none   Family Communication:   Family  at  Bedside  plan of care was discussed  with Wife,     Disposition Plan:         To home once workup is complete and patient is stable   Following barriers for discharge:                             Stroke   work up is complete                            Electrolytes corrected                               Anemia corrected h/H stable                                                        Will need consultants to evaluate patient prior to discharge                        Would benefit from PT/OT eval prior to DC  Ordered                    Consults called: Neurology is aware   Admission status:  ED Disposition     ED Disposition  Admit   Condition  --   Comment  Hospital Area: [facility] [[id number]]  Level of Care: Telemetry Medical  [[id number]]  May place patient in observation at [facility] or [facility] if equivalent level of care is available:: No  Covid Evaluation: Asymptomatic - no recent exposure (last 10 days) testing not required  Diagnosis: AKI (acute kidney injury) [facility]) [[id number]]  Admitting Physician: Therisa Doyne [[id number]]  Attending Physician: Therisa Doyne [[id number]]          Obs      Level of care       tele indefinitely please discontinue once patient no longer qualifies  COVID-19 Labs    Hamp Moreland 06/20/2023, 8:29 PM    [facility]     after 2 AM please page floor coverage PA If 7AM-7PM, please contact the day team taking care of the patient using [email or web address]

## 2023-06-21 ENCOUNTER — Observation Stay (HOSPITAL_COMMUNITY): Payer: 59

## 2023-06-21 ENCOUNTER — Encounter (HOSPITAL_COMMUNITY): Payer: Self-pay | Admitting: Internal Medicine

## 2023-06-21 ENCOUNTER — Ambulatory Visit (HOSPITAL_COMMUNITY): Payer: 59

## 2023-06-21 DIAGNOSIS — N179 Acute kidney failure, unspecified: Secondary | ICD-10-CM | POA: Diagnosis not present

## 2023-06-21 DIAGNOSIS — G459 Transient cerebral ischemic attack, unspecified: Secondary | ICD-10-CM | POA: Diagnosis not present

## 2023-06-21 LAB — CBC
HCT: 30.1 % — ABNORMAL LOW (ref 39.0–52.0)
Hemoglobin: 9.1 g/dL — ABNORMAL LOW (ref 13.0–17.0)
MCH: 22.2 pg — ABNORMAL LOW (ref 26.0–34.0)
MCHC: 30.2 g/dL (ref 30.0–36.0)
MCV: 73.6 fL — ABNORMAL LOW (ref 80.0–100.0)
Platelets: 243 10*3/uL (ref 150–400)
RBC: 4.09 MIL/uL — ABNORMAL LOW (ref 4.22–5.81)
RDW: 16.5 % — ABNORMAL HIGH (ref 11.5–15.5)
WBC: 7.1 10*3/uL (ref 4.0–10.5)
nRBC: 0 % (ref 0.0–0.2)

## 2023-06-21 LAB — COMPREHENSIVE METABOLIC PANEL
ALT: 6 U/L (ref 0–44)
AST: 10 U/L — ABNORMAL LOW (ref 15–41)
Albumin: 3.5 g/dL (ref 3.5–5.0)
Alkaline Phosphatase: 78 U/L (ref 38–126)
Anion gap: 9 (ref 5–15)
BUN: 31 mg/dL — ABNORMAL HIGH (ref 8–23)
CO2: 23 mmol/L (ref 22–32)
Calcium: 8.8 mg/dL — ABNORMAL LOW (ref 8.9–10.3)
Chloride: 106 mmol/L (ref 98–111)
Creatinine, Ser: 1.41 mg/dL — ABNORMAL HIGH (ref 0.61–1.24)
GFR, Estimated: 50 mL/min — ABNORMAL LOW (ref >60)
Glucose, Bld: 107 mg/dL — ABNORMAL HIGH (ref 70–99)
Potassium: 3.9 mmol/L (ref 3.5–5.1)
Sodium: 138 mmol/L (ref 135–145)
Total Bilirubin: 0.8 mg/dL (ref <1.2)
Total Protein: 5.9 g/dL — ABNORMAL LOW (ref 6.5–8.1)

## 2023-06-21 LAB — MAGNESIUM: Magnesium: 2 mg/dL (ref 1.7–2.4)

## 2023-06-21 LAB — GLUCOSE, CAPILLARY
Glucose-Capillary: 104 mg/dL — ABNORMAL HIGH (ref 70–99)
Glucose-Capillary: 109 mg/dL — ABNORMAL HIGH (ref 70–99)
Glucose-Capillary: 109 mg/dL — ABNORMAL HIGH (ref 70–99)
Glucose-Capillary: 128 mg/dL — ABNORMAL HIGH (ref 70–99)
Glucose-Capillary: 168 mg/dL — ABNORMAL HIGH (ref 70–99)

## 2023-06-21 LAB — PREALBUMIN: Prealbumin: 26 mg/dL (ref 18–38)

## 2023-06-21 LAB — PHOSPHORUS: Phosphorus: 4.9 mg/dL — ABNORMAL HIGH (ref 2.5–4.6)

## 2023-06-21 LAB — OSMOLALITY: Osmolality: 302 mosm/kg — ABNORMAL HIGH (ref 275–295)

## 2023-06-21 LAB — VITAMIN B12: Vitamin B-12: 156 pg/mL — ABNORMAL LOW (ref 180–914)

## 2023-06-21 LAB — FOLATE: Folate: 34.2 ng/mL (ref >5.9)

## 2023-06-21 MED ORDER — FERROUS SULFATE 325 (65 FE) MG PO TABS: 325.0000 mg | ORAL_TABLET | ORAL | 1 refills | Status: DC

## 2023-06-21 MED ORDER — FOLIC ACID 1 MG PO TABS
1.0000 mg | ORAL_TABLET | Freq: Every day | ORAL | Status: DC
  Administered 2023-06-21: 1 mg via ORAL
  Filled 2023-06-21: qty 1

## 2023-06-21 MED ORDER — IOHEXOL 350 MG/ML SOLN
50.0000 mL | Freq: Once | INTRAVENOUS | Status: AC | PRN
  Administered 2023-06-21: 50 mL via INTRAVENOUS

## 2023-06-21 MED ORDER — STROKE: EARLY STAGES OF RECOVERY BOOK
Freq: Once | Status: AC
  Filled 2023-06-21: qty 1

## 2023-06-21 MED ORDER — VITAMIN B-12 1000 MCG PO TABS
1000.0000 ug | ORAL_TABLET | Freq: Every day | ORAL | 1 refills | Status: AC
Start: 2023-06-21 — End: 2023-08-20

## 2023-06-21 MED ORDER — FOLIC ACID 1 MG PO TABS: 1.0000 mg | ORAL_TABLET | Freq: Every day | ORAL | 1 refills | Status: AC

## 2023-06-21 MED ORDER — CYANOCOBALAMIN 1000 MCG/ML IJ SOLN
1000.0000 ug | Freq: Every day | INTRAMUSCULAR | Status: DC
  Administered 2023-06-21: 1000 ug via INTRAMUSCULAR
  Filled 2023-06-21: qty 1

## 2023-06-21 MED ORDER — FERROUS SULFATE 325 (65 FE) MG PO TABS
325.0000 mg | ORAL_TABLET | ORAL | Status: DC
  Administered 2023-06-21: 325 mg via ORAL
  Filled 2023-06-21: qty 1

## 2023-06-21 NOTE — Evaluation (Signed)
Occupational Therapy Evaluation Patient Details Name: Micheal [name]. MRN: [id number] DOB: Apr 28, 1941 Today's Date: 06/21/2023   History of Present Illness Pt is an 82 y/o M presenting to ED on 11/16 as code stroke with aphasia and R facial droop. CT head with no acute intracranial abnormality, admitted for AKI. PMH includes Parkinson's, CAD, CVA, DM, diabetic neuropathy, HLD, HTN, brain surgery for aneurysms, persistent A fib, CKD III   Clinical Impression   Pt reports ind at baseline with ADLs, although does have assist from spouse at times due to taking incr time. Pt uses rollator and/or power w/c at home for mobility. Pt currently needing set up - min A for ADLs, supervision for bed mobility and CGA for transfers with RW.Pt presenting with impairments listed below, will follow acutely. Recommend HHOT at d/c.        If plan is discharge home, recommend the following: A little help with walking and/or transfers;A little help with bathing/dressing/bathroom;Assistance with cooking/housework;Assist for transportation;Help with stairs or ramp for entrance    Functional Status Assessment  Patient has had a recent decline in their functional status and demonstrates the ability to make significant improvements in function in a reasonable and predictable amount of time.  Equipment Recommendations  None recommended by OT    Recommendations for Other Services PT consult     Precautions / Restrictions Precautions Precautions: Fall Restrictions Weight Bearing Restrictions: No      Mobility Bed Mobility Overal bed mobility: Needs Assistance Bed Mobility: Supine to Sit     Supine to sit: Supervision          Transfers Overall transfer level: Needs assistance Equipment used: Rolling walker (2 wheels) Transfers: Sit to/from Stand Sit to Stand: Contact guard assist                  Balance Overall balance assessment: Needs assistance Sitting-balance support: Feet  supported Sitting balance-Leahy Scale: Good     Standing balance support: During functional activity, Reliant on assistive device for balance Standing balance-Leahy Scale: Poor                             ADL either performed or assessed with clinical judgement   ADL Overall ADL's : Needs assistance/impaired Eating/Feeding: Set up   Grooming: Contact guard assist;Wash/dry face;Wash/dry hands   Upper Body Bathing: Minimal assistance   Lower Body Bathing: Minimal assistance   Upper Body Dressing : Minimal assistance   Lower Body Dressing: Minimal assistance   Toilet Transfer: Contact guard assist;Ambulation;Regular Toilet   Toileting- Clothing Manipulation and Hygiene: Contact guard assist       Functional mobility during ADLs: Contact guard assist;Rolling walker (2 wheels)       Vision Baseline Vision/History: 1 Wears glasses Additional Comments: 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, reports mild blurriness, will further assess     Perception Perception: Not tested       Praxis Praxis: Not tested       Pertinent Vitals/Pain Pain Assessment Pain Assessment: Faces Pain Score: 5  Faces Pain Scale: Hurts little more Pain Location: headache Pain Descriptors / Indicators: Discomfort Pain Intervention(s): Limited activity within patient's tolerance, Monitored during session, Repositioned     Extremity/Trunk Assessment Upper Extremity Assessment Upper Extremity Assessment: Generalized weakness (limited L shoulder mobility 2/2 RTC surgery)   Lower Extremity Assessment Lower Extremity Assessment: Defer to PT evaluation   Cervical / Trunk Assessment Cervical / Trunk Assessment: Kyphotic  Communication Communication Communication: No apparent difficulties   Cognition Arousal: Alert Behavior During Therapy: WFL for tasks assessed/performed Overall Cognitive Status: Within Functional Limits for tasks assessed                                  General Comments: sleepy, and is at times slow to respond     General Comments  VSS    Exercises     Shoulder Instructions      Home Living Family/patient expects to be discharged to:: Private residence Living Arrangements: Spouse/significant other Available Help at Discharge: Available PRN/intermittently Type of Home: House Home Access: Stairs to enter;Ramped entrance Entrance Stairs-Number of Steps: 2 Entrance Stairs-Rails: Right Home Layout: One level     Bathroom Shower/Tub: Chief Strategy Officer: Standard Bathroom Accessibility: Yes   Home Equipment: Shower seat;Hand held shower head;Electric scooter;Grab bars - tub/shower;Rollator (4 wheels);Cane - quad;Grab bars - toilet          Prior Functioning/Environment Prior Level of Function : Independent/Modified Independent;Driving             Mobility Comments: walker for short distances, and uses power w/c at home as needed also ADLs Comments: ind with ADLs, incr time, if spouse is present she will asssist due to pt needing increased, wife sets up pillbox        OT Problem List: Decreased strength;Decreased range of motion;Decreased activity tolerance;Impaired balance (sitting and/or standing);Impaired vision/perception;Decreased safety awareness;Impaired UE functional use      OT Treatment/Interventions: Therapeutic exercise;Self-care/ADL training;Neuromuscular education;Energy conservation;DME and/or AE instruction;Therapeutic activities;Cognitive remediation/compensation;Visual/perceptual remediation/compensation;Patient/family education;Balance training    OT Goals(Current goals can be found in the care plan section) Acute Rehab OT Goals Patient Stated Goal: none stated OT Goal Formulation: With patient Time For Goal Achievement: 07/05/23 Potential to Achieve Goals: Good ADL Goals Pt Will Perform Upper Body Dressing: with supervision;sitting Pt Will Perform Lower Body Dressing: with  supervision;sit to/from stand;sitting/lateral leans Pt Will Transfer to Toilet: with supervision;ambulating;regular height toilet Pt Will Perform Tub/Shower Transfer: Tub transfer;Shower transfer;with supervision;ambulating  OT Frequency: Min 1X/week    Co-evaluation              AM-PAC OT "6 Clicks" Daily Activity     Outcome Measure Help from another person eating meals?: A Little Help from another person taking care of personal grooming?: A Little Help from another person toileting, which includes using toliet, bedpan, or urinal?: A Little Help from another person bathing (including washing, rinsing, drying)?: A Little Help from another person to put on and taking off regular upper body clothing?: A Little Help from another person to put on and taking off regular lower body clothing?: A Little 6 Click Score: 18   End of Session Equipment Utilized During Treatment: Gait belt;Rolling walker (2 wheels) Nurse Communication: Mobility status  Activity Tolerance: Patient tolerated treatment well Patient left: Other (comment) (handoff to PT in room)  OT Visit Diagnosis: Unsteadiness on feet (R26.81);Other abnormalities of gait and mobility (R26.89);Muscle weakness (generalized) (M62.81)                Time: 0981-1914 OT Time Calculation (min): 26 min Charges:  OT General Charges $OT Visit: 1 Visit OT Evaluation $OT Eval Moderate Complexity: 1 Mod OT Treatments $Self Care/Home Management : 8-22 mins  Carver Fila, OTD, OTR/L SecureChat Preferred Acute Rehab [phone]   Carver Fila Koonce 06/21/2023, 12:23 PM

## 2023-06-21 NOTE — Progress Notes (Signed)
Patient ready for discharge to home; discharge instructions given and reviewed; Rx sent electronically; wife present at bedside; patient dressed and discharged out via wheelchair; accompanied home by his wife.

## 2023-06-21 NOTE — TOC Transition Note (Signed)
Transition of Care [facility]) - CM/SW Discharge Note   Patient Details  Name: Micheal [name]. MRN: [id number] Date of Birth: 03/19/1941  Transition of Care [facility]) CM/SW Contact:  Lawerance Sabal, RN Phone Number: 06/21/2023, 4:22 PM   Clinical Narrative:     Spoke w patient's wife to discuss [facility] set up. [facility] able to accept with next day start of care. Wife agreeable. No other TOC needs identified   Final next level of care: Home w Home Health Services Barriers to Discharge: No Barriers Identified   Patient Goals and CMS Choice CMS Medicare.gov Compare Post Acute Care list provided to:: Patient Choice offered to / list presented to : Patient  Discharge Placement                         Discharge Plan and Services Additional resources added to the After Visit Summary for                  DME Arranged: N/A           HH Agency: [facility] Date [facility] Agency Contacted: 06/21/23 Time HH Agency Contacted: 1622 Representative spoke with at [facility] Agency: Elnita Maxwell  Social Determinants of Health (SDOH) Interventions SDOH Screenings   Food Insecurity: No Food Insecurity (06/20/2023)  Housing: Low Risk  (06/20/2023)  Transportation Needs: No Transportation Needs (06/20/2023)  Utilities: Not At Risk (06/20/2023)  Tobacco Use: High Risk (02/25/2023)   Received from [facility]     Readmission Risk Interventions     No data to display

## 2023-06-21 NOTE — Evaluation (Signed)
Physical Therapy Evaluation Patient Details Name: Micheal [name]. MRN: [id number] DOB: 08/28/1940 Today's Date: 06/21/2023  History of Present Illness  Pt is an 82 y/o M presenting to ED on 11/16 as code stroke with aphasia and R facial droop. CT head with no acute intracranial abnormality, admitted for AKI. PMH includes Parkinson's, CAD, CVA, DM, diabetic neuropathy, HLD, HTN, brain surgery for aneurysms, persistent A fib, CKD III   Clinical Impression  At baseline pt amb with rollator or use electric w/c. Pt is indep with ADLs however requires increased time assist for set up. Pt with generalized weakness and fatigue compared to baseline however is functioning at contact guard. Recommend HHPT upon d/c to improve ambulation safety and tolerance. Acute PT to cont to follow.        If plan is discharge home, recommend the following: A little help with walking and/or transfers;A little help with bathing/dressing/bathroom;Assist for transportation;Help with stairs or ramp for entrance   Can travel by private vehicle        Equipment Recommendations None recommended by PT  Recommendations for Other Services       Functional Status Assessment Patient has had a recent decline in their functional status and demonstrates the ability to make significant improvements in function in a reasonable and predictable amount of time.     Precautions / Restrictions Precautions Precautions: Fall Required Braces or Orthoses: Other Brace Other Brace: has an AFO for L foot however doesn't like using it Restrictions Weight Bearing Restrictions: No      Mobility  Bed Mobility               General bed mobility comments: pt received sitting on commode    Transfers Overall transfer level: Needs assistance Equipment used: Rolling walker (2 wheels) Transfers: Sit to/from Stand Sit to Stand: Contact guard assist           General transfer comment: increased time, verbal cues for  safe hand placement, difficulty turning with RW    Ambulation/Gait Ambulation/Gait assistance: Contact guard assist Gait Distance (Feet): 100 Feet Assistive device: Rolling walker (2 wheels) Gait Pattern/deviations: Step-through pattern, Decreased stride length, Decreased dorsiflexion - left, Trunk flexed Gait velocity: dec Gait velocity interpretation: <1.31 ft/sec, indicative of household ambulator   General Gait Details: pt with tremor like gait pattern with noted L drop foot however pt able to clear toes. Pt requiring increased time to turn with RW, trunk flexion  Stairs            Wheelchair Mobility     Tilt Bed    Modified Rankin (Stroke Patients Only)       Balance Overall balance assessment: Needs assistance Sitting-balance support: Feet supported Sitting balance-Leahy Scale: Good     Standing balance support: During functional activity, Reliant on assistive device for balance Standing balance-Leahy Scale: Poor Standing balance comment: leaned on sink to wash hands s/p tolieting                             Pertinent Vitals/Pain Pain Assessment Pain Assessment: No/denies pain    Home Living Family/patient expects to be discharged to:: Private residence Living Arrangements: Spouse/significant other Available Help at Discharge: Available PRN/intermittently Type of Home: House Home Access: Stairs to enter;Ramped entrance Entrance Stairs-Rails: Right Entrance Stairs-Number of Steps: 2   Home Layout: One level Home Equipment: Shower seat;Hand held shower head;Electric scooter;Grab bars - tub/shower;Rollator (4 wheels);Cane - quad;Grab bars -  toilet Additional Comments: uses rollator or electric scooter at home    Prior Function Prior Level of Function : Independent/Modified Independent;Driving             Mobility Comments: walker for short distances, and uses power w/c at home as needed also ADLs Comments: ind with ADLs, incr time, if  spouse is present she will asssist due to pt needing increased, wife sets up pillbox     Extremity/Trunk Assessment   Upper Extremity Assessment Upper Extremity Assessment: Defer to OT evaluation    Lower Extremity Assessment Lower Extremity Assessment: Generalized weakness (impaired coordination at baseline d/t parkinson's, 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 Pt with difficulty opening R eye all the way from eye injury as a kid    Exercises     Assessment/Plan    PT Assessment Patient needs continued PT services  PT Problem List Decreased strength;Decreased activity tolerance;Decreased balance;Decreased mobility;Decreased coordination       PT Treatment Interventions DME instruction;Gait training;Functional mobility training;Therapeutic activities;Therapeutic exercise;Balance training;Neuromuscular re-education    PT Goals (Current goals can be found in the Care Plan section)  Acute Rehab PT Goals Patient Stated Goal: home PT Goal Formulation: With patient/family Time For Goal Achievement: 07/05/23 Potential to Achieve Goals: Good    Frequency Min 3X/week     Co-evaluation               AM-PAC PT "6 Clicks" Mobility  Outcome Measure Help needed turning from your back to your side while in a flat bed without using bedrails?: A Little Help needed moving from lying on your back to sitting on the side of a flat bed without using bedrails?: A  Little Help needed moving to and from a bed to a chair (including a wheelchair)?: A Little Help needed standing up from a chair using your arms (e.g., wheelchair or bedside chair)?: A Little Help needed to walk in hospital room?: A Little Help needed climbing 3-5 steps with a railing? : A Little 6 Click Score: 18    End of Session Equipment Utilized During Treatment: Gait belt Activity Tolerance: Patient tolerated treatment well Patient left: in bed;with call bell/phone within reach;with family/visitor present (sitting EOB to eat lunch) Nurse Communication: Mobility status PT Visit Diagnosis: Unsteadiness on feet (R26.81);Muscle weakness (generalized) (M62.81);Difficulty in walking, not elsewhere classified (R26.2)    Time: 1202-1217 PT Time Calculation (min) (ACUTE ONLY): 15 min   Charges:   PT Evaluation $PT Eval Low Complexity: 1 Low   PT General Charges $$ ACUTE PT VISIT: 1 Visit         Lewis Shock, PT, DPT Acute Rehabilitation Services Secure chat preferred Office #: [phone]   Iona Hansen 06/21/2023, 1:07 PM

## 2023-06-21 NOTE — Discharge Summary (Signed)
Physician Discharge Summary  Micheal [name] Micheal [name]. VHQ:[id number] DOB: [birth year] DOA: 06/20/2023  PCP: Simone Curia, MD  Admit date: 06/20/2023 Discharge date: 06/21/2023  Time spent: 40 minutes  Recommendations for Outpatient Follow-up:  Follow outpatient CBC/CMP  Follow with neurology outpatient as scheduled Follow parotid mass outpatient Follow b12, folate, iron def outpatient - workout additionally as indicated Consider follow up records to determine whether aneurysm clipping MRI compatible (patient's wife thinks she was told not to)   Discharge Diagnoses:  Principal Problem:   AKI (acute kidney injury) (HCC) Active Problems:   CAD (coronary artery disease)   Diabetes mellitus without complication (HCC)   Hyperlipidemia   Hypertension   Parkinson's disease (HCC)   Stage 3b chronic kidney disease (CKD) (HCC)   Hypomagnesemia   Stroke-like symptom   Discharge Condition: stable  Diet recommendation: heart healthy  Filed Weights   06/20/23 1400  Weight: 66.4 kg    History of present illness:   82 yo M with hx stroke, aneurysm s/p clipping, atrial fibrillation on eliquis, parkinson's disease and multiple other medical issue who presented with stroke like symptoms.  Unable to get MRI due to hx of aneurysm s/p clipping.  He's had CT head stable x2.  Plan is to continue current meds.  Outpatient home health.  Follow with outpatient neurology as scheduled.   See below for additional details  Hospital Course:  Assessment and Plan:  Stroke Like Symptoms TIA vs Small Ischemic Stroke Head CT without acute intracranial abnormality or significant interval change Repeat head CT 11/17 stable CTA head/neck without evidence of acute intracranial abnormality, mild atherosclerosis in the head/neck without [name] LVO or significant proximal stenosis.  R ICA aneurysm clipping without evidence of residual/recurrent aneurysm.  Wife notes being told to avoid MRI in past   Echo deferred per  neuro as last done in May A1c 6.3, LDL 17 (12/2022) Appreciate neurology assistance -> TIA vs small ischemic stroke (unable to confirm due to inability to get MRI imaging), acute onset of R sided weakness and slurred speech.  Remote lacunar infarct of external capsule.  Recommending continue eliquis.  Follow with [facility] outpatient.  He's on crestor.  Planning for home health  AKI on CKD IIIa Improved, follow outpatient  B12  Folate Iron Def Anemia Follow outpatient, send with supplementation  Atrial Fibrillation Eliquis, diltiazem  CAD Dyslipidemia Crestor  Hypertension Diltiazem, lasix  Parkinsonism Sinemet  Parotid Mass Chronic follow outpatient  Hx ICA Aneurysm Follow outpatient      Procedures: none   Consultations: neurology  Discharge Exam: Vitals:   06/21/23 1100 06/21/23 1619  BP: (!) 106/52 130/65  Pulse: 97 79  Resp: 16 17  Temp: 97.6 F (36.4 C) (!) 97.5 F (36.4 C)  SpO2: 96% 97%   No complaints He and his wife eager to get back home  General: No acute distress. Cardiovascular: RRR Lungs:  unlabored Neurological: chronic tremor, left sided facial droop  Extremities: No clubbing or cyanosis. No edema.  Discharge Instructions   Discharge Instructions     Call MD for:  difficulty breathing, headache or visual disturbances   Complete by: As directed    Call MD for:  extreme fatigue   Complete by: As directed    Call MD for:  hives   Complete by: As directed    Call MD for:  persistant dizziness or light-headedness   Complete by: As directed    Call MD for:  persistant nausea and vomiting   Complete  by: As directed    Call MD for:  redness, tenderness, or signs of infection (pain, swelling, redness, odor or green/yellow discharge around incision site)   Complete by: As directed    Call MD for:  severe uncontrolled pain   Complete by: As directed    Call MD for:  temperature >100.4   Complete by: As directed    Diet -  low sodium heart healthy   Complete by: As directed    Discharge instructions   Complete by: As directed    You were seen with concern for [name] TIA (transient ischemic attack) vs [name] small ischemic stroke.   You've had negative head CT's for stroke x2.  Unfortunately, we're unable to get an MRI with your aneurysm clip (please follow up with your outpatient provider regarding this recommendation).   Neurology recommends continuing eliquis and continuing your crestor.  You have iron, b12, and folate deficiency.  I'll send you home with B12, folate, and iron supplementation.  You'll need to follow up with your outpatient provider regarding these deficiencies and additional workup as indicated.   You have parotid masses, they're stable.  Please follow that with your outpatient doctor.   We'll arrange home health therapy services.  Return for new, recurrent, or worsening symptoms.  Please ask your PCP to request records from this hospitalization so they know what was done and what the next steps will be.   Increase activity slowly   Complete by: As directed       Allergies as of 06/21/2023   No Known Allergies      Medication List     TAKE these medications    acetaminophen 500 MG tablet Commonly known as: TYLENOL Take 1,000 mg by mouth daily.   albuterol 108 (90 Base) MCG/ACT inhaler Commonly known as: VENTOLIN HFA Inhale 2 puffs into the lungs every 8 (eight) hours as needed for shortness of breath.   ALPRAZolam 0.25 MG tablet Commonly known as: XANAX Take 0.25 mg by mouth every 6 (six) hours as needed for anxiety.   busPIRone 5 MG tablet Commonly known as: BUSPAR Take 5 mg by mouth 2 (two) times daily.   carbidopa-levodopa 25-100 MG tablet Commonly known as: SINEMET IR Take 3 tablets by mouth 3 (three) times daily. 0830, 1300, 2200   Cartia XT 120 MG 24 hr capsule Generic drug: diltiazem Take 1 capsule by mouth once daily   cyanocobalamin 1000 MCG tablet Commonly  known as: VITAMIN B12 Take 1 tablet (1,000 mcg total) by mouth daily. Follow with your PCP outpatient regarding your b12 deficiency and additional workup (and considering injections)   Eliquis 5 MG Tabs tablet Generic drug: apixaban Take 5 mg by mouth 2 (two) times daily.   ferrous sulfate 325 (65 FE) MG tablet Take 1 tablet (325 mg total) by mouth every other day. Follow up with your outpatient PCP for follow up and further workup of your iron deficiency anemia Start taking on: June 23, 2023   fluticasone 50 MCG/ACT nasal spray Commonly known as: FLONASE Place 2 sprays into both nostrils daily.   folic acid 1 MG tablet Commonly known as: FOLVITE Take 1 tablet (1 mg total) by mouth daily. Follow your folate deficiency with your PCP outpatient Start taking on: June 22, 2023   furosemide 20 MG tablet Commonly known as: LASIX Take 20 mg by mouth daily.   gabapentin 300 MG capsule Commonly known as: NEURONTIN Take 300 mg by mouth 3 (three) times daily as  needed (pain).   magnesium oxide 400 (240 Mg) MG tablet Commonly known as: MAG-OX Take 400 mg by mouth daily.   metFORMIN 850 MG tablet Commonly known as: GLUCOPHAGE Take 850 mg by mouth with breakfast, with lunch, and with evening meal.   multivitamin tablet Take 1 tablet by mouth daily.   omega-3 acid ethyl esters 1 g capsule Commonly known as: LOVAZA Take 1 capsule by mouth daily.   omeprazole 40 MG capsule Commonly known as: PRILOSEC Take 40 mg by mouth daily.   polycarbophil 625 MG tablet Commonly known as: FIBERCON Take 625 mg by mouth daily.   rOPINIRole 0.5 MG tablet Commonly known as: REQUIP Take 0.5 mg by mouth 3 (three) times daily.   rosuvastatin 20 MG tablet Commonly known as: CRESTOR Take 20 mg by mouth at bedtime.   tamsulosin 0.4 MG Caps capsule Commonly known as: FLOMAX Take 0.4 mg by mouth daily after breakfast.   testosterone cypionate 200 MG/ML injection Commonly known as:  DEPOTESTOSTERONE CYPIONATE Inject 200 mg into the muscle every 14 (fourteen) days.   Trelegy Ellipta 100-62.5-25 MCG/ACT Aepb Generic drug: Fluticasone-Umeclidin-Vilant Take 1 puff by mouth daily.   Voltaren Arthritis Pain 1 % Gel Generic drug: diclofenac Sodium Apply 2 g topically 2 (two) times daily as needed (pain).       No Known Allergies  Follow-up Information     [facility] Follow up.   Why: they will call and set up Cayleen Benjamin time to come out to your house Contact information: [address] [place] [address] [phone]                  The results of significant diagnostics from this hospitalization (including imaging, microbiology, ancillary and laboratory) are listed below for reference.    Significant Diagnostic Studies: CT ANGIO HEAD NECK W WO CM  Result Date: 06/21/2023 CLINICAL DATA:  Stroke/TIA, determine embolic source. EXAM: CT ANGIOGRAPHY HEAD AND NECK WITH AND WITHOUT CONTRAST TECHNIQUE: Multidetector CT imaging of the head and neck was performed using the standard protocol during bolus administration of intravenous contrast. Multiplanar CT image reconstructions and MIPs were obtained to evaluate the vascular anatomy. Carotid stenosis measurements (when applicable) are obtained utilizing NASCET criteria, using the distal internal carotid diameter as the denominator. RADIATION DOSE REDUCTION: This exam was performed according to the departmental dose-optimization program which includes automated exposure control, adjustment of the mA and/or kV according to patient size and/or use of iterative reconstruction technique. CONTRAST:  50mL OMNIPAQUE IOHEXOL 350 MG/ML SOLN COMPARISON:  CT head 06/21/2023.  CTA head and neck 12/21/2022. FINDINGS: CT HEAD FINDINGS Brain: There is no evidence of an acute infarct, intracranial hemorrhage, mass, midline shift, or extra-axial fluid collection. Right frontotemporal encephalomalacia is again noted  subjacent to Aragon Scarantino craniotomy. Alric Geise small chronic right cerebellar infarct is also again noted. Mild global cerebral atrophy is within normal limits for age. Vascular: Reported below. Skull: Right frontotemporal craniotomy. Sinuses/Orbits: Paranasal sinuses and mastoid air cells are largely clear. Bilateral cataract extraction. Other: None. Review of the MIP images confirms the above findings CTA NECK FINDINGS Aortic arch: Standard 3 vessel aortic arch with mild atherosclerosis. No significant stenosis of the arch vessel origins. Right carotid system: Patent with Abdias Hickam small amount of predominantly calcified plaque at the carotid bifurcation. No evidence of Danica Camarena significant stenosis or dissection. Partially retropharyngeal course of the mid cervical ICA. Left carotid system: Patent with Shallon Yaklin small amount of calcified plaque at the carotid bifurcation. No  evidence of Rhonna Holster significant stenosis or dissection. Partially retropharyngeal course of the mid cervical ICA. Vertebral arteries: Patent without evidence of stenosis or dissection. Mildly dominant left vertebral artery. Skeleton: Moderately advanced cervical disc degeneration. Asymmetrically advanced right facet arthrosis at C2-3. Other neck: Unchanged 14 mm right and 9 mm left parotid masses. 1.4 cm right thyroid nodule for which no follow-up imaging is recommended. Upper chest: Emphysema. Unchanged 7 mm nodule in the medial aspect of the left lung apex. Review of the MIP images confirms the above findings CTA HEAD FINDINGS Anterior circulation: The intracranial left ICA is patent with mild atherosclerotic calcification not resulting in significant stenosis. The intracranial right ICA is patent, however artifact from aneurysm clips obscure portions of the cavernous and paraclinoid segments. No residual/recurrent aneurysm is identified within limitations of artifact. ACAs and MCAs are patent without evidence of Paden Kuras proximal branch occlusion or significant proximal stenosis. Posterior  circulation: The intracranial vertebral arteries are widely patent to the basilar. Patent PICA and SCA origins are visualized bilaterally. The basilar artery is widely patent. There is Hobie Kohles small left posterior communicating artery. Both PCAs are patent without evidence of Tamu Golz significant proximal stenosis. No aneurysm is identified. Venous sinuses: As permitted by contrast timing, patent. Anatomic variants: None. Review of the MIP images confirms the above findings IMPRESSION: 1. No evidence of acute intracranial abnormality. 2. Mild atherosclerosis in the head and neck without Makhiya Coburn large vessel occlusion or significant proximal stenosis. 3. Right ICA aneurysm clipping without evidence of residual/recurrent aneurysm. 4. Unchanged 14 mm right and 9 mm left parotid masses, nonspecific but Filemon Breton benign etiology such as Warthin's tumors is favored. 5. Aortic Atherosclerosis (ICD10-I70.0) and Emphysema (ICD10-J43.9). Electronically Signed   By: Sebastian Ache M.D.   On: 06/21/2023 14:45   CT HEAD WO CONTRAST ( )  Result Date: 06/21/2023 CLINICAL DATA:  Follow-up examination for acute TIA. EXAM: CT HEAD WITHOUT CONTRAST TECHNIQUE: Contiguous axial images were obtained from the base of the skull through the vertex without intravenous contrast. RADIATION DOSE REDUCTION: This exam was performed according to the departmental dose-optimization program which includes automated exposure control, adjustment of the mA and/or kV according to patient size and/or use of iterative reconstruction technique. COMPARISON:  Prior CT from 06/20/2023. FINDINGS: Brain: Postoperative changes from prior right pterional craniotomy for surgical clipping of right ICA terminus aneurysm. Underlying chronic encephalomalacia at the chronic encephalomalacia at the underlying right frontotemporal region. Small remote right cerebellar infarct noted. No acute intracranial hemorrhage. No acute large vessel territory infarct. No mass lesion or midline shift. No  hydrocephalus or extra-axial fluid collection. Vascular: Dense streak artifact from aneurysm clips at the right ICA terminus. No hyperdense vessel. Skull: Scalp soft tissues within normal limits. Prior craniotomy changes on the right. Calvarium otherwise intact. Sinuses/Orbits: Globes orbital soft tissues within normal limits. Trace layering secretions noted within the right sphenoid sinus. Paranasal sinuses are otherwise clear. No mastoid effusion. Other: None. IMPRESSION: 1. Stable head CT.  No acute intracranial abnormality. 2. Postoperative changes from prior right pterional craniotomy for surgical clipping of right ICA terminus aneurysm. Chronic encephalomalacia at the underlying right frontotemporal region. 3. Small remote right cerebellar infarct. Electronically Signed   By: Rise Mu M.D.   On: 06/21/2023 01:06   DG CHEST PORT 1 VIEW  Result Date: 06/20/2023 CLINICAL DATA:  Stroke EXAM: PORTABLE CHEST 1 VIEW COMPARISON:  12/21/2022 FINDINGS: Single frontal view of the chest demonstrates Kathaleya Mcduffee stable cardiac silhouette. Bibasilar hypoventilatory changes. No airspace disease, effusion, or pneumothorax.  No acute bony abnormalities. IMPRESSION: 1. No acute intrathoracic process. Electronically Signed   By: Sharlet Salina M.D.   On: 06/20/2023 20:13   CT ABDOMEN PELVIS WO CONTRAST  Result Date: 06/20/2023 CLINICAL DATA:  Acute abdominal pain. Nephrolithiasis. Prior appendectomy. EXAM: CT ABDOMEN AND PELVIS WITHOUT CONTRAST TECHNIQUE: Multidetector CT imaging of the abdomen and pelvis was performed following the standard protocol without IV contrast. RADIATION DOSE REDUCTION: This exam was performed according to the departmental dose-optimization program which includes automated exposure control, adjustment of the mA and/or kV according to patient size and/or use of iterative reconstruction technique. COMPARISON:  11/01/2022 FINDINGS: Lower chest: No acute findings. Hepatobiliary: No mass  visualized on this unenhanced exam. Gallbladder is unremarkable. No evidence of biliary ductal dilatation. Pancreas: No mass or inflammatory process visualized on this unenhanced exam. Spleen:  Within normal limits in size. Adrenals/Urinary tract: Multiple small less than 1 cm renal calculi are seen bilaterally. No evidence of urolithiasis or hydronephrosis. Unremarkable unopacified urinary bladder. Stomach/Bowel: No evidence of obstruction, inflammatory process, or abnormal fluid collections. Diverticulosis is seen mainly involving the sigmoid colon, however there is no evidence of diverticulitis. Vascular/Lymphatic: No pathologically enlarged lymph nodes identified. No evidence of abdominal aortic aneurysm. Reproductive:  No mass or other significant abnormality. Other:  None. Musculoskeletal:  No suspicious bone lesions identified. IMPRESSION: Bilateral nephrolithiasis. No evidence of urolithiasis, hydronephrosis, or other acute findings. Colonic diverticulosis, without radiographic evidence of diverticulitis. Electronically Signed   By: Danae Orleans M.D.   On: 06/20/2023 17:17   DG Hip Unilat With Pelvis 2-3 Views Right  Result Date: 06/20/2023 CLINICAL DATA:  Right hip pain. EXAM: DG HIP (WITH OR WITHOUT PELVIS) 2-3V RIGHT COMPARISON:  None Available. FINDINGS: There is no evidence of hip fracture or dislocation. There is no evidence of arthropathy or other focal bone abnormality. IMPRESSION: Negative. Electronically Signed   By: Ted Mcalpine M.D.   On: 06/20/2023 16:47   DG Knee Complete 4 Views Right  Result Date: 06/20/2023 CLINICAL DATA:  Right hip and right knee pain. EXAM: RIGHT KNEE - COMPLETE 4+ VIEW COMPARISON:  None Available. FINDINGS: No evidence of fracture, dislocation, or joint effusion. No evidence of other focal bone abnormality. Mild osteoarthritic changes of the right knee. Soft tissues are unremarkable. IMPRESSION: Negative. Electronically Signed   By: Ted Mcalpine  M.D.   On: 06/20/2023 16:41   CT HEAD CODE STROKE WO CONTRAST  Result Date: 06/20/2023 CLINICAL DATA:  Code stroke. Neuro deficit, acute, stroke suspected. EXAM: CT HEAD WITHOUT CONTRAST TECHNIQUE: Contiguous axial images were obtained from the base of the skull through the vertex without intravenous contrast. RADIATION DOSE REDUCTION: This exam was performed according to the departmental dose-optimization program which includes automated exposure control, adjustment of the mA and/or kV according to patient size and/or use of iterative reconstruction technique. COMPARISON:  CT head without contrast 5//24 FINDINGS: Brain: No acute infarct, hemorrhage, or mass lesion is present. Remote lacunar infarct in the right external capsule is stable. Mild encephalomalacia of the right frontal operculum is stable. Patient is status post right pterional craniotomy for clipping of Deontay Ladnier right ICA aneurysm. Mild atrophy and white matter changes are present bilaterally. Basal ganglia are otherwise within normal limits. The brainstem and cerebellum are within normal limits. Midline structures are within normal limits. Vascular: Multiple clips are again noted at the level of the ophthalmic segment of the right internal carotid artery. Calcifications are present within the cavernous internal carotid arteries bilaterally. No hyperdense vessel is  present. Skull: Right pterional craniotomy noted. Calvarium is otherwise within normal limits. Focal soft tissue swelling/hematoma is present in the high right parietal scalp without underlying fracture. No foreign body or laceration is evident. Sinuses/Orbits: The paranasal sinuses and mastoid air cells are clear. The globes and orbits are within normal limits. ASPECTS [facility] Stroke Program Early CT Score) - Ganglionic level infarction (caudate, lentiform nuclei, internal capsule, insula, M1-M3 cortex): 7/7 - Supraganglionic infarction (M4-M6 cortex): 3/3 Total score (0-10 with 10 being  normal): 10/10 IMPRESSION: 1. No acute intracranial abnormality or significant interval change. 2. Focal soft tissue swelling/hematoma in the high right parietal scalp without underlying fracture. 3. Stable remote lacunar infarct of the right external capsule. 4. Stable mild encephalomalacia of the right frontal operculum. 5. Status post right pterional craniotomy for clipping of Florence Antonelli right ICA aneurysm. 6. Aspects is 10/10. The above was relayed via text pager to Dr. Otelia Limes on 06/20/2023 at 15:02 . Electronically Signed   By: Marin Roberts M.D.   On: 06/20/2023 15:04    Microbiology: No results found for this or any previous visit (from the past 240 hour(s)).   Labs: Basic Metabolic Panel: Recent Labs  Lab 06/20/23 1435 06/20/23 1436 06/20/23 2247 06/21/23 0401  NA 142 143  --  138  K 4.5 4.2  --  3.9  CL 105 103  --  106  CO2 22  --   --  23  GLUCOSE 121* 116*  --  107*  BUN 41* 38*  --  31*  CREATININE 1.81* 1.90*  --  1.41*  CALCIUM 9.3  --   --  8.8*  MG 1.6*  --   --  2.0  PHOS  --   --  4.4 4.9*   Liver Function Tests: Recent Labs  Lab 06/20/23 1435 06/21/23 0401  AST 16 10*  ALT <5 6  ALKPHOS 82 78  BILITOT 0.5 0.8  PROT 6.9 5.9*  ALBUMIN 4.1 3.5   Recent Labs  Lab 06/20/23 1435  LIPASE 34   Recent Labs  Lab 06/20/23 2100  AMMONIA <10   CBC: Recent Labs  Lab 06/20/23 1435 06/20/23 1436 06/21/23 0401  WBC 8.4  --  7.1  NEUTROABS 5.7  --   --   HGB 9.9* 11.2* 9.1*  HCT 33.1* 33.0* 30.1*  MCV 74.7*  --  73.6*  PLT 270  --  243   Cardiac Enzymes: Recent Labs  Lab 06/20/23 1435  CKTOTAL 40*   BNP: BNP (last 3 results) No results for input(s): "BNP" in the last 8760 hours.  ProBNP (last 3 results) No results for input(s): "PROBNP" in the last 8760 hours.  CBG: Recent Labs  Lab 06/21/23 0036 06/21/23 0441 06/21/23 0835 06/21/23 1128 06/21/23 1618  GLUCAP 109* 109* 104* 168* 128*       Signed:  Lacretia Nicks MD.  [facility] 06/21/2023, 4:51 PM
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
## 2023-06-21 NOTE — Progress Notes (Addendum)
STROKE TEAM PROGRESS NOTE   BRIEF HPI Mr. Micheal [name]. is a 82 y.o. male with history of Parkinson's disease, CAD, DM, diabetic neuropathy, HLD, HTN, prior brain surgery for aneurysms, persistent atrial fibrillation (on Eliquis), prior stroke in 2002 and CKD3 who presents to the ED via EMS as a Code Stroke after acute onset of facial droop and slurred speech at home. LKN 0700. He was at home and called his wife for help; she noted that he was having difficulty speaking and some left-sided facial droop. EMS was called. On arrival they noted tremor, slurred speech and facial droop. He was having difficulty formulating his words and speaking in complete sentences. He was complaining of sharp pain to his occiput, rated as 8/10. Vitals per EMS: O2 sats 100%, CBG 163, BP 119/61, HR 60-110. Code Stroke was called in the field.    Home medications include Sinemet for his Parkinson's. He states that he has been compliant.   NIH on Admission 9  SIGNIFICANT HOSPITAL EVENTS CT Head: Negative for acute abnormality , remote lacunar infract of the right external capsule   INTERIM HISTORY/SUBJECTIVE Wife is bedside for support. Patient was more confused, slurred speech and facial droop. Wife feels patients confusion and speech is improving. Denies seizure or loss of consciousness. Patient has clips in for aneurysmal repair and unsure if compatible with MRI. Wife has reported tremors and writing mouth movements, suspects due to the parkinson medications and adjustments. Also reports limited left arm movement due to prior shoulder surgery.    OBJECTIVE  CBC    Component Value Date/Time   WBC 7.1 06/21/2023 0401   RBC 4.09 (L) 06/21/2023 0401   HGB 9.1 (L) 06/21/2023 0401   HGB 12.2 (L) 01/09/2022 1030   HCT 30.1 (L) 06/21/2023 0401   HCT 35.9 (L) 01/09/2022 1030   PLT 243 06/21/2023 0401   PLT 273 01/09/2022 1030   MCV 73.6 (L) 06/21/2023 0401   MCV 89 01/09/2022 1030   MCH 22.2 (L) 06/21/2023  0401   MCHC 30.2 06/21/2023 0401   RDW 16.5 (H) 06/21/2023 0401   RDW 12.8 01/09/2022 1030   LYMPHSABS 2.0 06/20/2023 1435   MONOABS 0.6 06/20/2023 1435   EOSABS 0.1 06/20/2023 1435   BASOSABS 0.0 06/20/2023 1435    BMET    Component Value Date/Time   NA 138 06/21/2023 0401   NA 140 01/09/2022 1030   K 3.9 06/21/2023 0401   CL 106 06/21/2023 0401   CO2 23 06/21/2023 0401   GLUCOSE 107 (H) 06/21/2023 0401   BUN 31 (H) 06/21/2023 0401   BUN 27 01/09/2022 1030   CREATININE 1.41 (H) 06/21/2023 0401   CALCIUM 8.8 (L) 06/21/2023 0401   EGFR 49 (L) 01/09/2022 1030   GFRNONAA 50 (L) 06/21/2023 0401    IMAGING past 24 hours CT HEAD WO CONTRAST ( )  Result Date: 06/21/2023 CLINICAL DATA:  Follow-up examination for acute TIA. EXAM: CT HEAD WITHOUT CONTRAST TECHNIQUE: Contiguous axial images were obtained from the base of the skull through the vertex without intravenous contrast. RADIATION DOSE REDUCTION: This exam was performed according to the departmental dose-optimization program which includes automated exposure control, adjustment of the mA and/or kV according to patient size and/or use of iterative reconstruction technique. COMPARISON:  Prior CT from 06/20/2023. FINDINGS: Brain: Postoperative changes from prior right pterional craniotomy for surgical clipping of right ICA terminus aneurysm. Underlying chronic encephalomalacia at the chronic encephalomalacia at the underlying right frontotemporal region. Small remote right cerebellar  infarct noted. No acute intracranial hemorrhage. No acute large vessel territory infarct. No mass lesion or midline shift. No hydrocephalus or extra-axial fluid collection. Vascular: Dense streak artifact from aneurysm clips at the right ICA terminus. No hyperdense vessel. Skull: Scalp soft tissues within normal limits. Prior craniotomy changes on the right. Calvarium otherwise intact. Sinuses/Orbits: Globes orbital soft tissues within normal limits. Trace  layering secretions noted within the right sphenoid sinus. Paranasal sinuses are otherwise clear. No mastoid effusion. Other: None. IMPRESSION: 1. Stable head CT.  No acute intracranial abnormality. 2. Postoperative changes from prior right pterional craniotomy for surgical clipping of right ICA terminus aneurysm. Chronic encephalomalacia at the underlying right frontotemporal region. 3. Small remote right cerebellar infarct. Electronically Signed   By: Rise Mu M.D.   On: 06/21/2023 01:06   DG CHEST PORT 1 VIEW  Result Date: 06/20/2023 CLINICAL DATA:  Stroke EXAM: PORTABLE CHEST 1 VIEW COMPARISON:  12/21/2022 FINDINGS: Single frontal view of the chest demonstrates a stable cardiac silhouette. Bibasilar hypoventilatory changes. No airspace disease, effusion, or pneumothorax. No acute bony abnormalities. IMPRESSION: 1. No acute intrathoracic process. Electronically Signed   By: Sharlet Salina M.D.   On: 06/20/2023 20:13   CT ABDOMEN PELVIS WO CONTRAST  Result Date: 06/20/2023 CLINICAL DATA:  Acute abdominal pain. Nephrolithiasis. Prior appendectomy. EXAM: CT ABDOMEN AND PELVIS WITHOUT CONTRAST TECHNIQUE: Multidetector CT imaging of the abdomen and pelvis was performed following the standard protocol without IV contrast. RADIATION DOSE REDUCTION: This exam was performed according to the departmental dose-optimization program which includes automated exposure control, adjustment of the mA and/or kV according to patient size and/or use of iterative reconstruction technique. COMPARISON:  11/01/2022 FINDINGS: Lower chest: No acute findings. Hepatobiliary: No mass visualized on this unenhanced exam. Gallbladder is unremarkable. No evidence of biliary ductal dilatation. Pancreas: No mass or inflammatory process visualized on this unenhanced exam. Spleen:  Within normal limits in size. Adrenals/Urinary tract: Multiple small less than 1 cm renal calculi are seen bilaterally. No evidence of urolithiasis  or hydronephrosis. Unremarkable unopacified urinary bladder. Stomach/Bowel: No evidence of obstruction, inflammatory process, or abnormal fluid collections. Diverticulosis is seen mainly involving the sigmoid colon, however there is no evidence of diverticulitis. Vascular/Lymphatic: No pathologically enlarged lymph nodes identified. No evidence of abdominal aortic aneurysm. Reproductive:  No mass or other significant abnormality. Other:  None. Musculoskeletal:  No suspicious bone lesions identified. IMPRESSION: Bilateral nephrolithiasis. No evidence of urolithiasis, hydronephrosis, or other acute findings. Colonic diverticulosis, without radiographic evidence of diverticulitis. Electronically Signed   By: Danae Orleans M.D.   On: 06/20/2023 17:17   DG Hip Unilat With Pelvis 2-3 Views Right  Result Date: 06/20/2023 CLINICAL DATA:  Right hip pain. EXAM: DG HIP (WITH OR WITHOUT PELVIS) 2-3V RIGHT COMPARISON:  None Available. FINDINGS: There is no evidence of hip fracture or dislocation. There is no evidence of arthropathy or other focal bone abnormality. IMPRESSION: Negative. Electronically Signed   By: Ted Mcalpine M.D.   On: 06/20/2023 16:47   DG Knee Complete 4 Views Right  Result Date: 06/20/2023 CLINICAL DATA:  Right hip and right knee pain. EXAM: RIGHT KNEE - COMPLETE 4+ VIEW COMPARISON:  None Available. FINDINGS: No evidence of fracture, dislocation, or joint effusion. No evidence of other focal bone abnormality. Mild osteoarthritic changes of the right knee. Soft tissues are unremarkable. IMPRESSION: Negative. Electronically Signed   By: Ted Mcalpine M.D.   On: 06/20/2023 16:41   CT HEAD CODE STROKE WO CONTRAST  Result Date: 06/20/2023 CLINICAL  DATA:  Code stroke. Neuro deficit, acute, stroke suspected. EXAM: CT HEAD WITHOUT CONTRAST TECHNIQUE: Contiguous axial images were obtained from the base of the skull through the vertex without intravenous contrast. RADIATION DOSE REDUCTION:  This exam was performed according to the departmental dose-optimization program which includes automated exposure control, adjustment of the mA and/or kV according to patient size and/or use of iterative reconstruction technique. COMPARISON:  CT head without contrast 5//24 FINDINGS: Brain: No acute infarct, hemorrhage, or mass lesion is present. Remote lacunar infarct in the right external capsule is stable. Mild encephalomalacia of the right frontal operculum is stable. Patient is status post right pterional craniotomy for clipping of a right ICA aneurysm. Mild atrophy and white matter changes are present bilaterally. Basal ganglia are otherwise within normal limits. The brainstem and cerebellum are within normal limits. Midline structures are within normal limits. Vascular: Multiple clips are again noted at the level of the ophthalmic segment of the right internal carotid artery. Calcifications are present within the cavernous internal carotid arteries bilaterally. No hyperdense vessel is present. Skull: Right pterional craniotomy noted. Calvarium is otherwise within normal limits. Focal soft tissue swelling/hematoma is present in the high right parietal scalp without underlying fracture. No foreign body or laceration is evident. Sinuses/Orbits: The paranasal sinuses and mastoid air cells are clear. The globes and orbits are within normal limits. ASPECTS [facility] Stroke Program Early CT Score) - Ganglionic level infarction (caudate, lentiform nuclei, internal capsule, insula, M1-M3 cortex): 7/7 - Supraganglionic infarction (M4-M6 cortex): 3/3 Total score (0-10 with 10 being normal): 10/10 IMPRESSION: 1. No acute intracranial abnormality or significant interval change. 2. Focal soft tissue swelling/hematoma in the high right parietal scalp without underlying fracture. 3. Stable remote lacunar infarct of the right external capsule. 4. Stable mild encephalomalacia of the right frontal operculum. 5. Status post right  pterional craniotomy for clipping of a right ICA aneurysm. 6. Aspects is 10/10. The above was relayed via text pager to Dr. Otelia Limes on 06/20/2023 at 15:02 . Electronically Signed   By: Marin Roberts M.D.   On: 06/20/2023 15:04    Vitals:   06/21/23 0350 06/21/23 0442 06/21/23 0836 06/21/23 1100  BP: 118/60 116/68 (!) 137/53 (!) 106/52  Pulse: 85 75 94 97  Resp: 18 17 17 16   Temp: 98.4 F (36.9 C) 98 F (36.7 C) 98.5 F (36.9 C) 97.6 F (36.4 C)  TempSrc: Oral Oral Oral Oral  SpO2: 97% 96% 97% 96%  Weight:      Height:         PHYSICAL EXAM General:  Alert, well-nourished, well-developed patient in no acute distress Psych:  Mood and affect appropriate for situation Respiratory:  Regular, unlabored respirations on room air GI: Abdomen soft and nontender   NEURO:  Mental Status: AA&Ox3, patient still with mild confusion  Speech/Language: speech mild dysarthria and stuttering,  no aphasia. Comprehension intact Cranial Nerves:  II: PERRL III, IV, VI: EOMI. Eyelids elevate symmetrically.  V: Decreased sensation in right face  VII: Left sided facial droop resting but is chronic (per wife) VIII: hearing intact to voice. IX, X: Palate elevates symmetrically. Mild hypophonation  PI:[id number] shrug 4/5. XII: tongue is midline without fasciculations. Motor: 4/5 strength to all muscle groups tested. ROM of decreased in left upper extremity due to recent shoulder surgery  Tone: is normal and bulk is normal Sensation- Decreased sensation in right lower extremity   Coordination: FTN intact bilaterally,worse on R. BLE.No drift.  Gait- deferred   ASSESSMENT/PLAN  TIA  vs Small ischemic stroke, unable to confirm duet to inability to get MRI imaging   Acute onset of right sided weakness and slurred speech at home  Remote lacunar infarct of the external capsul  Etiology:   Code Stroke. CT head No acute abnormality. Remote lacunar infract of the right external capsule. ASPECTS  10 CTA head & neck deferred given current AKI, can consider ordering if renal function improves assess for possible left PCA occlusion  Repeat CT Head and CT A, stable from prior CT and negative for acute process  MRI deferred due to titanium aneurysm clips placed in 2002 2D Echo deferred, last completed in May and was unremarkable, no need to repeat unless signs/symptoms of CHF or concern for structural abnormality  LDL 17 HgbA1c 6.3 VTE prophylaxis - Eliquis Eliquis (apixaban) daily prior to admission and continue with eliquis Patient is established with [facility], has a follow-up appointment on 06/26/23, stroke will sign off given negative CT x2 and be available if any new concerns arise  Therapy recommendations:  Home Health PT and OT services  Disposition:  pending   Hx of Stroke/TIA Prior stroke in 2002   Atrial fibrillation Home Meds: Eliquis 5 mg BID and diltiazem 120 mg daily  Continue telemetry monitoring Continue home meds  Hypertension Home meds: Diltiazem 120 mg daily Resume home medications   Blood Pressure Goal: BP less than 180/105   Hyperlipidemia Home meds: Crestor 20 mg daily  resumed in hospital LDL 17, goal < 70 Continue home statin at discharge  Diabetes type II Controlled Home meds:  Metformin 850 mg TID (holding while inpatient)  HgbA1c 6.3, goal < 7.0 CBGs SSI Recommend close follow-up with PCP for better DM control  Other Stroke Risk Factors Congestive heart failure  Other Active Problems Anxiety: Buspar  Parkinsons: Sinemet, requip resumed  COPD: Breo-ellipta, Incruse ellipta, proventil resumed  GERD: Protonix resumed   Hospital day # 1    ATTENDING NOTE: I reviewed above note and agree with the assessment and plan. Pt was seen and examined.  Together with the NP/PA, we formulated the assessment and plan of care which reflects our mutual decision.  I have made any additions or clarifications directly to the above note and agree  with the findings and plan as currently documented. I spent a total of 35 minutes dedicated to the care of this patient.  For detailed assessment and plan, please refer to above/below as I have made changes wherever appropriate.   Windell Norfolk, MD  Stroke Neurology 06/21/2023 3:15 PM   To contact Stroke Continuity provider, please refer to [email or web address]. After hours, contact General Neurology

## 2023-06-21 NOTE — Progress Notes (Addendum)
Patient alert and communicating clearly; wife present at bedside; preformed a repeat Yale swallow screen and he passed; paged MD for diet orders.

## 2024-01-04 DIAGNOSIS — I340 Nonrheumatic mitral (valve) insufficiency: Secondary | ICD-10-CM | POA: Diagnosis not present

## 2024-01-04 DIAGNOSIS — I361 Nonrheumatic tricuspid (valve) insufficiency: Secondary | ICD-10-CM | POA: Diagnosis not present

## 2024-01-07 NOTE — Progress Notes (Signed)
 Cardiology Office Note   Date:  01/10/2024  ID:  Micheal [name]., DOB [birth year], MRN [id number] PCP: Angelique Barer, MD  [facility] Providers Cardiologist:  Nelia Balzarine, MD     History of Present Illness Micheal [name]. is a 83 y.o. male  with a past medical history of CAD, hypertension, permanent atrial fibrillation on Elqius, DM2, Parkinson's disease, CKD 3b, BPH, history of stroke, cerebral aneurysm s/p clipping, dyslipidemia.  01/05/2024 echo EF 45 to 50%, mild to moderate MR 12/22/2022 echo EF 55-60%, mild MR, mild calcification of the aortic valve 01/15/2022 cardiac cath Mid LM to Dist LM lesion is 40% stenosed with 70% stenosed side branch in Ost Cx to Prox Cx, not favorable for PCI, medical therapy 12/04/2021 Lexiscan  abnormal   He established care with Dr. Lafayette Pierre in 2023 after an abnormal stress test. He subsequently underwent a cardiac cath in June 2023 revealing Mid LM to Dist LM lesion is 40% stenosed with 70% stenosed side branch in Ost Cx to Prox Cx, but not amenable for PCI and recommendations for medial therapy. Most recently he was evaluated by our office on 10/2022, was stable from a cardiac perspective, no changes to plan of care and advised to follow up in 6 months.   He presents today accompanied by his wife for follow-up of his CAD and atrial fibrillation.  He mentions he was recently admitted to [facility] for a near syncopal episode, He had apparently been sitting in his wheelchair and passed out striking his head, upon arrival in the ED a code stroke was called, CT was negative, not able to undergo MRI secondary to prior aneurysmal clipping and subsequently was advised there had not been a stroke.  He had an echocardiogram revealing EF of 45 to 50% with mild to moderate mitral regurgitation he received gentle IV fluids and was subsequently discharged home with home health PT and OT services.  Today, he is feeling okay, not quite back to his baseline.   He is trying to stay well-hydrated.  We discussed the slightly diminished EF, he is overall asymptomatic from that perspective.  He has seen his PCP since his ED visit and repeat labs were obtained. He denies chest pain, palpitations, dyspnea, pnd, orthopnea, n, v, edema, weight gain, or early satiety.    ROS: Review of Systems  Constitutional:  Positive for malaise/fatigue.  Neurological:  Positive for tremors.     Studies Reviewed EKG Interpretation Date/Time:  Friday January 08 2024 15:37:52 EDT Ventricular Rate:  98 PR Interval:    QRS Duration:  80 QT Interval:  344 QTC Calculation: 439 R Axis:   -24  Text Interpretation: Atrial fibrillation with premature ventricular or aberrantly conducted complexes When compared with ECG of 20-Jun-2023 15:02, PREVIOUS ECG IS PRESENT Confirmed by Pattricia Bores [phone]) on 01/08/2024 3:54:47 PM    Cardiac Studies & Procedures   ______________________________________________________________________________________________ CARDIAC CATHETERIZATION  CARDIAC CATHETERIZATION 01/15/2022  Conclusion   Mid LM to Dist LM lesion is 40% stenosed with 70% stenosed side branch in Ost Cx to Prox Cx. - NOT FAVORABLE FOR PCI - no safe landing zone for stent   Ost LAD to Mid LAD lesion is 10% stenosed.   LV end diastolic pressure is mildly elevated.   There is no aortic valve stenosis.    PLAN OF CARE: Discharge to home after PACU The culprit LCx lesion is not favorable for PCI, but he is not having symptoms. The patient is a 83 year old  gentleman who is preop for shoulder surgery -he is able to ride a stationary bicycle for 30 minutes without angina -- I would not recommend further treatment other than medical management. would recommend proceeding to surgery with no further cardiac evaluation other than potentially considering beta-blocker if there is time to initiate prior to surgery.  Findings Coronary Findings Diagnostic  Dominance: Co-dominant  Left  Main Vessel was injected. Vessel is large. There is moderate focal disease in the vessel. Mid LM to Dist LM lesion is 40% stenosed with 70% stenosed side branch in Ost Cx to Prox Cx. The lesion is located at the bifurcation, focal and smooth. The ostial LCx lesion-would explain the lateral ischemia on Myoview .  Not favorable for PCI -&gt; would not be able to land a stent and not jeopardize the LAD.  With existing LM disease, would not recommend PCI.  Left Anterior Descending Vessel is large. Ost LAD to Mid LAD lesion is 10% stenosed.  First Diagonal Branch Vessel is small in size.  Second Diagonal Branch Vessel is small in size.  Left Circumflex Vessel is large.  First Obtuse Marginal Branch Vessel is small in size.  Second Obtuse Marginal Branch Vessel is small in size.  Third Obtuse Marginal Branch Vessel is large in size.  First Left Posterolateral Branch Vessel is small in size.  Left Posterior Atrioventricular Artery Vessel is large in size.  Right Coronary Artery Vessel was injected. Vessel is large. The vessel exhibits minimal luminal irregularities.  Right Ventricular Branch Vessel is small in size.  Right Posterior Descending Artery Vessel is moderate in size.  Right Posterior Atrioventricular Artery Vessel is small in size.  First Right Posterolateral Branch Vessel is small in size.  Intervention  No interventions have been documented.     ECHOCARDIOGRAM  ECHOCARDIOGRAM COMPLETE 12/22/2022  Narrative ECHOCARDIOGRAM REPORT    Patient Name:   Micheal [name]. Date of Exam: 12/22/2022 Medical Rec #:  [id number]            Height:       66.0 in Accession #:    [id number]           Weight:       157.4 lb Date of Birth:  1941/03/07           BSA:          1.806 m Patient Age:    84 years             BP:           110/82 mmHg Patient Gender: M                    HR:           102 bpm. Exam Location:  Inpatient  Procedure: 2D Echo, Cardiac  Doppler and Color Doppler  Indications:    Stroke  History:        Patient has prior history of Echocardiogram examinations, most recent 05/20/2021. CAD, Stroke; Risk Factors:Diabetes, Dyslipidemia and Hypertension.  Sonographer:    Marino Sias Referring Phys: [phone] JULIE HAVILAND   Sonographer Comments: Patient sent from ED without an IV. Bubble study could not be completed. IMPRESSIONS   1. Left ventricular ejection fraction, by estimation, is 55 to 60%. The left ventricle has normal function. The left ventricle has no regional wall motion abnormalities. Left ventricular diastolic function could not be evaluated. 2. Right ventricular systolic function is normal. The right ventricular size is normal. 3.  The mitral valve is normal in structure. Mild mitral valve regurgitation. No evidence of mitral stenosis. 4. The aortic valve is grossly normal. There is mild calcification of the aortic valve. Aortic valve regurgitation is not visualized.  Comparison(s): Prior images unable to be directly viewed, comparison made by report only.  Conclusion(s)/Recommendation(s): Normal echo. Unable to obtain IV access for bubble study; if IV placed and bubble study still required, please place order for limited echo with bubble.  FINDINGS Left Ventricle: Left ventricular ejection fraction, by estimation, is 55 to 60%. The left ventricle has normal function. The left ventricle has no regional wall motion abnormalities. The left ventricular internal cavity size was normal in size. There is borderline left ventricular hypertrophy. Left ventricular diastolic function could not be evaluated due to atrial fibrillation. Left ventricular diastolic function could not be evaluated.  Right Ventricle: The right ventricular size is normal. Right vetricular wall thickness was not well visualized. Right ventricular systolic function is normal.  Left Atrium: Left atrial size was normal in size.  Right Atrium:  Right atrial size was normal in size.  Pericardium: There is no evidence of pericardial effusion.  Mitral Valve: The mitral valve is normal in structure. Mild mitral annular calcification. Mild mitral valve regurgitation. No evidence of mitral valve stenosis.  Tricuspid Valve: The tricuspid valve is normal in structure. Tricuspid valve regurgitation is trivial. No evidence of tricuspid stenosis.  Aortic Valve: The aortic valve is grossly normal. There is mild calcification of the aortic valve. Aortic valve regurgitation is not visualized. Aortic valve mean gradient measures 3.0 mmHg. Aortic valve peak gradient measures 4.9 mmHg. Aortic valve area, by VTI measures 1.83 cm.  Pulmonic Valve: The pulmonic valve was not well visualized. Pulmonic valve regurgitation is trivial. No evidence of pulmonic stenosis.  Aorta: The aortic root and ascending aorta are structurally normal, with no evidence of dilitation.  IAS/Shunts: The atrial septum is grossly normal.   LEFT VENTRICLE PLAX 2D LVIDd:         3.10 cm LVIDs:         2.80 cm LV PW:         1.10 cm LV IVS:        1.10 cm LVOT diam:     2.00 cm LV SV:         38 LV SV Index:   21 LVOT Area:     3.14 cm  LV Volumes (MOD) LV vol d, MOD A2C: 50.5 ml LV vol d, MOD A4C: 53.9 ml LV vol s, MOD A2C: 21.2 ml LV vol s, MOD A4C: 18.4 ml LV SV MOD A2C:     29.3 ml LV SV MOD A4C:     53.9 ml LV SV MOD BP:      33.8 ml  RIGHT VENTRICLE RV Basal diam:  3.20 cm RV Mid diam:    2.60 cm TAPSE (M-mode): 1.7 cm  LEFT ATRIUM             Index        RIGHT ATRIUM           Index LA diam:        3.20 cm 1.77 cm/m   RA Area:     14.70 cm LA Vol (A2C):   41.4 ml 22.92 ml/m  RA Volume:   30.00 ml  16.61 ml/m LA Vol (A4C):   45.0 ml 24.91 ml/m LA Biplane Vol: 44.7 ml 24.75 ml/m AORTIC VALVE AV Area (Vmax):  2.12 cm AV Area (Vmean):   2.09 cm AV Area (VTI):     1.83 cm AV Vmax:           111.00 cm/s AV Vmean:          75.600  cm/s AV VTI:            0.206 m AV Peak Grad:      4.9 mmHg AV Mean Grad:      3.0 mmHg LVOT Vmax:         74.90 cm/s LVOT Vmean:        50.300 cm/s LVOT VTI:          0.120 m LVOT/AV VTI ratio: 0.58  AORTA Ao Root diam: 3.10 cm Ao Asc diam:  2.90 cm   SHUNTS Systemic VTI:  0.12 m Systemic Diam: 2.00 cm  Sheryle Donning MD Electronically signed by Sheryle Donning MD Signature Date/Time: 12/22/2022/1:04:18 PM    Final          ______________________________________________________________________________________________      Risk Assessment/Calculations  CHA2DS2-VASc Score = 7   This indicates a 11.2% annual risk of stroke. The patient's score is based upon: CHF History: 0 HTN History: 1 Diabetes History: 1 Stroke History: 2 Vascular Disease History: 1 Age Score: 2 Gender Score: 0         Physical Exam VS:  BP 130/70   Pulse 98   Ht 5\' 6"  (1.676 m)   Wt 148 lb (67.1 kg)   SpO2 93%   BMI 23.89 kg/m    Wt Readings from Last 3 Encounters:  01/08/24 148 lb (67.1 kg)  06/20/23 146 lb 6.2 oz (66.4 kg)  12/21/22 157 lb 6.5 oz (71.4 kg)    GEN: Well nourished, well developed in no acute distress NECK: No JVD; No carotid bruits CARDIAC: irregularly irregular, no murmurs, rubs, gallops RESPIRATORY:  Clear to auscultation without rales, wheezing or rhonchi  ABDOMEN: Soft, non-tender, non-distended EXTREMITIES:  No edema; No deformity   ASSESSMENT AND PLAN Syncope and collapse-most likely a component of orthostatic hypotension secondary to his Parkinson's disease.  He is staying well-hydrated.  Will decrease his Lasix  to half a tablet daily.  CAD - s/p LHC in 2023 revealing Mid LM to Dist LM lesion is 40% stenosed with 70% stenosed side branch in Ost Cx to Prox Cx. Stable with no anginal symptoms. No indication for ischemic evaluation.  Currently on Eliquis  therefore he is not on aspirin , will start him on metoprolol  per below, continue Crestor  20  mg in the evening, nitroglycerin  as needed.  HFmrEF-NYHA class I-II, appears to be euvolemic, not be a good candidate for SGLT2 inhibitor, not sure his blood pressure can tolerate much adjustment with his medications.  Will stop his cardia XT and start him on metoprolol  succinate 25 mg daily.  Will decrease his Lasix  to half a tablet daily.  Permanent atrial fibrillation/hypercoagulable state - CV score of 7, currently on Eliquis  5 mg twice daily -- labs from PCP in April 2025 reveal HGB 13.5 and HCT 31.9, Creatinine 1.34 and he is currently on the correct dose of Eliquis .  Will discontinue his Cartia  and start him on metoprolol  succinate 25 mg in the evening to help with his heart failure.  Hypertension -blood pressure is controlled 130/70, likely need to allow for permissive hypertension due to recent syncopal event.  Dyslipidemia - most recent LDL was well controlled at 35 in February 2025.   Erectile dysfunction-was advised by his PCP  to get cleared through cardiology for PDE 5 inhibitor, from an ischemic perspective I think it would be okay for him to participate in sexual relations with his wife however I am concerned about his recent syncopal event therefore, we will refer him to urology for further recommendations.       Dispo: Decrease lasix  to half tablet daily, d/c Cartia  XT, start metoprolol  tartrate 25 mg with dinner.   Signed, Terrance Ferretti, NP

## 2024-01-08 ENCOUNTER — Encounter: Payer: Self-pay | Admitting: Cardiology

## 2024-01-08 ENCOUNTER — Ambulatory Visit: Attending: Cardiology | Admitting: Cardiology

## 2024-01-08 VITALS — BP 130/70 | HR 98 | Ht 66.0 in | Wt 148.0 lb

## 2024-01-08 DIAGNOSIS — N521 Erectile dysfunction due to diseases classified elsewhere: Secondary | ICD-10-CM

## 2024-01-08 DIAGNOSIS — I5022 Chronic systolic (congestive) heart failure: Secondary | ICD-10-CM

## 2024-01-08 DIAGNOSIS — D6859 Other primary thrombophilia: Secondary | ICD-10-CM | POA: Diagnosis not present

## 2024-01-08 DIAGNOSIS — I5020 Unspecified systolic (congestive) heart failure: Secondary | ICD-10-CM

## 2024-01-08 DIAGNOSIS — I4821 Permanent atrial fibrillation: Secondary | ICD-10-CM | POA: Diagnosis not present

## 2024-01-08 DIAGNOSIS — I10 Essential (primary) hypertension: Secondary | ICD-10-CM

## 2024-01-08 DIAGNOSIS — I2510 Atherosclerotic heart disease of native coronary artery without angina pectoris: Secondary | ICD-10-CM | POA: Diagnosis not present

## 2024-01-08 MED ORDER — METOPROLOL SUCCINATE ER 25 MG PO TB24: 25.0000 mg | ORAL_TABLET | Freq: Every day | ORAL | 3 refills | Status: AC

## 2024-01-08 MED ORDER — FUROSEMIDE 20 MG PO TABS: 10.0000 mg | ORAL_TABLET | Freq: Every day | ORAL | 3 refills | Status: DC

## 2024-01-08 NOTE — Patient Instructions (Signed)
 Medication Instructions:  Your physician has recommended you make the following change in your medication:  Decrease Lasix  to 1/2 tablet once daily Stop Diltiazem  Start Metoprolol Succinate 25 mg daily  *If you need a refill on your cardiac medications before your next appointment, please call your pharmacy*  Lab Work: NONE If you have labs (blood work) drawn today and your tests are completely normal, you will receive your results only by: MyChart Message (if you have MyChart) OR A paper copy in the mail If you have any lab test that is abnormal or we need to change your treatment, we will call you to review the results.  Testing/Procedures: NONE  Follow-Up: At [facility], you and your health needs are our priority.  As part of our continuing mission to provide you with exceptional heart care, our providers are all part of one team.  This team includes your primary Cardiologist (physician) and Advanced Practice Providers or APPs (Physician Assistants and Nurse Practitioners) who all work together to provide you with the care you need, when you need it.  Your next appointment:   6 week(s)  Provider:   Hillis Lu, MD or Pattricia Bores, NP   We recommend signing up for the patient portal called "MyChart".  Sign up information is provided on this After Visit Summary.  MyChart is used to connect with patients for Virtual Visits (Telemedicine).  Patients are able to view lab/test results, encounter notes, upcoming appointments, etc.  Non-urgent messages can be sent to your provider as well.   To learn more about what you can do with MyChart, go to [email or web address].   Other Instructions

## 2024-01-19 ENCOUNTER — Telehealth: Payer: Self-pay

## 2024-01-19 NOTE — Telephone Encounter (Signed)
 Attempt to call the patient. No answer or VM to leave a message.

## 2024-01-19 NOTE — Telephone Encounter (Signed)
-----   Message from Terrance Ferretti sent at 01/18/2024  4:24 PM EDT ----- Micheal [name] we call and see how he is feeling after starting metoprolol  and stopping his cardizem  please? Trying to see if it helped with his heart failure symptoms. ----- Message ----- From: Terrance Ferretti, NP Sent: 01/15/2024  12:00 AM EDT To: Terrance Ferretti, NP  Call and see how he is feeling on beta blocker after stopping cardizem

## 2024-01-20 ENCOUNTER — Telehealth: Payer: Self-pay

## 2024-01-20 NOTE — Telephone Encounter (Signed)
 Called the patient and informed him of Davie Essex message below:  Can we call and see how he is feeling after starting metoprolol  and stopping his cardizem  please?  Trying to see if it helped with his heart failure symptoms.  Patient stated that he was not sure if the stopping Cardizem  and starting the Metoprolol  had actually helped yet because it had just changed a few days ago. Patient also asked me to contact his wife regarding any medication questions.

## 2024-01-20 NOTE — Telephone Encounter (Signed)
Full voice mail.

## 2024-01-20 NOTE — Telephone Encounter (Signed)
-----   Message from Terrance Ferretti sent at 01/18/2024  4:24 PM EDT ----- Curlene Dotter we call and see how he is feeling after starting metoprolol  and stopping his cardizem  please? Trying to see if it helped with his heart failure symptoms. ----- Message ----- From: Terrance Ferretti, NP Sent: 01/15/2024  12:00 AM EDT To: Terrance Ferretti, NP  Call and see how he is feeling on beta blocker after stopping cardizem

## 2024-01-25 NOTE — Telephone Encounter (Signed)
 Attempted to call the patient. Patient did not answer the phone and the voice mail was full so unable to leave a message.

## 2024-01-26 NOTE — Telephone Encounter (Signed)
Full vm 

## 2024-02-17 NOTE — Progress Notes (Unsigned)
 Cardiology Office Note   Date:  02/17/2024  ID:  Micheal [name]., DOB [birth year], MRN [id number] PCP: Jama Chow, MD  [facility] Providers Cardiologist:  Oday Ridings [name], MD     History of Present Illness Micheal [name]. is a 83 y.o. male  with a past medical history of CAD, hypertension, permanent atrial fibrillation on Elqius, DM2, Parkinson's disease, CKD 3b, BPH, history of stroke, cerebral aneurysm s/p clipping, dyslipidemia.  01/05/2024 echo EF 45 to 50%, mild to moderate MR 12/22/2022 echo EF 55-60%, mild MR, mild calcification of the aortic valve 01/15/2022 cardiac cath Mid LM to Dist LM lesion is 40% stenosed with 70% stenosed side branch in Ost Cx to Prox Cx, not favorable for PCI, medical therapy 12/04/2021 Lexiscan  abnormal   He established care with Dr. Crape in 2023 after an abnormal stress test. He subsequently underwent a cardiac cath in June 2023 revealing Mid LM to Dist LM lesion is 40% stenosed with 70% stenosed side branch in Ost Cx to Prox Cx, but not amenable for PCI and recommendations for medial therapy. Most recently he was evaluated by our office on 10/2022, was stable from a cardiac perspective, no changes to plan of care and advised to follow up in 6 months.   He presents today accompanied by his wife for follow-up of his CAD and atrial fibrillation.  He mentions he was recently admitted to [facility] for a near syncopal episode, He had apparently been sitting in his wheelchair and passed out striking his head, upon arrival in the ED a code stroke was called, CT was negative, not able to undergo MRI secondary to prior aneurysmal clipping and subsequently was advised there had not been a stroke.  He had an echocardiogram revealing EF of 45 to 50% with mild to moderate mitral regurgitation he received gentle IV fluids and was subsequently discharged home with home health PT and OT services.  Today, he is feeling okay, not quite back to his baseline.   He is trying to stay well-hydrated.  We discussed the slightly diminished EF, he is overall asymptomatic from that perspective.  He has seen his PCP since his ED visit and repeat labs were obtained. He denies chest pain, palpitations, dyspnea, pnd, orthopnea, n, v, edema, weight gain, or early satiety.    ROS: Review of Systems  Constitutional:  Positive for malaise/fatigue.  Neurological:  Positive for tremors.     Studies Reviewed      Cardiac Studies & Procedures   ______________________________________________________________________________________________ CARDIAC CATHETERIZATION  CARDIAC CATHETERIZATION 01/15/2022  Conclusion   Mid LM to Dist LM lesion is 40% stenosed with 70% stenosed side branch in Ost Cx to Prox Cx. - NOT FAVORABLE FOR PCI - no safe landing zone for stent   Ost LAD to Mid LAD lesion is 10% stenosed.   LV end diastolic pressure is mildly elevated.   There is no aortic valve stenosis.    PLAN OF CARE: Discharge to home after PACU The culprit LCx lesion is not favorable for PCI, but he is not having symptoms. The patient is a 83 year old gentleman who is preop for shoulder surgery -he is able to ride a stationary bicycle for 30 minutes without angina -- I would not recommend further treatment other than medical management. would recommend proceeding to surgery with no further cardiac evaluation other than potentially considering beta-blocker if there is time to initiate prior to surgery.  Findings Coronary Findings Diagnostic  Dominance: Co-dominant  Left  Main Vessel was injected. Vessel is large. There is moderate focal disease in the vessel. Mid LM to Dist LM lesion is 40% stenosed with 70% stenosed side branch in Ost Cx to Prox Cx. The lesion is located at the bifurcation, focal and smooth. The ostial LCx lesion-would explain the lateral ischemia on Myoview .  Not favorable for PCI -> would not be able to land a stent and not jeopardize the LAD.  With  existing LM disease, would not recommend PCI.  Left Anterior Descending Vessel is large. Ost LAD to Mid LAD lesion is 10% stenosed.  First Diagonal Branch Vessel is small in size.  Second Diagonal Branch Vessel is small in size.  Left Circumflex Vessel is large.  First Obtuse Marginal Branch Vessel is small in size.  Second Obtuse Marginal Branch Vessel is small in size.  Third Obtuse Marginal Branch Vessel is large in size.  First Left Posterolateral Branch Vessel is small in size.  Left Posterior Atrioventricular Artery Vessel is large in size.  Right Coronary Artery Vessel was injected. Vessel is large. The vessel exhibits minimal luminal irregularities.  Right Ventricular Branch Vessel is small in size.  Right Posterior Descending Artery Vessel is moderate in size.  Right Posterior Atrioventricular Artery Vessel is small in size.  First Right Posterolateral Branch Vessel is small in size.  Intervention  No interventions have been documented.     ECHOCARDIOGRAM  ECHOCARDIOGRAM COMPLETE 12/22/2022  Narrative ECHOCARDIOGRAM REPORT    Patient Name:   Micheal [name]. Date of Exam: 12/22/2022 Medical Rec #:  [id number]            Height:       66.0 in Accession #:    [id number]           Weight:       157.4 lb Date of Birth:  10-Jul-1941           BSA:          1.806 m Patient Age:    24 years             BP:           110/82 mmHg Patient Gender: M                    HR:           102 bpm. Exam Location:  Inpatient  Procedure: 2D Echo, Cardiac Doppler and Color Doppler  Indications:    Stroke  History:        Patient has prior history of Echocardiogram examinations, most recent 05/20/2021. CAD, Stroke; Risk Factors:Diabetes, Dyslipidemia and Hypertension.  Sonographer:    Eleanor Lincoln Referring Phys: [id number] JULIE HAVILAND   Sonographer Comments: Patient sent from ED without an IV. Bubble study could not be completed. IMPRESSIONS   1.  Left ventricular ejection fraction, by estimation, is 55 to 60%. The left ventricle has normal function. The left ventricle has no regional wall motion abnormalities. Left ventricular diastolic function could not be evaluated. 2. Right ventricular systolic function is normal. The right ventricular size is normal. 3. The mitral valve is normal in structure. Mild mitral valve regurgitation. No evidence of mitral stenosis. 4. The aortic valve is grossly normal. There is mild calcification of the aortic valve. Aortic valve regurgitation is not visualized.  Comparison(s): Prior images unable to be directly viewed, comparison made by report only.  Conclusion(s)/Recommendation(s): Normal echo. Unable to obtain IV access for bubble study; if IV  placed and bubble study still required, please place order for limited echo with bubble.  FINDINGS Left Ventricle: Left ventricular ejection fraction, by estimation, is 55 to 60%. The left ventricle has normal function. The left ventricle has no regional wall motion abnormalities. The left ventricular internal cavity size was normal in size. There is borderline left ventricular hypertrophy. Left ventricular diastolic function could not be evaluated due to atrial fibrillation. Left ventricular diastolic function could not be evaluated.  Right Ventricle: The right ventricular size is normal. Right vetricular wall thickness was not well visualized. Right ventricular systolic function is normal.  Left Atrium: Left atrial size was normal in size.  Right Atrium: Right atrial size was normal in size.  Pericardium: There is no evidence of pericardial effusion.  Mitral Valve: The mitral valve is normal in structure. Mild mitral annular calcification. Mild mitral valve regurgitation. No evidence of mitral valve stenosis.  Tricuspid Valve: The tricuspid valve is normal in structure. Tricuspid valve regurgitation is trivial. No evidence of tricuspid stenosis.  Aortic  Valve: The aortic valve is grossly normal. There is mild calcification of the aortic valve. Aortic valve regurgitation is not visualized. Aortic valve mean gradient measures 3.0 mmHg. Aortic valve peak gradient measures 4.9 mmHg. Aortic valve area, by VTI measures 1.83 cm.  Pulmonic Valve: The pulmonic valve was not well visualized. Pulmonic valve regurgitation is trivial. No evidence of pulmonic stenosis.  Aorta: The aortic root and ascending aorta are structurally normal, with no evidence of dilitation.  IAS/Shunts: The atrial septum is grossly normal.   LEFT VENTRICLE PLAX 2D LVIDd:         3.10 cm LVIDs:         2.80 cm LV PW:         1.10 cm LV IVS:        1.10 cm LVOT diam:     2.00 cm LV SV:         38 LV SV Index:   21 LVOT Area:     3.14 cm  LV Volumes (MOD) LV vol d, MOD A2C: 50.5 ml LV vol d, MOD A4C: 53.9 ml LV vol s, MOD A2C: 21.2 ml LV vol s, MOD A4C: 18.4 ml LV SV MOD A2C:     29.3 ml LV SV MOD A4C:     53.9 ml LV SV MOD BP:      33.8 ml  RIGHT VENTRICLE RV Basal diam:  3.20 cm RV Mid diam:    2.60 cm TAPSE (M-mode): 1.7 cm  LEFT ATRIUM             Index        RIGHT ATRIUM           Index LA diam:        3.20 cm 1.77 cm/m   RA Area:     14.70 cm LA Vol (A2C):   41.4 ml 22.92 ml/m  RA Volume:   30.00 ml  16.61 ml/m LA Vol (A4C):   45.0 ml 24.91 ml/m LA Biplane Vol: 44.7 ml 24.75 ml/m AORTIC VALVE AV Area (Vmax):    2.12 cm AV Area (Vmean):   2.09 cm AV Area (VTI):     1.83 cm AV Vmax:           111.00 cm/s AV Vmean:          75.600 cm/s AV VTI:            0.206 m AV Peak Grad:  4.9 mmHg AV Mean Grad:      3.0 mmHg LVOT Vmax:         74.90 cm/s LVOT Vmean:        50.300 cm/s LVOT VTI:          0.120 m LVOT/AV VTI ratio: 0.58  AORTA Ao Root diam: 3.10 cm Ao Asc diam:  2.90 cm   SHUNTS Systemic VTI:  0.12 m Systemic Diam: 2.00 cm  Shelda Bruckner MD Electronically signed by Shelda Bruckner MD Signature Date/Time:  12/22/2022/1:04:18 PM    Final          ______________________________________________________________________________________________      Risk Assessment/Calculations  CHA2DS2-VASc Score = 7   This indicates a 11.2% annual risk of stroke. The patient's score is based upon: CHF History: 0 HTN History: 1 Diabetes History: 1 Stroke History: 2 Vascular Disease History: 1 Age Score: 2 Gender Score: 0   {This patient has a significant risk of stroke if diagnosed with atrial fibrillation.  Please consider VKA or DOAC agent for anticoagulation if the bleeding risk is acceptable.   You can also use the SmartPhrase .[email or web address] for documentation.   :[id number]}      Physical Exam VS:  There were no vitals taken for this visit.   Wt Readings from Last 3 Encounters:  01/08/24 148 lb (67.1 kg)  06/20/23 146 lb 6.2 oz (66.4 kg)  12/21/22 157 lb 6.5 oz (71.4 kg)    GEN: Well nourished, well developed in no acute distress NECK: No JVD; No carotid bruits CARDIAC: irregularly irregular, no murmurs, rubs, gallops RESPIRATORY:  Clear to auscultation without rales, wheezing or rhonchi  ABDOMEN: Soft, non-tender, non-distended EXTREMITIES:  No edema; No deformity   ASSESSMENT AND PLAN Syncope and collapse-most likely a component of orthostatic hypotension secondary to his Parkinson's disease.  He is staying well-hydrated.  Will decrease his Lasix  to half a tablet daily.  CAD - s/p LHC in 2023 revealing Mid LM to Dist LM lesion is 40% stenosed with 70% stenosed side branch in Ost Cx to Prox Cx. Stable with no anginal symptoms. No indication for ischemic evaluation.  Currently on Eliquis  therefore he is not on aspirin , will start him on metoprolol  per below, continue Crestor  20 mg in the evening, nitroglycerin  as needed.  HFmrEF-NYHA class I-II, appears to be euvolemic, not be a good candidate for SGLT2 inhibitor, not sure his blood pressure can tolerate much adjustment with his  medications.  Will stop his cardia XT and start him on metoprolol  succinate 25 mg daily.  Will decrease his Lasix  to half a tablet daily.  Permanent atrial fibrillation/hypercoagulable state - CV score of 7, currently on Eliquis  5 mg twice daily -- labs from PCP in April 2025 reveal HGB 13.5 and HCT 31.9, Creatinine 1.34 and he is currently on the correct dose of Eliquis .  Will discontinue his Cartia  and start him on metoprolol  succinate 25 mg in the evening to help with his heart failure.  Hypertension -blood pressure is controlled 130/70, likely need to allow for permissive hypertension due to recent syncopal event.  Dyslipidemia - most recent LDL was well controlled at 35 in February 2025.   Erectile dysfunction-was advised by his PCP to get cleared through cardiology for PDE 5 inhibitor, from an ischemic perspective I think it would be okay for him to participate in sexual relations with his wife however I am concerned about his recent syncopal event therefore, we will refer him to urology for further recommendations.  Dispo: Decrease lasix  to half tablet daily, d/c Cartia  XT, start metoprolol  tartrate 25 mg with dinner.   Signed, Delon JAYSON Hoover, NP

## 2024-02-19 ENCOUNTER — Telehealth: Payer: Self-pay | Admitting: Cardiology

## 2024-02-19 ENCOUNTER — Ambulatory Visit

## 2024-02-19 ENCOUNTER — Ambulatory Visit: Attending: Cardiology | Admitting: Cardiology

## 2024-02-19 ENCOUNTER — Encounter: Payer: Self-pay | Admitting: Cardiology

## 2024-02-19 VITALS — BP 120/80 | HR 96 | Ht 66.0 in | Wt 150.0 lb

## 2024-02-19 DIAGNOSIS — R55 Syncope and collapse: Secondary | ICD-10-CM | POA: Diagnosis not present

## 2024-02-19 DIAGNOSIS — I4821 Permanent atrial fibrillation: Secondary | ICD-10-CM

## 2024-02-19 DIAGNOSIS — I5022 Chronic systolic (congestive) heart failure: Secondary | ICD-10-CM

## 2024-02-19 DIAGNOSIS — I2510 Atherosclerotic heart disease of native coronary artery without angina pectoris: Secondary | ICD-10-CM

## 2024-02-19 DIAGNOSIS — D6859 Other primary thrombophilia: Secondary | ICD-10-CM | POA: Diagnosis not present

## 2024-02-19 DIAGNOSIS — I5020 Unspecified systolic (congestive) heart failure: Secondary | ICD-10-CM

## 2024-02-19 MED ORDER — FUROSEMIDE 20 MG PO TABS: 20.0000 mg | ORAL_TABLET | ORAL | Status: AC

## 2024-02-19 NOTE — Telephone Encounter (Signed)
 [employer] reporting Afib 82-108 bpm- avg 92 bpm lasting 90sec today at 2:17pm

## 2024-02-19 NOTE — Patient Instructions (Signed)
 Medication Instructions:   Change: Furosemide  (lasix ) to one full tab EVERY OTHER DAY and half tablet in between.  Please take your Flomax  at BEDTIME  Lab Work: None  Testing/Procedures:  ZIO AT Long term monitor-Live Telemetry  Your physician has requested you wear a ZIO patch monitor for 14 days.  This is a single patch monitor. [employer] supplies one patch monitor per enrollment. Additional  stickers are not available.  Please do not apply patch if you will be having a Nuclear Stress Test, Echocardiogram, Cardiac CT, MRI,  or Chest Xray during the period you would be wearing the monitor. The patch cannot be worn during  these tests. You cannot remove and re-apply the ZIO AT patch monitor.  Your ZIO patch monitor will be mailed 3 day USPS to your address on file. It may take 3-5 days to  receive your monitor after you have been enrolled.  Once you have received your monitor, please review the enclosed instructions. Your monitor has  already been registered assigning a specific monitor serial # to you.   Billing and Patient Assistance Program information  Meredeth has been supplied with any insurance information on record for billing. [employer] offers a sliding scale Patient Assistance Program for patients without insurance, or whose  insurance does not completely cover the cost of the ZIO patch monitor. You must apply for the  Patient Assistance Program to qualify for the discounted rate. To apply, call [employer] at [phone],  select option 4, select option 2 , ask to apply for the Patient Assistance Program, (you can request an  interpreter if needed). [employer] will ask your household income and how many people are in your  household. [employer] will quote your out-of-pocket cost based on this information. They will also be able  to set up a 12 month interest free payment plan if needed.  Applying the monitor   Shave hair from upper left chest.  Hold the abrader disc by orange tab.  Rub the abrader in 40 strokes over left upper chest as indicated in  your monitor instructions.  Clean area with 4 enclosed alcohol pads. Use all pads to ensure the area is cleaned thoroughly. Let  dry.  Apply patch as indicated in monitor instructions. Patch will be placed under collarbone on left side of  chest with arrow pointing upward.  Rub patch adhesive wings for 2 minutes. Remove the white label marked 1. Remove the white label  marked 2. Rub patch adhesive wings for 2 additional minutes.  While looking in a mirror, press and release button in center of patch. A small green light will flash 3-4  times. This will be your only indicator that the monitor has been turned on.  Do not shower for the first 24 hours. You may shower after the first 24 hours.  Press the button if you feel a symptom. You will hear a small click. Record Date, Time and Symptom in  the Patient Log.   Starting the Gateway  In your kit there is a Audiological scientist box the size of a cellphone. This is Buyer, retail. It transmits all your  recorded data to [facility]. This box must always stay within 10 feet of you. Open the box and push the *  button. There will be a light that blinks orange and then green a few times. When the light stops  blinking, the Gateway is connected to the ZIO patch. Call [employer] at [phone] to confirm your monitor is transmitting.  Returning  your monitor  Remove your patch and place it inside the Gateway. In the lower half of the Gateway there is a white  bag with prepaid postage on it. Place Gateway in bag and seal. Mail package back to [place] as soon as  possible. Your physician should have your final report approximately 7 days after you have mailed back  your monitor. Call [facility] Customer Care at [phone] if you have questions regarding your ZIO AT  patch monitor. Call them immediately if you see an orange light blinking on your monitor.  If your monitor falls  off in less than 4 days, contact our Monitor department at [phone]. If your  monitor becomes loose or falls off after 4 days call [employer] at [phone] for suggestions on  securing your monitor   Follow-Up: At [facility], you and your health needs are our priority.  As part of our continuing mission to provide you with exceptional heart care, our providers are all part of one team.  This team includes your primary Cardiologist (physician) and Advanced Practice Providers or APPs (Physician Assistants and Nurse Practitioners) who all work together to provide you with the care you need, when you need it.  Your next appointment:   6 week(s)  Provider:   Delon Hoover, NP [place])   We recommend signing up for the patient portal called MyChart.  Sign up information is provided on this After Visit Summary.  MyChart is used to connect with patients for Virtual Visits (Telemedicine).  Patients are able to view lab/test results, encounter notes, upcoming appointments, etc.  Non-urgent messages can be sent to your provider as well.   To learn more about what you can do with MyChart, go to [email or web address].   Other Instructions Please wear compression socks during the day time; you can purchase these on [employer].

## 2024-02-19 NOTE — Telephone Encounter (Signed)
 Abnormal results

## 2024-02-19 NOTE — Telephone Encounter (Signed)
 Reported to Dr. Krasowski and Delon Hoover, NP. Pt has known A-Fib and is anticoagulated.

## 2024-03-23 ENCOUNTER — Ambulatory Visit: Payer: Self-pay | Admitting: Cardiology

## 2024-03-23 DIAGNOSIS — R55 Syncope and collapse: Secondary | ICD-10-CM

## 2024-03-28 ENCOUNTER — Telehealth: Payer: Self-pay

## 2024-03-28 NOTE — Telephone Encounter (Signed)
 Monitor Results reviewed with pt's wife Ellouise as per Dr. Karry note.  Spouse verbalized understanding and had no additional questions. Routed to PCP

## 2024-04-05 ENCOUNTER — Ambulatory Visit: Admitting: Cardiology

## 2024-04-28 NOTE — Progress Notes (Signed)
 " Cardiology Office Note   Date:  04/28/2024  ID:  Micheal Hynek., DOB 12/28/1940, MRN [id number] PCP: Micheal Chow, MD  [facility] Providers Cardiologist:  Arav Bannister [name], MD     History of Present Illness Micheal Sedlacek. is a 83 y.o. male  with a past medical history of CAD, hypertension, permanent atrial fibrillation on Elqius, DM2, Parkinson's disease, CKD 3b, BPH, history of stroke, cerebral aneurysm s/p clipping, dyslipidemia.  02/20/2024 live monitor A-fib burden 100%, rate controlled, 1 run of VT lasting 4 beats, no pauses 01/05/2024 echo EF 45 to 50%, mild to moderate MR 12/22/2022 echo EF 55-60%, mild MR, mild calcification of the aortic valve 01/15/2022 cardiac cath Mid LM to Dist LM lesion is 40% stenosed with 70% stenosed side branch in Ost Cx to Prox Cx, not favorable for PCI, medical therapy 12/04/2021 Lexiscan  abnormal   He established care with Dr. Crape in 2023 after an abnormal stress test. He subsequently underwent a cardiac cath in June 2023 revealing Mid LM to Dist LM lesion is 40% stenosed with 70% stenosed side branch in Ost Cx to Prox Cx, but not amenable for PCI and recommendations for medial therapy.  Most recently evaluated by myself on 01/08/2024 for follow-up of his CAD and atrial fibrillation, he had been admitted to [facility] for near syncopal event, workup was unrevealing, he did have an echocardiogram at that time revealing an EF of 45 to 50%, he was feeling okay at this time, we discontinued his cardia as well as his Lasix  and started him on metoprolol .  Most recently evaluated by myself on 02/19/2024, this was following a syncopal episode, he was myself when syncope happened and was not aware of any prodromal symptoms.  He was transferred to the emergency department was noted to be in A-fib with RVR.  Will arrange a live monitor to evaluate for any pauses, revealed chronic atrial fibrillation with controlled rates.  He presents today  accompanied by his wife, has been doing relatively well since he was last evaluated in our office, no further syncopal episodes, he is not been is bothered by dizziness. He denies chest pain, palpitations, dyspnea, pnd, orthopnea, n, v, dizziness, syncope, edema, weight gain, or early satiety.   ROS: Review of Systems  Constitutional:  Positive for malaise/fatigue.  Neurological:  Positive for tremors.     Studies Reviewed      Cardiac Studies & Procedures   ______________________________________________________________________________________________ CARDIAC CATHETERIZATION  CARDIAC CATHETERIZATION 01/15/2022  Conclusion   Mid LM to Dist LM lesion is 40% stenosed with 70% stenosed side branch in Ost Cx to Prox Cx. - NOT FAVORABLE FOR PCI - no safe landing zone for stent   Ost LAD to Mid LAD lesion is 10% stenosed.   LV end diastolic pressure is mildly elevated.   There is no aortic valve stenosis.    PLAN OF CARE: Discharge to home after PACU The culprit LCx lesion is not favorable for PCI, but he is not having symptoms. The patient is a 83 year old gentleman who is preop for shoulder surgery -he is able to ride a stationary bicycle for 30 minutes without angina -- I would not recommend further treatment other than medical management. would recommend proceeding to surgery with no further cardiac evaluation other than potentially considering beta-blocker if there is time to initiate prior to surgery.  Findings Coronary Findings Diagnostic  Dominance: Co-dominant  Left Main Vessel was injected. Vessel is large. There is moderate focal  disease in the vessel. Mid LM to Dist LM lesion is 40% stenosed with 70% stenosed side branch in Ost Cx to Prox Cx. The lesion is located at the bifurcation, focal and smooth. The ostial LCx lesion-would explain the lateral ischemia on Myoview .  Not favorable for PCI -> would not be able to land a stent and not jeopardize the LAD.  With existing LM  disease, would not recommend PCI.  Left Anterior Descending Vessel is large. Ost LAD to Mid LAD lesion is 10% stenosed.  First Diagonal Branch Vessel is small in size.  Second Diagonal Branch Vessel is small in size.  Left Circumflex Vessel is large.  First Obtuse Marginal Branch Vessel is small in size.  Second Obtuse Marginal Branch Vessel is small in size.  Third Obtuse Marginal Branch Vessel is large in size.  First Left Posterolateral Branch Vessel is small in size.  Left Posterior Atrioventricular Artery Vessel is large in size.  Right Coronary Artery Vessel was injected. Vessel is large. The vessel exhibits minimal luminal irregularities.  Right Ventricular Branch Vessel is small in size.  Right Posterior Descending Artery Vessel is moderate in size.  Right Posterior Atrioventricular Artery Vessel is small in size.  First Right Posterolateral Branch Vessel is small in size.  Intervention  No interventions have been documented.     ECHOCARDIOGRAM  ECHOCARDIOGRAM COMPLETE 12/22/2022  Narrative ECHOCARDIOGRAM REPORT    Patient Name:   Micheal Hynes. Date of Exam: 12/22/2022 Medical Rec #:  [id number]            Height:       66.0 in Accession #:    [id number]           Weight:       157.4 lb Date of Birth:  1940/08/12           BSA:          1.806 m Patient Age:    53 years             BP:           110/82 mmHg Patient Gender: M                    HR:           102 bpm. Exam Location:  Inpatient  Procedure: 2D Echo, Cardiac Doppler and Color Doppler  Indications:    Stroke  History:        Patient has prior history of Echocardiogram examinations, most recent 05/20/2021. CAD, Stroke; Risk Factors:Diabetes, Dyslipidemia and Hypertension.  Sonographer:    Eleanor Lincoln Referring Phys: [phone] JULIE HAVILAND   Sonographer Comments: Patient sent from ED without an IV. Bubble study could not be completed. IMPRESSIONS   1. Left  ventricular ejection fraction, by estimation, is 55 to 60%. The left ventricle has normal function. The left ventricle has no regional wall motion abnormalities. Left ventricular diastolic function could not be evaluated. 2. Right ventricular systolic function is normal. The right ventricular size is normal. 3. The mitral valve is normal in structure. Mild mitral valve regurgitation. No evidence of mitral stenosis. 4. The aortic valve is grossly normal. There is mild calcification of the aortic valve. Aortic valve regurgitation is not visualized.  Comparison(s): Prior images unable to be directly viewed, comparison made by report only.  Conclusion(s)/Recommendation(s): Normal echo. Unable to obtain IV access for bubble study; if IV placed and bubble study still required, please place order for limited  echo with bubble.  FINDINGS Left Ventricle: Left ventricular ejection fraction, by estimation, is 55 to 60%. The left ventricle has normal function. The left ventricle has no regional wall motion abnormalities. The left ventricular internal cavity size was normal in size. There is borderline left ventricular hypertrophy. Left ventricular diastolic function could not be evaluated due to atrial fibrillation. Left ventricular diastolic function could not be evaluated.  Right Ventricle: The right ventricular size is normal. Right vetricular wall thickness was not well visualized. Right ventricular systolic function is normal.  Left Atrium: Left atrial size was normal in size.  Right Atrium: Right atrial size was normal in size.  Pericardium: There is no evidence of pericardial effusion.  Mitral Valve: The mitral valve is normal in structure. Mild mitral annular calcification. Mild mitral valve regurgitation. No evidence of mitral valve stenosis.  Tricuspid Valve: The tricuspid valve is normal in structure. Tricuspid valve regurgitation is trivial. No evidence of tricuspid stenosis.  Aortic Valve:  The aortic valve is grossly normal. There is mild calcification of the aortic valve. Aortic valve regurgitation is not visualized. Aortic valve mean gradient measures 3.0 mmHg. Aortic valve peak gradient measures 4.9 mmHg. Aortic valve area, by VTI measures 1.83 cm.  Pulmonic Valve: The pulmonic valve was not well visualized. Pulmonic valve regurgitation is trivial. No evidence of pulmonic stenosis.  Aorta: The aortic root and ascending aorta are structurally normal, with no evidence of dilitation.  IAS/Shunts: The atrial septum is grossly normal.   LEFT VENTRICLE PLAX 2D LVIDd:         3.10 cm LVIDs:         2.80 cm LV PW:         1.10 cm LV IVS:        1.10 cm LVOT diam:     2.00 cm LV SV:         38 LV SV Index:   21 LVOT Area:     3.14 cm  LV Volumes (MOD) LV vol d, MOD A2C: 50.5 ml LV vol d, MOD A4C: 53.9 ml LV vol s, MOD A2C: 21.2 ml LV vol s, MOD A4C: 18.4 ml LV SV MOD A2C:     29.3 ml LV SV MOD A4C:     53.9 ml LV SV MOD BP:      33.8 ml  RIGHT VENTRICLE RV Basal diam:  3.20 cm RV Mid diam:    2.60 cm TAPSE (M-mode): 1.7 cm  LEFT ATRIUM             Index        RIGHT ATRIUM           Index LA diam:        3.20 cm 1.77 cm/m   RA Area:     14.70 cm LA Vol (A2C):   41.4 ml 22.92 ml/m  RA Volume:   30.00 ml  16.61 ml/m LA Vol (A4C):   45.0 ml 24.91 ml/m LA Biplane Vol: 44.7 ml 24.75 ml/m AORTIC VALVE AV Area (Vmax):    2.12 cm AV Area (Vmean):   2.09 cm AV Area (VTI):     1.83 cm AV Vmax:           111.00 cm/s AV Vmean:          75.600 cm/s AV VTI:            0.206 m AV Peak Grad:      4.9 mmHg AV Mean Grad:  3.0 mmHg LVOT Vmax:         74.90 cm/s LVOT Vmean:        50.300 cm/s LVOT VTI:          0.120 m LVOT/AV VTI ratio: 0.58  AORTA Ao Root diam: 3.10 cm Ao Asc diam:  2.90 cm   SHUNTS Systemic VTI:  0.12 m Systemic Diam: 2.00 cm  Shelda Bruckner MD Electronically signed by Shelda Bruckner MD Signature Date/Time:  12/22/2022/1:04:18 PM    Final    MONITORS  LONG TERM MONITOR-LIVE TELEMETRY (3-14 DAYS) 03/21/2024  Narrative Patch Wear Time:  14 days and 0 hours (2025-07-18T13:57:58-0400 to 2025-08-01T13:57:58-0400)  Atrial Fibrillation occurred continuously (100% burden), ranging from 53-184 bpm (avg of 91 bpm).  Isolated VEs were rare (<1.0%), VE Couplets were rare (<1.0%), and no VE Triplets were present. Ventricular Bigeminy and Trigeminy were present.  with a max rate of 124 bpm (avg 120 bpm). 1 run of Ventricular Tachycardia occurred lasting 4 beats       ______________________________________________________________________________________________      Risk Assessment/Calculations  CHA2DS2-VASc Score = 7   This indicates a 11.2% annual risk of stroke. The patient's score is based upon: CHF History: 0 HTN History: 1 Diabetes History: 1 Stroke History: 2 Vascular Disease History: 1 Age Score: 2 Gender Score: 0         Physical Exam VS:  There were no vitals taken for this visit.   Wt Readings from Last 3 Encounters:  02/19/24 150 lb (68 kg)  01/08/24 148 lb (67.1 kg)  06/20/23 146 lb 6.2 oz (66.4 kg)    GEN: Well nourished, well developed in no acute distress NECK: No JVD; No carotid bruits CARDIAC: irregularly irregular, no murmurs, rubs, gallops RESPIRATORY:  Clear to auscultation without rales, wheezing or rhonchi  ABDOMEN: Soft, non-tender, non-distended EXTREMITIES:  No edema; No deformity   ASSESSMENT AND PLAN Syncope and collapse-lab monitor did not reveal any prolonged pauses.  No further episodes.  Encouraged him to increase his sodium intake, he is wearing his compression socks today, also encouraged him to increase his sodium intake.  CAD - s/p LHC in 2023 revealing Mid LM to Dist LM lesion is 40% stenosed with 70% stenosed side branch in Ost Cx to Prox Cx. Stable with no anginal symptoms. No indication for ischemic evaluation.  Currently on Eliquis  therefore  he is not on aspirin , continue metoprolol  25 mg in the evening, continue Crestor  20 mg in the evening, nitroglycerin  as needed.  HFmrEF-NYHA class I-II, appears to be euvolemic, but his proBNP was elevated, we previously decreased his Lasix , will change it to 10 mg on 1 day alternating with 20 mg the next day, not be a good candidate for SGLT2 inhibitor, not sure his blood pressure can tolerate much adjustment with his medications.  Permanent atrial fibrillation/hypercoagulable state - CV score of 7, currently on Eliquis  5 mg twice daily -- labs from PCP in April 2025 reveal HGB 13.5 and HCT 31.9, Creatinine 1.34 and he is currently on the correct dose of Eliquis .  Metoprolol  succinate 25 mg in the evening.  Hypertension -blood pressure is controlled 120/60, likely need to allow for permissive hypertension due to recent syncopal event and we may need to add midodrine in the future, but for now we will continue with supportive measures--compression socks, water, sodium.  Dyslipidemia - most recent LDL was well controlled at 35 in February 2025.            Dispo:  Follow-up in 3 months.  Signed, Delon JAYSON Hoover, NP  "

## 2024-04-29 ENCOUNTER — Encounter: Payer: Self-pay | Admitting: Cardiology

## 2024-04-29 ENCOUNTER — Ambulatory Visit: Attending: Cardiology | Admitting: Cardiology

## 2024-04-29 VITALS — BP 120/60 | HR 84 | Ht 66.0 in | Wt 151.0 lb

## 2024-04-29 DIAGNOSIS — I2510 Atherosclerotic heart disease of native coronary artery without angina pectoris: Secondary | ICD-10-CM | POA: Diagnosis not present

## 2024-04-29 DIAGNOSIS — R55 Syncope and collapse: Secondary | ICD-10-CM

## 2024-04-29 DIAGNOSIS — D6859 Other primary thrombophilia: Secondary | ICD-10-CM

## 2024-04-29 DIAGNOSIS — I5020 Unspecified systolic (congestive) heart failure: Secondary | ICD-10-CM

## 2024-04-29 DIAGNOSIS — I5022 Chronic systolic (congestive) heart failure: Secondary | ICD-10-CM

## 2024-04-29 DIAGNOSIS — I4821 Permanent atrial fibrillation: Secondary | ICD-10-CM | POA: Diagnosis not present

## 2024-04-29 DIAGNOSIS — E782 Mixed hyperlipidemia: Secondary | ICD-10-CM

## 2024-04-29 DIAGNOSIS — I10 Essential (primary) hypertension: Secondary | ICD-10-CM

## 2024-04-29 NOTE — Patient Instructions (Signed)
 Medication Instructions:   No changes  *If you need a refill on your cardiac medications before your next appointment, please call your pharmacy*  Lab Work:  None today  If you have labs (blood work) drawn today and your tests are completely normal, you will receive your results only by: MyChart Message (if you have MyChart) OR A paper copy in the mail If you have any lab test that is abnormal or we need to change your treatment, we will call you to review the results.  Testing/Procedures:   Follow-Up: At [facility], you and your health needs are our priority.  As part of our continuing mission to provide you with exceptional heart care, our providers are all part of one team.  This team includes your primary Cardiologist (physician) and Advanced Practice Providers or APPs (Physician Assistants and Nurse Practitioners) who all work together to provide you with the care you need, when you need it.  Your next appointment:   3 month(s)  Provider:   Lataya Varnell Crape, MD    We recommend signing up for the patient portal called MyChart.  Sign up information is provided on this After Visit Summary.  MyChart is used to connect with patients for Virtual Visits (Telemedicine).  Patients are able to view lab/test results, encounter notes, upcoming appointments, etc.  Non-urgent messages can be sent to your provider as well.   To learn more about what you can do with MyChart, go to [email or web address].   Other Instructions  Increase salt intake

## 2024-07-21 ENCOUNTER — Ambulatory Visit: Admitting: Cardiology
